# Patient Record
Sex: Female | Born: 1940
Health system: Southern US, Community
[De-identification: ages and names within clinical notes are randomized; demographics above are authoritative.]

## PROBLEM LIST (undated history)

## (undated) DIAGNOSIS — E079 Disorder of thyroid, unspecified: Secondary | ICD-10-CM

## (undated) DIAGNOSIS — H269 Unspecified cataract: Secondary | ICD-10-CM

## (undated) DIAGNOSIS — I10 Essential (primary) hypertension: Secondary | ICD-10-CM

## (undated) DIAGNOSIS — E785 Hyperlipidemia, unspecified: Secondary | ICD-10-CM

## (undated) HISTORY — DX: Disorder of thyroid, unspecified: E07.9

## (undated) HISTORY — DX: Hyperlipidemia, unspecified: E78.5

## (undated) HISTORY — PX: HERNIA REPAIR: SHX51

## (undated) HISTORY — DX: Unspecified cataract: H26.9

## (undated) HISTORY — DX: Essential (primary) hypertension: I10

## (undated) HISTORY — PX: APPENDECTOMY: SHX54

## (undated) HISTORY — PX: BLADDER SURGERY: SHX569

## (undated) HISTORY — PX: ABDOMINAL HYSTERECTOMY: SHX81

## (undated) HISTORY — PX: EYE SURGERY: SHX253

---

## 2004-10-14 ENCOUNTER — Ambulatory Visit: Payer: Self-pay | Admitting: Family Medicine

## 2004-11-19 ENCOUNTER — Ambulatory Visit: Payer: Self-pay | Admitting: Family Medicine

## 2005-05-08 ENCOUNTER — Ambulatory Visit: Payer: Self-pay | Admitting: Family Medicine

## 2005-08-05 ENCOUNTER — Ambulatory Visit: Payer: Self-pay | Admitting: Family Medicine

## 2005-11-10 ENCOUNTER — Ambulatory Visit: Payer: Self-pay | Admitting: Family Medicine

## 2006-02-09 ENCOUNTER — Ambulatory Visit: Payer: Self-pay | Admitting: Family Medicine

## 2006-08-10 ENCOUNTER — Ambulatory Visit: Payer: Self-pay | Admitting: Family Medicine

## 2006-08-11 ENCOUNTER — Ambulatory Visit: Payer: Self-pay | Admitting: Family Medicine

## 2006-10-01 ENCOUNTER — Ambulatory Visit: Payer: Self-pay | Admitting: Family Medicine

## 2007-03-01 ENCOUNTER — Ambulatory Visit: Payer: Self-pay | Admitting: Family Medicine

## 2019-06-13 ENCOUNTER — Other Ambulatory Visit: Payer: Self-pay

## 2019-06-14 ENCOUNTER — Ambulatory Visit (INDEPENDENT_AMBULATORY_CARE_PROVIDER_SITE_OTHER): Payer: Medicare Other | Admitting: Physician Assistant

## 2019-06-14 ENCOUNTER — Encounter: Payer: Self-pay | Admitting: Physician Assistant

## 2019-06-14 VITALS — BP 127/69 | HR 98 | Temp 99.4°F | Ht 66.0 in | Wt 174.8 lb

## 2019-06-14 DIAGNOSIS — E039 Hypothyroidism, unspecified: Secondary | ICD-10-CM

## 2019-06-14 DIAGNOSIS — I10 Essential (primary) hypertension: Secondary | ICD-10-CM | POA: Insufficient documentation

## 2019-06-14 DIAGNOSIS — E785 Hyperlipidemia, unspecified: Secondary | ICD-10-CM | POA: Insufficient documentation

## 2019-06-14 MED ORDER — LISINOPRIL-HYDROCHLOROTHIAZIDE 20-25 MG PO TABS: 0.5000 | ORAL_TABLET | Freq: Every day | ORAL | 3 refills | Status: DC

## 2019-06-14 NOTE — Progress Notes (Signed)
BP 127/69   Pulse 98   Temp 99.4 F (37.4 C) (Oral)   Ht 5\' 6"  (1.676 m)   Wt 174 lb 12.8 oz (79.3 kg)   BMI 28.21 kg/m    Subjective:    Patient ID: Hannah [name], female    DOB: 1941/05/05, 78 y.o.   MRN: [id number]  HPI: Hannah [name] is a 78 y.o. female presenting on 06/14/2019 for New Patient (Initial Visit) and Establish Care  Hannah [name] comes in as a new patient to be established with our practice.  She has been a longtime patient of Dr. Lysbeth Galas.  Her past medical history is positive for hyperlipidemia, hypothyroidism, hypertension.  All of her numbers have looked very good.  I have reviewed her labs from May and at that time they were quite good.  We will plan to recheck her again in November or December of this year.  Her past medical history is positive for hysterectomy, bladder tack and appendectomy.  She states that overall she does very well and does not have any difficulty with chest pain, shortness of breath, fatigue.  She sleeps well.  And she tries to stay active.  Past Medical History:  Diagnosis Date  . Hyperlipidemia   . Hypertension   . Thyroid disease    Relevant past medical, surgical, family and social history reviewed and updated as indicated. Interim medical history since our last visit reviewed. Allergies and medications reviewed and updated. DATA REVIEWED: CHART IN EPIC  Family History reviewed for pertinent findings.  Review of Systems  Constitutional: Negative.   HENT: Negative.   Eyes: Negative.   Respiratory: Negative.  Negative for shortness of breath and wheezing.   Gastrointestinal: Negative.   Genitourinary: Negative.   Musculoskeletal: Negative.   Psychiatric/Behavioral: Negative for decreased concentration, dysphoric mood and sleep disturbance. The patient is not nervous/anxious.     Allergies as of 06/14/2019      Reactions   Penicillins Rash   Sulfa Antibiotics Rash      Medication List       Accurate as of June 14, 2019 12:45  PM. If you have any questions, ask your nurse or doctor.        atorvastatin 10 MG tablet Commonly known as: LIPITOR Take 10 mg by mouth daily.   Calcium Carb-Cholecalciferol 600-400 MG-UNIT Tabs Take by mouth.   lisinopril-hydrochlorothiazide 20-25 MG tablet Commonly known as: ZESTORETIC Take 0.5 tablets by mouth daily.   Synthroid 75 MCG tablet Generic drug: levothyroxine TAKE ONE TABLET (75 MCG DOSE) BY MOUTH DAILY.          Objective:    BP 127/69   Pulse 98   Temp 99.4 F (37.4 C) (Oral)   Ht 5\' 6"  (1.676 m)   Wt 174 lb 12.8 oz (79.3 kg)   BMI 28.21 kg/m   Allergies  Allergen Reactions  . Penicillins Rash  . Sulfa Antibiotics Rash    Wt Readings from Last 3 Encounters:  06/14/19 174 lb 12.8 oz (79.3 kg)    Physical Exam Constitutional:      Appearance: She is well-developed.  HENT:     Head: Normocephalic and atraumatic.  Eyes:     Conjunctiva/sclera: Conjunctivae normal.     Pupils: Pupils are equal, round, and reactive to light.  Cardiovascular:     Rate and Rhythm: Normal rate and regular rhythm.     Heart sounds: Normal heart sounds.  Pulmonary:     Effort: Pulmonary effort is  normal.     Breath sounds: Normal breath sounds.  Abdominal:     General: Bowel sounds are normal.     Palpations: Abdomen is soft.  Skin:    General: Skin is warm and dry.     Findings: No rash.  Neurological:     Mental Status: She is alert and oriented to person, place, and time.     Deep Tendon Reflexes: Reflexes are normal and symmetric.  Psychiatric:        Behavior: Behavior normal.        Thought Content: Thought content normal.        Judgment: Judgment normal.     No results found for this or any previous visit.    Assessment & Plan:   1. Hyperlipidemia, unspecified hyperlipidemia type - CBC with Differential/Platelet; Future - CMP14+EGFR; Future - Lipid panel; Future - TSH; Future  2. Acquired hypothyroidism - TSH; Future  3. Essential  hypertension - CBC with Differential/Platelet; Future - CMP14+EGFR; Future   Continue all other maintenance medications as listed above.  Follow up plan: Return in about 5 months (around 11/14/2019).  Educational handout given for survey  Remus Loffler PA-C [facility] [address]  [place], [place] [address] [phone]   06/14/2019, 12:45 PM

## 2019-06-20 ENCOUNTER — Ambulatory Visit
Admission: EM | Admit: 2019-06-20 | Discharge: 2019-06-20 | Disposition: A | Discharge status: Home / Self Care | Payer: Medicare Other | Source: Home / Self Care

## 2019-06-20 ENCOUNTER — Emergency Department (HOSPITAL_COMMUNITY): Payer: Medicare Other

## 2019-06-20 ENCOUNTER — Observation Stay (HOSPITAL_COMMUNITY): Payer: Medicare Other

## 2019-06-20 ENCOUNTER — Encounter (HOSPITAL_COMMUNITY): Payer: Self-pay

## 2019-06-20 ENCOUNTER — Inpatient Hospital Stay (HOSPITAL_COMMUNITY)
Admission: EM | Admit: 2019-06-20 | Discharge: 2019-06-25 | DRG: 354 | Disposition: A | Discharge status: Home Health | Payer: Medicare Other | Attending: Internal Medicine | Admitting: Internal Medicine

## 2019-06-20 ENCOUNTER — Other Ambulatory Visit: Payer: Self-pay

## 2019-06-20 DIAGNOSIS — I959 Hypotension, unspecified: Secondary | ICD-10-CM | POA: Diagnosis present

## 2019-06-20 DIAGNOSIS — K429 Umbilical hernia without obstruction or gangrene: Secondary | ICD-10-CM

## 2019-06-20 DIAGNOSIS — R109 Unspecified abdominal pain: Secondary | ICD-10-CM

## 2019-06-20 DIAGNOSIS — Z20828 Contact with and (suspected) exposure to other viral communicable diseases: Secondary | ICD-10-CM | POA: Diagnosis present

## 2019-06-20 DIAGNOSIS — E876 Hypokalemia: Secondary | ICD-10-CM | POA: Diagnosis present

## 2019-06-20 DIAGNOSIS — Z9049 Acquired absence of other specified parts of digestive tract: Secondary | ICD-10-CM

## 2019-06-20 DIAGNOSIS — Z880 Allergy status to penicillin: Secondary | ICD-10-CM

## 2019-06-20 DIAGNOSIS — Z90710 Acquired absence of both cervix and uterus: Secondary | ICD-10-CM

## 2019-06-20 DIAGNOSIS — N179 Acute kidney failure, unspecified: Secondary | ICD-10-CM | POA: Diagnosis not present

## 2019-06-20 DIAGNOSIS — K56609 Unspecified intestinal obstruction, unspecified as to partial versus complete obstruction: Secondary | ICD-10-CM

## 2019-06-20 DIAGNOSIS — Z79890 Hormone replacement therapy (postmenopausal): Secondary | ICD-10-CM

## 2019-06-20 DIAGNOSIS — I10 Essential (primary) hypertension: Secondary | ICD-10-CM | POA: Diagnosis present

## 2019-06-20 DIAGNOSIS — E785 Hyperlipidemia, unspecified: Secondary | ICD-10-CM | POA: Diagnosis present

## 2019-06-20 DIAGNOSIS — Z8249 Family history of ischemic heart disease and other diseases of the circulatory system: Secondary | ICD-10-CM

## 2019-06-20 DIAGNOSIS — E039 Hypothyroidism, unspecified: Secondary | ICD-10-CM | POA: Diagnosis present

## 2019-06-20 DIAGNOSIS — K420 Umbilical hernia with obstruction, without gangrene: Principal | ICD-10-CM | POA: Diagnosis present

## 2019-06-20 DIAGNOSIS — R0902 Hypoxemia: Secondary | ICD-10-CM | POA: Diagnosis present

## 2019-06-20 DIAGNOSIS — R1032 Left lower quadrant pain: Secondary | ICD-10-CM

## 2019-06-20 DIAGNOSIS — Z882 Allergy status to sulfonamides status: Secondary | ICD-10-CM

## 2019-06-20 DIAGNOSIS — Z79899 Other long term (current) drug therapy: Secondary | ICD-10-CM

## 2019-06-20 DIAGNOSIS — Z823 Family history of stroke: Secondary | ICD-10-CM

## 2019-06-20 LAB — COMPREHENSIVE METABOLIC PANEL
ALT: 18 U/L (ref 0–44)
AST: 26 U/L (ref 15–41)
Albumin: 5 g/dL (ref 3.5–5.0)
Alkaline Phosphatase: 79 U/L (ref 38–126)
Anion gap: 15 (ref 5–15)
BUN: 47 mg/dL — ABNORMAL HIGH (ref 8–23)
CO2: 30 mmol/L (ref 22–32)
Calcium: 10.7 mg/dL — ABNORMAL HIGH (ref 8.9–10.3)
Chloride: 96 mmol/L — ABNORMAL LOW (ref 98–111)
Creatinine, Ser: 2.9 mg/dL — ABNORMAL HIGH (ref 0.44–1.00)
GFR calc Af Amer: 17 mL/min — ABNORMAL LOW (ref >60)
GFR calc non Af Amer: 15 mL/min — ABNORMAL LOW (ref >60)
Glucose, Bld: 168 mg/dL — ABNORMAL HIGH (ref 70–99)
Potassium: 3.5 mmol/L (ref 3.5–5.1)
Sodium: 141 mmol/L (ref 135–145)
Total Bilirubin: 1.5 mg/dL — ABNORMAL HIGH (ref 0.3–1.2)
Total Protein: 8.7 g/dL — ABNORMAL HIGH (ref 6.5–8.1)

## 2019-06-20 LAB — CBC
HCT: 47.8 % — ABNORMAL HIGH (ref 36.0–46.0)
Hemoglobin: 15.8 g/dL — ABNORMAL HIGH (ref 12.0–15.0)
MCH: 29 pg (ref 26.0–34.0)
MCHC: 33.1 g/dL (ref 30.0–36.0)
MCV: 87.7 fL (ref 80.0–100.0)
Platelets: 254 10*3/uL (ref 150–400)
RBC: 5.45 MIL/uL — ABNORMAL HIGH (ref 3.87–5.11)
RDW: 14.5 % (ref 11.5–15.5)
WBC: 11.1 10*3/uL — ABNORMAL HIGH (ref 4.0–10.5)
nRBC: 0 % (ref 0.0–0.2)

## 2019-06-20 LAB — LIPASE, BLOOD: Lipase: 30 U/L (ref 11–51)

## 2019-06-20 LAB — SARS CORONAVIRUS 2 BY RT PCR (HOSPITAL ORDER, PERFORMED IN ~~LOC~~ HOSPITAL LAB): SARS Coronavirus 2: NEGATIVE

## 2019-06-20 MED ORDER — SODIUM CHLORIDE 0.9 % IV BOLUS
500.0000 mL | Freq: Once | INTRAVENOUS | Status: AC
  Administered 2019-06-20: 21:00:00 500 mL via INTRAVENOUS

## 2019-06-20 MED ORDER — MORPHINE SULFATE (PF) 2 MG/ML IV SOLN
2.0000 mg | INTRAVENOUS | Status: DC | PRN
  Administered 2019-06-21 – 2019-06-25 (×7): 2 mg via INTRAVENOUS
  Filled 2019-06-20 (×7): qty 1

## 2019-06-20 MED ORDER — KCL IN DEXTROSE-NACL 20-5-0.9 MEQ/L-%-% IV SOLN
INTRAVENOUS | Status: DC
  Administered 2019-06-21 – 2019-06-23 (×5): via INTRAVENOUS
  Filled 2019-06-20 (×2): qty 1000

## 2019-06-20 MED ORDER — SODIUM CHLORIDE 0.9 % IV BOLUS
500.0000 mL | Freq: Once | INTRAVENOUS | Status: AC
  Administered 2019-06-20: 500 mL via INTRAVENOUS

## 2019-06-20 MED ORDER — TECHNETIUM TO 99M ALBUMIN AGGREGATED
1.6000 | Freq: Once | INTRAVENOUS | Status: AC | PRN
  Administered 2019-06-20: 1.6 via INTRAVENOUS

## 2019-06-20 MED ORDER — ACETAMINOPHEN 325 MG PO TABS: 650.0000 mg | ORAL_TABLET | Freq: Four times a day (QID) | ORAL | Status: DC | PRN

## 2019-06-20 MED ORDER — SODIUM CHLORIDE 0.9% FLUSH: 3.0000 mL | Freq: Once | INTRAVENOUS | Status: DC

## 2019-06-20 MED ORDER — HEPARIN (PORCINE) 25000 UT/250ML-% IV SOLN
1050.0000 [IU]/h | INTRAVENOUS | Status: AC
  Administered 2019-06-20: 22:00:00 1200 [IU]/h via INTRAVENOUS
  Administered 2019-06-21: 1050 [IU]/h via INTRAVENOUS
  Filled 2019-06-20 (×2): qty 250

## 2019-06-20 MED ORDER — FENTANYL CITRATE (PF) 100 MCG/2ML IJ SOLN: 50.0000 ug | Freq: Once | INTRAMUSCULAR | Status: DC

## 2019-06-20 MED ORDER — ONDANSETRON HCL 4 MG PO TABS
4.0000 mg | ORAL_TABLET | Freq: Four times a day (QID) | ORAL | Status: DC | PRN
  Administered 2019-06-24: 4 mg via ORAL
  Filled 2019-06-20 (×2): qty 1

## 2019-06-20 MED ORDER — ONDANSETRON HCL 4 MG/2ML IJ SOLN
4.0000 mg | Freq: Once | INTRAMUSCULAR | Status: AC
  Administered 2019-06-20: 4 mg via INTRAVENOUS
  Filled 2019-06-20: qty 2

## 2019-06-20 MED ORDER — ACETAMINOPHEN 650 MG RE SUPP: 650.0000 mg | Freq: Four times a day (QID) | RECTAL | Status: DC | PRN

## 2019-06-20 MED ORDER — HEPARIN BOLUS VIA INFUSION
4000.0000 [IU] | Freq: Once | INTRAVENOUS | Status: AC
  Administered 2019-06-20: 22:00:00 4000 [IU] via INTRAVENOUS

## 2019-06-20 MED ORDER — FENTANYL CITRATE (PF) 100 MCG/2ML IJ SOLN
50.0000 ug | Freq: Once | INTRAMUSCULAR | Status: AC
  Administered 2019-06-20: 50 ug via INTRAVENOUS
  Filled 2019-06-20: qty 2

## 2019-06-20 MED ORDER — IOHEXOL 300 MG/ML  SOLN
30.0000 mL | Freq: Once | INTRAMUSCULAR | Status: AC | PRN
  Administered 2019-06-20: 18:00:00 30 mL via ORAL

## 2019-06-20 MED ORDER — ONDANSETRON HCL 4 MG/2ML IJ SOLN
4.0000 mg | Freq: Four times a day (QID) | INTRAMUSCULAR | Status: DC | PRN
  Administered 2019-06-21 – 2019-06-23 (×5): 4 mg via INTRAVENOUS
  Filled 2019-06-20 (×7): qty 2

## 2019-06-20 MED ORDER — SODIUM CHLORIDE 0.9 % IV SOLN
Freq: Once | INTRAVENOUS | Status: AC
  Administered 2019-06-20: 20:00:00 via INTRAVENOUS

## 2019-06-20 NOTE — Discharge Instructions (Addendum)
Recommending further evaluation and management in the ED.  Cannot rule out incarcerated hernia, bowel obstruction, or GI bleed in office.  Patient and family declines EMS transport.  Will go by private vehicle to [facility] ED.  Pt stable at discharge.

## 2019-06-20 NOTE — ED Notes (Signed)
Pt lips are mildly cyanotic, pulse ox 90%. Pt placed on 2L [place]. [place] PA is speaking with family.

## 2019-06-20 NOTE — ED Notes (Signed)
Patient is being discharged from the Urgent [place] and sent to the Emergency Department by family member. EMS offered, family and patient declined. Per [place] PA, patient is stable but in need of higher level of care due to abdominal pain, low oxygen levels, emesis, and no bowel movement x 5 days. Patient is aware and verbalizes understanding of plan of care.  Vitals:   06/20/19 1350 06/20/19 1358  BP: 95/64   Pulse: (!) 125   Resp: 19   Temp: 98.5 F (36.9 C)   SpO2: 93% 95%

## 2019-06-20 NOTE — ED Notes (Signed)
Pt states she does not need fentanyl at this time.

## 2019-06-20 NOTE — ED Notes (Signed)
Patient's family notified that patient is going to be admitted.

## 2019-06-20 NOTE — ED Notes (Signed)
Admitting physician reduced oxygen to 2 liters by nasal cannula. Patient's oxygen saturation is at 97 percent on 2 liters

## 2019-06-20 NOTE — ED Provider Notes (Signed)
  [place]   [id number] 06/20/19 Arrival Time: 7741  Abbreviated note:    CC: ABDOMINAL pain  SUBJECTIVE:  Hannah [name] is a 78 y.o. female hx significant for HLD, HTN, and thyroid disease, who presents with complaint of abdominal pain and distention x 5 days.  Denies a precipitating event, or trauma.  Localizes pain to LLQ. Describes as stabbing, and 8/10.  Has tried OTC medications without relief.  Symptoms made worse with eating/ drinking.  Last BM 5 days ago. Complains of nausea, vomiting.    Denies chest pain or SOB.    No LMP recorded. Patient has had a hysterectomy.  ROS: As per HPI.  OBJECTIVE:  Vitals:   06/20/19 1350 06/20/19 1358  BP: 95/64   Pulse: (!) 125   Resp: 19   Temp: 98.5 F (36.9 C)   SpO2: 93% 95%    General appearance: Alert; appears uncomfortable HEENT: NCAT. Lips mildly cyanotic.  Oropharynx clear. Voice is hoarse; voice and O2 stats improved after 2L of oxygen by [place] Lungs: clear to auscultation bilaterally without adventitious breath sounds Heart: tachycardia, regular rhythm Abdomen: distended; TTP over lower abdomen; umbilical hernia with discoloration; mild guarding Extremities: no edema; symmetrical with no gross deformities Skin: warm and dry Neurologic: normal gait Psychological: alert and cooperative; normal mood and affect  ASSESSMENT & PLAN:  1. Left lower quadrant abdominal pain   2. Umbilical hernia without obstruction and without gangrene    Recommending further evaluation and management in the ED.  Cannot rule out incarcerated hernia, bowel obstruction, or GI bleed in office.  Patient and family declines EMS transport.  Will go by private vehicle to [facility] ED.  Pt stable at discharge.      Lestine Box, PA-C 06/20/19 1421

## 2019-06-20 NOTE — ED Triage Notes (Signed)
Pt sent to ED from Urgent Care for evaluation of mid abdominal pain. Pt was seen today and diagnosed with umbilical hernia but could not rule out bowel obstruction or GI bleed. Pt with complaints of abdominal pain, vomiting and no bowel movement in 5 days. Pt abdomen noted to be distended.

## 2019-06-20 NOTE — Progress Notes (Signed)
ANTICOAGULATION CONSULT NOTE - Initial Consult  Pharmacy Consult for heparin Indication: potential PE  Allergies  Allergen Reactions  . Penicillins Rash    Did it involve swelling of the face/tongue/throat, SOB, or low BP? Unknown Did it involve sudden or severe rash/hives, skin peeling, or any reaction on the inside of your mouth or nose? Janifer Adie Did you need to seek medical attention at a hospital or doctor's office? No When did it last happen?childhood If all above answers are "NO", may proceed with cephalosporin use.   . Sulfa Antibiotics Rash    Patient Measurements: Height: 5\' 6"  (167.6 cm) Weight: 174 lb (78.9 kg) IBW/kg (Calculated) : 59.3 Heparin Dosing Weight: 75 kg   Vital Signs: Temp: 98.4 F (36.9 C) (07/20 1423) Temp Source: Oral (07/20 1423) BP: 94/47 (07/20 2100) Pulse Rate: 97 (07/20 2120)  Labs: Recent Labs    06/20/19 1516  HGB 15.8*  HCT 47.8*  PLT 254  CREATININE 2.90*    Estimated Creatinine Clearance: 16.9 mL/min (A) (by C-G formula based on SCr of 2.9 mg/dL (H)).   Medical History: Past Medical History:  Diagnosis Date  . Hyperlipidemia   . Hypertension   . Thyroid disease     Medications:  (Not in a hospital admission)   Assessment: Pharmacy consulted to dose heparin in patient with potential PE (VQ scan pending).  Patient not on anticoagulation prior to admission. Goal of Therapy:  Heparin level 0.3-0.7 units/ml Monitor platelets by anticoagulation protocol: Yes   Plan:  Give 4000 units bolus x 1 Start heparin infusion at 1200 units/hr Check anti-Xa level in 8 hours and daily while on heparin Continue to monitor H&H and platelets  Ramond Craver 06/20/2019,9:32 PM

## 2019-06-20 NOTE — ED Notes (Signed)
As reported by day time RN urgent care center reported patient's sats at 90-91 percent . Patient arrived to the ED with sats on room air in the mid 80 range. Patient was hypoxic and lips were blue in color. Hypoxia resolved after patient was placed on 4 liters nasal cannula.

## 2019-06-20 NOTE — ED Notes (Signed)
Hourly rounding complete. Patient states that she is not having any pain but was having nausea and dry heaving,. Patient was given standing order for zofran.

## 2019-06-20 NOTE — ED Notes (Signed)
Report given to Diamond at [facility] ED.

## 2019-06-20 NOTE — ED Notes (Signed)
Patient is being discharged to the ED by family. Pt and family offered EMS transport. They declined requesting that they transport her directly to the ED in personal vehicle.

## 2019-06-20 NOTE — ED Triage Notes (Addendum)
Pt presents with complaints of abdominal pain, emesis and no bowel movement x 5 days. Pts abdomen is distended. Area around umbilicus is swollen and red, patient denies hx of hernia to her knowledge. [place] PA notified of complaints and is at bedside.  Pt's voice is hoarse, states she woke up with that this morning. Denies any cough, fever or shortness of breath.

## 2019-06-20 NOTE — ED Provider Notes (Addendum)
[facility]GENCY [facility]PART[facility] Provider Note   CSN: [id number] Arrival date & time: 06/20/19  1413    History   Chief Complaint Chief Complaint  Patient presents with  . Abdominal Pain    HPI Hannah [name] is a 78 y.o. female.     Generalized abdominal pain for 2 weeks, worse this weekend, localizing in the left lower quadrant with associated bulging in the periumbilical area.  Past surgical history includes appendectomy and hysterectomy.  No previous renal problems.  She has been nauseated.  Pulse ox noted to be 74% on room air.  No frank dyspnea, chest pain, cough, fever, sweats, chills, dysuria, bloody stool.  Severity is moderate.  Palpation makes symptoms worse     Past Medical History:  Diagnosis Date  . Hyperlipidemia   . Hypertension   . Thyroid disease     Patient Active Problem List   Diagnosis Date Noted  . Hyperlipidemia 06/14/2019  . Acquired hypothyroidism 06/14/2019  . Essential hypertension 06/14/2019    Past Surgical History:  Procedure Laterality Date  . ABDOMINAL HYSTERECTOMY    . APPENDECTOMY    . BLADDER SURGERY    . EYE SURGERY       OB History   No obstetric history on file.      Home Medications    Prior to Admission medications   Medication Sig Start Date End Date Taking? Authorizing Provider  atorvastatin (LIPITOR) 10 MG tablet Take 10 mg by mouth every morning.  03/26/19  Yes [provider]  Calcium Carb-Cholecalciferol 600-400 MG-UNIT TABS Take 1 tablet by mouth daily.    Yes [provider]  lisinopril-hydrochlorothiazide (ZESTORETIC) 20-25 MG tablet Take 0.5 tablets by mouth daily. 06/14/19  Yes Prudy FeelerJones, Angel S, PA-C  SYNTHROID 75 MCG tablet Take 75 mcg by mouth every morning.  03/16/19  Yes [provider]    Family History Family History  Problem Relation Age of Onset  . Stroke Mother   . Heart disease Mother   . Heart disease Father   . Stroke Father   . Cancer Father   . Cancer Sister    . Aneurysm Brother     Social History Social History   Tobacco Use  . Smoking status: Never Smoker  . Smokeless tobacco: Never Used  Substance Use Topics  . Alcohol use: Never    Frequency: Never  . Drug use: Never     Allergies   Penicillins and Sulfa antibiotics   Review of Systems Review of Systems  All other systems reviewed and are negative.    Physical Exam Updated Vital Signs BP (!) 118/55 (BP Location: Right Arm)   Pulse (!) 102   Temp 98.4 F (36.9 C) (Oral)   Resp 18   Ht 5\' 6"  (1.676 m)   Wt 78.9 kg   SpO2 95%   BMI 28.08 kg/m   Physical Exam Vitals signs and nursing note reviewed.  Constitutional:      Appearance: She is well-developed.  HENT:     Head: Normocephalic and atraumatic.  Eyes:     Conjunctiva/sclera: Conjunctivae normal.  Neck:     Musculoskeletal: Neck supple.  Cardiovascular:     Rate and Rhythm: Normal rate and regular rhythm.  Pulmonary:     Effort: Pulmonary effort is normal.     Breath sounds: Normal breath sounds.  Abdominal:     General: Bowel sounds are normal.     Palpations: Abdomen is soft.     Comments: Abdominal  protuberance; most tender in the left lower quadrant; suspected periumbilical hernia (unable to reduce manually)  Musculoskeletal: Normal range of motion.  Skin:    General: Skin is warm and dry.  Neurological:     Mental Status: She is alert and oriented to person, place, and time.  Psychiatric:        Behavior: Behavior normal.      ED Treatments / Results  Labs (all labs ordered are listed, but only abnormal results are displayed) Labs Reviewed  COMPREHENSIVE METABOLIC PANEL - Abnormal; Notable for the following components:      Result Value   Chloride 96 (*)    Glucose, Bld 168 (*)    BUN 47 (*)    Creatinine, Ser 2.90 (*)    Calcium 10.7 (*)    Total Protein 8.7 (*)    Total Bilirubin 1.5 (*)    GFR calc non Af Amer 15 (*)    GFR calc Af Amer 17 (*)    All other components within  normal limits  CBC - Abnormal; Notable for the following components:   WBC 11.1 (*)    RBC 5.45 (*)    Hemoglobin 15.8 (*)    HCT 47.8 (*)    All other components within normal limits  SARS CORONAVIRUS 2 (HOSPITAL ORDER, PERFORMED IN [facility])  LIPASE, BLOOD  URINALYSIS, ROUTINE W REFLEX MICROSCOPIC    EKG None  Radiology Ct Abdomen Pelvis Wo Contrast  Result Date: 06/20/2019 CLINICAL DATA:  Abdominal pain for several days EXAM: CT ABDOMEN AND PELVIS WITHOUT CONTRAST TECHNIQUE: Multidetector CT imaging of the abdomen and pelvis was performed following the standard protocol without IV contrast. COMPARISON:  None. FINDINGS: Lower chest: No acute abnormality. Hepatobiliary: Liver is well visualized within normal limits. The gallbladder is well distended with 2 heavily calcified gallstones. No wall thickening or pericholecystic fluid is noted. Pancreas: Unremarkable. No pancreatic ductal dilatation or surrounding inflammatory changes. Spleen: Normal in size without focal abnormality. Adrenals/Urinary Tract: Adrenal glands are within normal limits. Kidneys are well visualized bilaterally. No renal calculi or obstructive changes are seen. The bladder is well distended. Stomach/Bowel: Diverticular change of the colon is identified without inflammatory change. The colon is predominately decompressed however. The appendix has been surgically removed. There is an incarcerated loop of small bowel identified within an umbilical hernia with proximal small bowel dilatation extending from the stomach to the level of the hernia. The small bowel distal to the hernia is completely decompressed comprising almost the entire ileum. Vascular/Lymphatic: Aortic atherosclerosis. No enlarged abdominal or pelvic lymph nodes. Reproductive: Status post hysterectomy. No adnexal masses. Other: No abdominal wall hernia or abnormality. No abdominopelvic ascites. Musculoskeletal: No acute or significant osseous  findings. IMPRESSION: Incarcerated umbilical hernia containing a loop of small bowel likely near the junction of the jejunum and ileum with proximal small bowel obstruction. Cholelithiasis without complicating factors. Diverticulosis without diverticulitis. Electronically Signed   By: Alcide CleverMark  Lukens M.D.   On: 06/20/2019 18:17   Dg Chest Port 1 View  Result Date: 06/20/2019 CLINICAL DATA:  Abdominal pain.  Low O2 saturation. EXAM: PORTABLE CHEST 1 VIEW COMPARISON:  None. FINDINGS: The heart size and mediastinal contours are within normal limits. Both lungs are clear. The visualized skeletal structures are unremarkable. IMPRESSION: Normal exam. Electronically Signed   By: Francene BoyersJames  Maxwell M.D.   On: 06/20/2019 17:48    Procedures Hernia reduction  Date/Time: 06/20/2019 7:51 PM Performed by: Donnetta Hutchingook, Kischa Altice, MD Authorized by: Donnetta Hutchingook, Jarae Panas, MD  Consent: Verbal consent obtained. Risks and benefits: risks, benefits and alternatives were discussed Consent given by: patient Patient understanding: patient states understanding of the procedure being performed Test results: test results available and properly labeled Imaging studies: imaging studies available Patient identity confirmed: verbally with patient Local anesthesia used: no  Anesthesia: Local anesthesia used: no  Sedation: Patient sedated: yes Sedatives: fentanyl Analgesia: fentanyl Vitals: Vital signs were monitored during sedation.  Patient tolerance: patient tolerated the procedure well with no immediate complications Comments: Periumbilical hernia was reduced using general manual pressure.  Patient tolerated the procedure well.    (including critical care time)  Medications Ordered in ED Medications  sodium chloride flush (NS) 0.9 % injection 3 mL (3 mLs Intravenous Not Given 06/20/19 1541)  fentaNYL (SUBLIMAZE) injection 50 mcg (0 mcg Intravenous Hold 06/20/19 1915)  sodium chloride 0.9 % bolus 500 mL (has no administration in time  range)  0.9 %  sodium chloride infusion (has no administration in time range)  fentaNYL (SUBLIMAZE) injection 50 mcg (50 mcg Intravenous Given 06/20/19 1655)  ondansetron (ZOFRAN) injection 4 mg (4 mg Intravenous Given 06/20/19 1655)  iohexol (OMNIPAQUE) 300 MG/ML solution 30 mL (30 mLs Oral Contrast Given 06/20/19 1811)     Initial Impression / Assessment and Plan / ED Course  I have reviewed the triage vital signs and the nursing notes.  Pertinent labs & imaging results that were available during my care of the patient were reviewed by me and considered in my medical decision making (see chart for details).        Abdominal pain and swelling worse in the left lower quadrant.  Suspect bowel obstruction and possible hernia.  CT pending.  Uncertain etiology of low pulse ox.  Could be lab error.  Chest x-ray negative.  Creatinine 2.9  1820: CT reveals an incarcerated umbilical hernia with small bowel obstruction.  Will consult general surgery and admit.  8338: Periumbilical hernia reduced with gentle pressure.  CRITICAL CARE Performed by: Nat Christen Total critical care time: 30 minutes Critical care time was exclusive of separately billable procedures and treating other patients. Critical care was necessary to treat or prevent imminent or life-threatening deterioration. Critical care was time spent personally by me on the following activities: development of treatment plan with patient and/or surrogate as well as nursing, discussions with consultants, evaluation of patient's response to treatment, examination of patient, obtaining history from patient or surrogate, ordering and performing treatments and interventions, ordering and review of laboratory studies, ordering and review of radiographic studies, pulse oximetry and re-evaluation of patient's condition.  Final Clinical Impressions(s) / ED Diagnoses   Final diagnoses:  Abdominal pain, unspecified abdominal location  AKI (acute  kidney injury) ([place])  Incarcerated umbilical hernia  Small bowel obstruction [facility])    ED Discharge Orders    None       Nat Christen, MD 06/20/19 Isaias Cowman, MD 06/20/19 Kathaleen Maser    Nat Christen, MD 06/20/19 2026

## 2019-06-20 NOTE — ED Notes (Signed)
Pt attempting to drink po contrast. Denies any nausea, however vomits after about 286ml. MD made aware and decision to do scan without contrast made.

## 2019-06-20 NOTE — ED Notes (Signed)
Placed pt on monitor and sats noted to be 74% on room air. Pt denies any shortness of breath at this time other than feeling smothered by mask at times. Good pleth with this sat. Placed on 2L [place] and sats improved to 90%. MD made aware.

## 2019-06-20 NOTE — H&P (Signed)
History and Physical    Hannah [name] JXB:[id number] DOB: 05/12/1941 DOA: 06/20/2019  PCP: Remus Loffler, PA-C   Patient coming from: Home  I have personally briefly reviewed patient's old medical records in [facility]  Chief Complaint: Abdominal Pain, vomiting  HPI: Hannah [name] is a 78 y.o. female with medical history significant for hypertension, hyperlipidemia hypothyroidism who presented to the ED with complaints of abdominal pain and multiple episodes of vomiting over the past 5 days.  Patient last bowel movement was 5 days ago.  No fever no chills. She also reports a chronic intermittent dry cough that is unchanged, she denies difficulty breathing, chest pain.  She never smoked cigarettes.  Denies lower extremity swelling.  No history of blood clots in lungs or legs.  She is fairly active, lives alone, mows her own lawn. Daughter is an Nutritional therapist here at [employer]. Patient initially presented at the urgent care and was subsequently referred to the ED.  ED Course: Temperature 98.4, initial tachycardia to 122, intermittent tachypnea to 32, O2 sats 75% on room air, patient was placed on 4 L nasal cannula with improvement in O2 sats to greater than 94%.  WBC 11.  Potassium 3.4.  Elevated creatinine 2.9.  Chest x-ray negative for acute abnormality.  CT abdomen and pelvis without contrast-accelerated umbilical hernia containing a loop of small bowel likely near the junction of the jejunum and ileum with proximal small bowel obstruction.  Umbilical hernia was manually reduced in the ED. EDP talked to Dr. Lovell Sheehan who will see patient in the morning, recommended hospitalist admission.  Review of Systems: As per HPI all other systems reviewed and negative.  Past Medical History:  Diagnosis Date   Hyperlipidemia    Hypertension    Thyroid disease     Past Surgical History:  Procedure Laterality Date   ABDOMINAL HYSTERECTOMY     APPENDECTOMY     BLADDER SURGERY     EYE  SURGERY       reports that she has never smoked. She has never used smokeless tobacco. She reports that she does not drink alcohol or use drugs.  Allergies  Allergen Reactions   Penicillins Rash    Did it involve swelling of the face/tongue/throat, SOB, or low BP? Unknown Did it involve sudden or severe rash/hives, skin peeling, or any reaction on the inside of your mouth or nose? Ruta Hinds Did you need to seek medical attention at a hospital or doctor's office? No When did it last happen?childhood If all above answers are NO, may proceed with cephalosporin use.    Sulfa Antibiotics Rash    Family History  Problem Relation Age of Onset   Stroke Mother    Heart disease Mother    Heart disease Father    Stroke Father    Cancer Father    Cancer Sister    Aneurysm Brother     Prior to Admission medications   Medication Sig Start Date End Date Taking? Authorizing Provider  atorvastatin (LIPITOR) 10 MG tablet Take 10 mg by mouth every morning.  03/26/19  Yes [provider]  Calcium Carb-Cholecalciferol 600-400 MG-UNIT TABS Take 1 tablet by mouth daily.    Yes [provider]  lisinopril-hydrochlorothiazide (ZESTORETIC) 20-25 MG tablet Take 0.5 tablets by mouth daily. 06/14/19  Yes Prudy Feeler S, PA-C  SYNTHROID 75 MCG tablet Take 75 mcg by mouth every morning.  03/16/19  Yes [provider]    Physical Exam: Vitals:   06/20/19  1920 06/20/19 1930 06/20/19 2000 06/20/19 2030  BP:  93/74 (!) 108/53 (!) 122/58  Pulse: (!) 102 (!) 106 (!) 102 96  Resp: 18 (!) 23 (!) 27 (!) 23  Temp:      TempSrc:      SpO2: 95% 95% 96% 94%  Weight:      Height:        Constitutional: NAD, calm, comfortable Vitals:   06/20/19 1920 06/20/19 1930 06/20/19 2000 06/20/19 2030  BP:  93/74 (!) 108/53 (!) 122/58  Pulse: (!) 102 (!) 106 (!) 102 96  Resp: 18 (!) 23 (!) 27 (!) 23  Temp:      TempSrc:      SpO2: 95% 95% 96% 94%  Weight:      Height:        Eyes: PERRL, lids and conjunctivae normal ENMT: Mucous membranes are moist. Posterior pharynx clear of any exudate or lesions.Normal dentition.  Neck: normal, supple, no masses, no thyromegaly Respiratory: clear to auscultation bilaterally, no wheezing, no crackles. Normal respiratory effort. No accessory muscle use.  Cardiovascular: Regular rate and rhythm, no murmurs / rubs / gallops. No extremity edema. 2+ pedal pulses. No carotid bruits.  Abdomen: Mild to moderate abdominal tenderness on palpation mostly periumbilical area,  no masses palpated. No hepatosplenomegaly. Bowel sounds positive.  Musculoskeletal: no clubbing / cyanosis. No joint deformity upper and lower extremities. Good ROM, no contractures. Normal muscle tone.  Skin: no rashes, lesions, ulcers. No induration Neurologic: CN 2-12 grossly intact.  Strength 5/5 in all 4.  Psychiatric: Normal judgment and insight. Alert and oriented x 3. Normal mood.   Labs on Admission: I have personally reviewed following labs and imaging studies  CBC: Recent Labs  Lab 06/20/19 1516  WBC 11.1*  HGB 15.8*  HCT 47.8*  MCV 87.7  PLT 254   Basic Metabolic Panel: Recent Labs  Lab 06/20/19 1516  NA 141  K 3.5  CL 96*  CO2 30  GLUCOSE 168*  BUN 47*  CREATININE 2.90*  CALCIUM 10.7*   Liver Function Tests: Recent Labs  Lab 06/20/19 1516  AST 26  ALT 18  ALKPHOS 79  BILITOT 1.5*  PROT 8.7*  ALBUMIN 5.0   Recent Labs  Lab 06/20/19 1516  LIPASE 30    Radiological Exams on Admission: Ct Abdomen Pelvis Wo Contrast  Result Date: 06/20/2019 CLINICAL DATA:  Abdominal pain for several days EXAM: CT ABDOMEN AND PELVIS WITHOUT CONTRAST TECHNIQUE: Multidetector CT imaging of the abdomen and pelvis was performed following the standard protocol without IV contrast. COMPARISON:  None. FINDINGS: Lower chest: No acute abnormality. Hepatobiliary: Liver is well visualized within normal limits. The gallbladder is well distended with 2  heavily calcified gallstones. No wall thickening or pericholecystic fluid is noted. Pancreas: Unremarkable. No pancreatic ductal dilatation or surrounding inflammatory changes. Spleen: Normal in size without focal abnormality. Adrenals/Urinary Tract: Adrenal glands are within normal limits. Kidneys are well visualized bilaterally. No renal calculi or obstructive changes are seen. The bladder is well distended. Stomach/Bowel: Diverticular change of the colon is identified without inflammatory change. The colon is predominately decompressed however. The appendix has been surgically removed. There is an incarcerated loop of small bowel identified within an umbilical hernia with proximal small bowel dilatation extending from the stomach to the level of the hernia. The small bowel distal to the hernia is completely decompressed comprising almost the entire ileum. Vascular/Lymphatic: Aortic atherosclerosis. No enlarged abdominal or pelvic lymph nodes. Reproductive: Status post hysterectomy. No adnexal  masses. Other: No abdominal wall hernia or abnormality. No abdominopelvic ascites. Musculoskeletal: No acute or significant osseous findings. IMPRESSION: Incarcerated umbilical hernia containing a loop of small bowel likely near the junction of the jejunum and ileum with proximal small bowel obstruction. Cholelithiasis without complicating factors. Diverticulosis without diverticulitis. Electronically Signed   By: Alcide Clever M.D.   On: 06/20/2019 18:17   Dg Chest Port 1 View  Result Date: 06/20/2019 CLINICAL DATA:  Abdominal pain.  Low O2 saturation. EXAM: PORTABLE CHEST 1 VIEW COMPARISON:  None. FINDINGS: The heart size and mediastinal contours are within normal limits. Both lungs are clear. The visualized skeletal structures are unremarkable. IMPRESSION: Normal exam. Electronically Signed   By: Francene Boyers M.D.   On: 06/20/2019 17:48    EKG: None.  Assessment/Plan Active Problems:   AKI (acute kidney  injury) (HCC)    Acute kidney injury- creatinine 2.9, baseline ~0.9 (per care everywhere).  With hypotension.  Likely prerenal from vomiting, poor p.o. intake and ongoing lisinopril- HCTZ use.  bolus given in ED. -Repeat bolus , Cont D5 N/s+ 20 KCL 100cc/hr  - BMP a.m -Hold home lisinopril HCTZ  Hypoxia- O2 sat 75% as documented in the ED.  With tachypnea to 33, intermittent tachycardia, and soft blood pressure. No significant respiratory symptoms to explain hypoxia.  No chest pain.  No PE or DVT history.  Portable chest x-ray without acute abnormality.  No history of cigarette smoking.  On my evaluation, I reduced O2 from 4 to 2 L, O2 sats dropped from 100% to 93- 94%. -With acute kidney injury, will order VQ scan to rule out PE -Will start Heparin drip.  Incarcerated umbilical hernia-manually reduced in the ED.  Abdominal CT showed incarcerated umbilical hernia containing a loop of small bowel likely near the junction of the jejunum and ileum with proximal small bowel obstruction. EDP talked to Dr. Lovell Sheehan who will see in a.m. -I talked to Dr. Lovell Sheehan about starting anticoagulation on patient considering she may need surgery, he agreed with anticoagulation at this time with heparin, he will see patient in a.m. -BMP CBC a.m. -N.p.o.  Hypertension-soft to stable blood pressure systolic down to 40/98.  500 mill bolus given -Repeat 500 mill bolus, hydrate -Hold home lisinopril HCTZ  Hypothyroidism -Hold home Synthroid for now while n.p.o.   DVT prophylaxis: SCDs Code Status: Full Family Communication: Daughter present at bedside, she is a Engineer, civil (consulting) here at [employer], ED. Disposition Plan: Per rounding team Consults called: None Admission status: Observation, telemetry  Mayank Teuscher Wendall Stade MD [facility]  06/20/2019, 9:20 PM

## 2019-06-21 DIAGNOSIS — Z8249 Family history of ischemic heart disease and other diseases of the circulatory system: Secondary | ICD-10-CM | POA: Diagnosis not present

## 2019-06-21 DIAGNOSIS — Z9049 Acquired absence of other specified parts of digestive tract: Secondary | ICD-10-CM | POA: Diagnosis not present

## 2019-06-21 DIAGNOSIS — Z79899 Other long term (current) drug therapy: Secondary | ICD-10-CM | POA: Diagnosis not present

## 2019-06-21 DIAGNOSIS — K420 Umbilical hernia with obstruction, without gangrene: Secondary | ICD-10-CM | POA: Diagnosis present

## 2019-06-21 DIAGNOSIS — E876 Hypokalemia: Secondary | ICD-10-CM | POA: Diagnosis present

## 2019-06-21 DIAGNOSIS — Z882 Allergy status to sulfonamides status: Secondary | ICD-10-CM | POA: Diagnosis not present

## 2019-06-21 DIAGNOSIS — R109 Unspecified abdominal pain: Secondary | ICD-10-CM | POA: Diagnosis not present

## 2019-06-21 DIAGNOSIS — R7989 Other specified abnormal findings of blood chemistry: Secondary | ICD-10-CM | POA: Diagnosis not present

## 2019-06-21 DIAGNOSIS — J9621 Acute and chronic respiratory failure with hypoxia: Secondary | ICD-10-CM | POA: Diagnosis not present

## 2019-06-21 DIAGNOSIS — E785 Hyperlipidemia, unspecified: Secondary | ICD-10-CM | POA: Diagnosis present

## 2019-06-21 DIAGNOSIS — Z79890 Hormone replacement therapy (postmenopausal): Secondary | ICD-10-CM | POA: Diagnosis not present

## 2019-06-21 DIAGNOSIS — I959 Hypotension, unspecified: Secondary | ICD-10-CM | POA: Diagnosis present

## 2019-06-21 DIAGNOSIS — Z823 Family history of stroke: Secondary | ICD-10-CM | POA: Diagnosis not present

## 2019-06-21 DIAGNOSIS — I2699 Other pulmonary embolism without acute cor pulmonale: Secondary | ICD-10-CM | POA: Diagnosis not present

## 2019-06-21 DIAGNOSIS — E039 Hypothyroidism, unspecified: Secondary | ICD-10-CM | POA: Diagnosis present

## 2019-06-21 DIAGNOSIS — N179 Acute kidney failure, unspecified: Secondary | ICD-10-CM | POA: Diagnosis present

## 2019-06-21 DIAGNOSIS — Z880 Allergy status to penicillin: Secondary | ICD-10-CM | POA: Diagnosis not present

## 2019-06-21 DIAGNOSIS — Z20828 Contact with and (suspected) exposure to other viral communicable diseases: Secondary | ICD-10-CM | POA: Diagnosis present

## 2019-06-21 DIAGNOSIS — I10 Essential (primary) hypertension: Secondary | ICD-10-CM | POA: Diagnosis present

## 2019-06-21 DIAGNOSIS — Z90710 Acquired absence of both cervix and uterus: Secondary | ICD-10-CM | POA: Diagnosis not present

## 2019-06-21 DIAGNOSIS — R0902 Hypoxemia: Secondary | ICD-10-CM | POA: Diagnosis present

## 2019-06-21 HISTORY — DX: Umbilical hernia with obstruction, without gangrene: K42.0

## 2019-06-21 LAB — CBC
HCT: 41.2 % (ref 36.0–46.0)
Hemoglobin: 13.6 g/dL (ref 12.0–15.0)
MCH: 29.1 pg (ref 26.0–34.0)
MCHC: 33 g/dL (ref 30.0–36.0)
MCV: 88 fL (ref 80.0–100.0)
Platelets: 170 10*3/uL (ref 150–400)
RBC: 4.68 MIL/uL (ref 3.87–5.11)
RDW: 14.5 % (ref 11.5–15.5)
WBC: 9.4 10*3/uL (ref 4.0–10.5)
nRBC: 0 % (ref 0.0–0.2)

## 2019-06-21 LAB — BASIC METABOLIC PANEL
Anion gap: 14 (ref 5–15)
BUN: 54 mg/dL — ABNORMAL HIGH (ref 8–23)
CO2: 31 mmol/L (ref 22–32)
Calcium: 9.4 mg/dL (ref 8.9–10.3)
Chloride: 98 mmol/L (ref 98–111)
Creatinine, Ser: 2.74 mg/dL — ABNORMAL HIGH (ref 0.44–1.00)
GFR calc Af Amer: 18 mL/min — ABNORMAL LOW (ref >60)
GFR calc non Af Amer: 16 mL/min — ABNORMAL LOW (ref >60)
Glucose, Bld: 153 mg/dL — ABNORMAL HIGH (ref 70–99)
Potassium: 3.2 mmol/L — ABNORMAL LOW (ref 3.5–5.1)
Sodium: 143 mmol/L (ref 135–145)

## 2019-06-21 LAB — HEPARIN LEVEL (UNFRACTIONATED)
Heparin Unfractionated: 0.66 IU/mL (ref 0.30–0.70)
Heparin Unfractionated: 0.84 IU/mL — ABNORMAL HIGH (ref 0.30–0.70)

## 2019-06-21 LAB — BLOOD GAS, ARTERIAL
Acid-Base Excess: 10.3 mmol/L — ABNORMAL HIGH (ref 0.0–2.0)
Bicarbonate: 33 mmol/L — ABNORMAL HIGH (ref 20.0–28.0)
FIO2: 32
O2 Saturation: 93.7 %
Patient temperature: 37
pCO2 arterial: 51.2 mmHg — ABNORMAL HIGH (ref 32.0–48.0)
pH, Arterial: 7.447 (ref 7.350–7.450)
pO2, Arterial: 71 mmHg — ABNORMAL LOW (ref 83.0–108.0)

## 2019-06-21 LAB — PROTIME-INR
INR: 1.1 (ref 0.8–1.2)
Prothrombin Time: 13.9 seconds (ref 11.4–15.2)

## 2019-06-21 LAB — APTT: aPTT: 78 seconds — ABNORMAL HIGH (ref 24–36)

## 2019-06-21 MED ORDER — VANCOMYCIN HCL 500 MG IV SOLR
500.0000 mg | INTRAVENOUS | Status: DC
  Filled 2019-06-21: qty 500

## 2019-06-21 MED ORDER — PROMETHAZINE HCL 25 MG/ML IJ SOLN
12.5000 mg | Freq: Four times a day (QID) | INTRAMUSCULAR | Status: DC | PRN
  Administered 2019-06-21 – 2019-06-23 (×3): 12.5 mg via INTRAVENOUS
  Filled 2019-06-21 (×4): qty 1

## 2019-06-21 MED ORDER — VANCOMYCIN HCL IN DEXTROSE 1-5 GM/200ML-% IV SOLN
1000.0000 mg | INTRAVENOUS | Status: AC
  Administered 2019-06-22: 1000 mg via INTRAVENOUS
  Filled 2019-06-21: qty 200

## 2019-06-21 MED ORDER — CHLORHEXIDINE GLUCONATE CLOTH 2 % EX PADS
6.0000 | MEDICATED_PAD | Freq: Once | CUTANEOUS | Status: AC
  Administered 2019-06-22: 6 via TOPICAL

## 2019-06-21 MED ORDER — CHLORHEXIDINE GLUCONATE CLOTH 2 % EX PADS
6.0000 | MEDICATED_PAD | Freq: Once | CUTANEOUS | Status: AC
  Administered 2019-06-21: 6 via TOPICAL

## 2019-06-21 NOTE — Progress Notes (Signed)
ANTICOAGULATION CONSULT NOTE -   Pharmacy Consult for heparin Indication: potential PE  Allergies  Allergen Reactions  . Penicillins Rash    Did it involve swelling of the face/tongue/throat, SOB, or low BP? Unknown Did it involve sudden or severe rash/hives, skin peeling, or any reaction on the inside of your mouth or nose? Janifer Adie Did you need to seek medical attention at a hospital or doctor's office? No When did it last happen?childhood If all above answers are "NO", may proceed with cephalosporin use.   . Sulfa Antibiotics Rash    Patient Measurements: Height: 5\' 6"  (167.6 cm) Weight: 177 lb 4 oz (80.4 kg) IBW/kg (Calculated) : 59.3 Heparin Dosing Weight: 75 kg   Vital Signs: Temp: 98.2 F (36.8 C) (07/21 0532) Temp Source: Oral (07/21 0532) BP: 132/65 (07/21 0532) Pulse Rate: 100 (07/21 0532)  Labs: Recent Labs    06/20/19 1516 06/21/19 0034 06/21/19 0415  HGB 15.8*  --  13.6  HCT 47.8*  --  41.2  PLT 254  --  170  APTT  --  78*  --   LABPROT  --  13.9  --   INR  --  1.1  --   HEPARINUNFRC  --   --  0.66  CREATININE 2.90*  --  2.74*    Estimated Creatinine Clearance: 18.1 mL/min (A) (by C-G formula based on SCr of 2.74 mg/dL (H)).   Medical History: Past Medical History:  Diagnosis Date  . Hyperlipidemia   . Hypertension   . Thyroid disease     Medications:  Medications Prior to Admission  Medication Sig Dispense Refill Last Dose  . atorvastatin (LIPITOR) 10 MG tablet Take 10 mg by mouth every morning.    06/20/2019 at Unknown time  . Calcium Carb-Cholecalciferol 600-400 MG-UNIT TABS Take 1 tablet by mouth daily.    06/20/2019 at Unknown time  . lisinopril-hydrochlorothiazide (ZESTORETIC) 20-25 MG tablet Take 0.5 tablets by mouth daily. 45 tablet 3 06/20/2019 at Unknown time  . SYNTHROID 75 MCG tablet Take 75 mcg by mouth every morning.    06/20/2019 at Unknown time    Assessment: Pharmacy consulted to dose heparin in patient with potential  PE (VQ scan pending).  Patient not on anticoagulation prior to admission.  Goal of Therapy:  Heparin level 0.3-0.7 units/ml Monitor platelets by anticoagulation protocol: Yes   Plan:  Continue heparin infusion at 1200 units/hr Check anti-Xa level in 8 hours and daily while on heparin. Continue to monitor H&H and platelets.  Margot Ables, PharmD Clinical Pharmacist 06/21/2019 7:58 AM

## 2019-06-21 NOTE — Progress Notes (Signed)
ANTICOAGULATION CONSULT NOTE -   Pharmacy Consult for heparin Indication: potential PE  Allergies  Allergen Reactions  . Penicillins Rash    Did it involve swelling of the face/tongue/throat, SOB, or low BP? Unknown Did it involve sudden or severe rash/hives, skin peeling, or any reaction on the inside of your mouth or nose? Janifer Adie Did you need to seek medical attention at a hospital or doctor's office? No When did it last happen?childhood If all above answers are "NO", may proceed with cephalosporin use.   . Sulfa Antibiotics Rash    Patient Measurements: Height: 5\' 6"  (167.6 cm) Weight: 177 lb 4 oz (80.4 kg) IBW/kg (Calculated) : 59.3 Heparin Dosing Weight: 75 kg   Vital Signs: Temp: 98.2 F (36.8 C) (07/21 0532) Temp Source: Oral (07/21 0532) BP: 132/65 (07/21 0532) Pulse Rate: 100 (07/21 0532)  Labs: Recent Labs    06/20/19 1516 06/21/19 0034 06/21/19 0415 06/21/19 1400  HGB 15.8*  --  13.6  --   HCT 47.8*  --  41.2  --   PLT 254  --  170  --   APTT  --  78*  --   --   LABPROT  --  13.9  --   --   INR  --  1.1  --   --   HEPARINUNFRC  --   --  0.66 0.84*  CREATININE 2.90*  --  2.74*  --     Estimated Creatinine Clearance: 18.1 mL/min (A) (by C-G formula based on SCr of 2.74 mg/dL (H)).   Medical History: Past Medical History:  Diagnosis Date  . Hyperlipidemia   . Hypertension   . Thyroid disease     Medications:  Medications Prior to Admission  Medication Sig Dispense Refill Last Dose  . atorvastatin (LIPITOR) 10 MG tablet Take 10 mg by mouth every morning.    06/20/2019 at Unknown time  . Calcium Carb-Cholecalciferol 600-400 MG-UNIT TABS Take 1 tablet by mouth daily.    06/20/2019 at Unknown time  . lisinopril-hydrochlorothiazide (ZESTORETIC) 20-25 MG tablet Take 0.5 tablets by mouth daily. 45 tablet 3 06/20/2019 at Unknown time  . SYNTHROID 75 MCG tablet Take 75 mcg by mouth every morning.    06/20/2019 at Unknown time     Assessment: Pharmacy consulted to dose heparin in patient with potential PE (VQ scan pending).  Patient not on anticoagulation prior to admission.   HL 0.84, supratherapeutic   Goal of Therapy:  Heparin level 0.3-0.7 units/ml Monitor platelets by anticoagulation protocol: Yes   Plan:  Reduce heparin infusion to 1050 units/hr Heparin is being stopped at midnight per Dr Arnoldo Morale for surgery 7/22 . Continue to monitor H&H and platelets.  Thomasenia Sales, PharmD, MBA, BCGP Clinical Pharmacist  06/21/2019 2:44 PM

## 2019-06-21 NOTE — Progress Notes (Signed)
Heparin drip started as per order.

## 2019-06-21 NOTE — Progress Notes (Signed)
PROGRESS NOTE    Hannah [name]  WJX:[id number] DOB: 1941-02-02 DOA: 06/20/2019 PCP: Remus Loffler, PA-C   Brief Narrative:  Per HPI: Hannah [name] is a 78 y.o. female with medical history significant for hypertension, hyperlipidemia hypothyroidism who presented to the ED with complaints of abdominal pain and multiple episodes of vomiting over the past 5 days.  Patient last bowel movement was 5 days ago.  No fever no chills. She also reports a chronic intermittent dry cough that is unchanged, she denies difficulty breathing, chest pain.  She never smoked cigarettes.  Denies lower extremity swelling.  No history of blood clots in lungs or legs.  She is fairly active, lives alone, mows her own lawn. Daughter is an Nutritional therapist here at [employer]. Patient initially presented at the urgent care and was subsequently referred to the ED.  Patient was admitted with incarcerated umbilical hernia along with AKI likely secondary to poor oral intake.  She was noted to have some hypoxemia as well and upper respiratory symptoms with chest x-ray that was noted to be negative.  VQ scan was ordered and was negative for PE, but only perfusion scan was performed.  Patient has been started on heparin drip.  Assessment & Plan:   Active Problems:   AKI (acute kidney injury) (HCC)   Incarcerated umbilical hernia   Acute kidney injury- creatinine 2.9, baseline ~0.9 (per care everywhere).  With hypotension.  Likely prerenal from vomiting, poor p.o. intake and ongoing lisinopril- HCTZ use.   - BMP a.m -Hold home lisinopril HCTZ -Continue current IV fluid to also assist with hypokalemia  Hypoxia- possibly secondary to PE -VQ scan with only perfusion side performed demonstrating no PE currently -Wells score 4.5 indicating moderate to high risk for PE and will need CTA evaluation once AKI resolves -Continue IV heparin drip for now until full evaluation can be completed, of course this will be held at midnight for  upcoming surgery in a.m. but should be restarted afterwards -Chest x-ray negative suggesting no other etiology currently -We will check ABG to confirm hypoxemia and severity -2D echocardiogram for further evaluation that may suggest PE  Incarcerated umbilical hernia-manually reduced in the ED.   -Dr. Lovell Sheehan for surgery in a.m. -Hold heparin drip at midnight  Hypertension-stable -Continue IV fluid for now -Hold home lisinopril HCTZ -Restart home medications when indicated  Hypothyroidism -Hold home Synthroid for now while n.p.o.   DVT prophylaxis: Heparin drip Code Status: Full Family Communication: None at bedside Disposition Plan: Plans for surgery in a.m.   Consultants:   General surgery  Procedures:   None  Antimicrobials:   None   Subjective: Patient seen and evaluated today with no new acute complaints or concerns. No acute concerns or events noted overnight.  She remains on 3 L nasal cannula with no significant respiratory distress noted.  She has been feeling somewhat nauseous throughout the day.  Objective: Vitals:   06/20/19 2200 06/20/19 2246 06/20/19 2307 06/21/19 0532  BP: 122/78  (!) 120/51 132/65  Pulse: 96  92 100  Resp: (!) 23  20 (!) 24  Temp:   98.4 F (36.9 C) 98.2 F (36.8 C)  TempSrc:   Oral Oral  SpO2: 100%  94% 100%  Weight:  80.4 kg    Height:  5\' 6"  (1.676 m)      Intake/Output Summary (Last 24 hours) at 06/21/2019 1546 Last data filed at 06/21/2019 0554 Gross per 24 hour  Intake 1549.19 ml  Output -  Net 1549.19 ml   Filed Weights   06/20/19 1424 06/20/19 2246  Weight: 78.9 kg 80.4 kg    Examination:  General exam: Appears calm and comfortable  Respiratory system: Clear to auscultation. Respiratory effort normal.  Currently on 3 L nasal cannula Cardiovascular system: S1 & S2 heard, RRR. No JVD, murmurs, rubs, gallops or clicks. No pedal edema. Gastrointestinal system: Abdomen is nondistended, soft and nontender.   Hernia present near umbilicus that is easily reducible.  No organomegaly or masses felt. Normal bowel sounds heard. Central nervous system: Alert and oriented. No focal neurological deficits. Extremities: Symmetric 5 x 5 power. Skin: No rashes, lesions or ulcers Psychiatry: Judgement and insight appear normal. Mood & affect appropriate.     Data Reviewed: I have personally reviewed following labs and imaging studies  CBC: Recent Labs  Lab 06/20/19 1516 06/21/19 0415  WBC 11.1* 9.4  HGB 15.8* 13.6  HCT 47.8* 41.2  MCV 87.7 88.0  PLT 254 170   Basic Metabolic Panel: Recent Labs  Lab 06/20/19 1516 06/21/19 0415  NA 141 143  K 3.5 3.2*  CL 96* 98  CO2 30 31  GLUCOSE 168* 153*  BUN 47* 54*  CREATININE 2.90* 2.74*  CALCIUM 10.7* 9.4   GFR: Estimated Creatinine Clearance: 18.1 mL/min (A) (by C-G formula based on SCr of 2.74 mg/dL (H)). Liver Function Tests: Recent Labs  Lab 06/20/19 1516  AST 26  ALT 18  ALKPHOS 79  BILITOT 1.5*  PROT 8.7*  ALBUMIN 5.0   Recent Labs  Lab 06/20/19 1516  LIPASE 30   No results for input(s): AMMONIA in the last 168 hours. Coagulation Profile: Recent Labs  Lab 06/21/19 0034  INR 1.1   Cardiac Enzymes: No results for input(s): CKTOTAL, CKMB, CKMBINDEX, TROPONINI in the last 168 hours. BNP (last 3 results) No results for input(s): PROBNP in the last 8760 hours. HbA1C: No results for input(s): HGBA1C in the last 72 hours. CBG: No results for input(s): GLUCAP in the last 168 hours. Lipid Profile: No results for input(s): CHOL, HDL, LDLCALC, TRIG, CHOLHDL, LDLDIRECT in the last 72 hours. Thyroid Function Tests: No results for input(s): TSH, T4TOTAL, FREET4, T3FREE, THYROIDAB in the last 72 hours. Anemia Panel: No results for input(s): VITAMINB12, FOLATE, FERRITIN, TIBC, IRON, RETICCTPCT in the last 72 hours. Sepsis Labs: No results for input(s): PROCALCITON, LATICACIDVEN in the last 168 hours.  Recent Results (from the  past 240 hour(s))  SARS Coronavirus 2 (CEPHEID - Performed in [facility] hospital lab), Hosp Order     Status: None   Collection Time: 06/20/19  5:28 PM   Specimen: Nasopharyngeal Swab  Result Value Ref Range Status   SARS Coronavirus 2 NEGATIVE NEGATIVE Final    Comment: (NOTE) If result is NEGATIVE SARS-CoV-2 target nucleic acids are NOT DETECTED. The SARS-CoV-2 RNA is generally detectable in upper and lower  respiratory specimens during the acute phase of infection. The lowest  concentration of SARS-CoV-2 viral copies this assay can detect is 250  copies / mL. A negative result does not preclude SARS-CoV-2 infection  and should not be used as the sole basis for treatment or other  patient management decisions.  A negative result may occur with  improper specimen collection / handling, submission of specimen other  than nasopharyngeal swab, presence of viral mutation(s) within the  areas targeted by this assay, and inadequate number of viral copies  (<250 copies / mL). A negative result must be combined with clinical  observations, patient  history, and epidemiological information. If result is POSITIVE SARS-CoV-2 target nucleic acids are DETECTED. The SARS-CoV-2 RNA is generally detectable in upper and lower  respiratory specimens dur ing the acute phase of infection.  Positive  results are indicative of active infection with SARS-CoV-2.  Clinical  correlation with patient history and other diagnostic information is  necessary to determine patient infection status.  Positive results do  not rule out bacterial infection or co-infection with other viruses. If result is PRESUMPTIVE POSTIVE SARS-CoV-2 nucleic acids MAY BE PRESENT.   A presumptive positive result was obtained on the submitted specimen  and confirmed on repeat testing.  While 2019 novel coronavirus  (SARS-CoV-2) nucleic acids may be present in the submitted sample  additional confirmatory testing may be necessary for  epidemiological  and / or clinical management purposes  to differentiate between  SARS-CoV-2 and other Sarbecovirus currently known to infect humans.  If clinically indicated additional testing with an alternate test  methodology [phone]) is advised. The SARS-CoV-2 RNA is generally  detectable in upper and lower respiratory sp ecimens during the acute  phase of infection. The expected result is Negative. Fact Sheet for Patients:  BoilerBrush.com.cy Fact Sheet for Healthcare Providers: https://pope.com/ This test is not yet approved or cleared by the [place] FDA and has been authorized for detection and/or diagnosis of SARS-CoV-2 by FDA under an Emergency Use Authorization (EUA).  This EUA will remain in effect (meaning this test can be used) for the duration of the COVID-19 declaration under Section 564(b)(1) of the Act, 21 U.S.C. section 360bbb-3(b)(1), unless the authorization is terminated or revoked sooner. Performed at [facility], [address] [place], [place] [address]          Radiology Studies: Ct Abdomen Pelvis Wo Contrast  Result Date: 06/20/2019 CLINICAL DATA:  Abdominal pain for several days EXAM: CT ABDOMEN AND PELVIS WITHOUT CONTRAST TECHNIQUE: Multidetector CT imaging of the abdomen and pelvis was performed following the standard protocol without IV contrast. COMPARISON:  None. FINDINGS: Lower chest: No acute abnormality. Hepatobiliary: Liver is well visualized within normal limits. The gallbladder is well distended with 2 heavily calcified gallstones. No wall thickening or pericholecystic fluid is noted. Pancreas: Unremarkable. No pancreatic ductal dilatation or surrounding inflammatory changes. Spleen: Normal in size without focal abnormality. Adrenals/Urinary Tract: Adrenal glands are within normal limits. Kidneys are well visualized bilaterally. No renal calculi or obstructive changes are seen. The bladder is  well distended. Stomach/Bowel: Diverticular change of the colon is identified without inflammatory change. The colon is predominately decompressed however. The appendix has been surgically removed. There is an incarcerated loop of small bowel identified within an umbilical hernia with proximal small bowel dilatation extending from the stomach to the level of the hernia. The small bowel distal to the hernia is completely decompressed comprising almost the entire ileum. Vascular/Lymphatic: Aortic atherosclerosis. No enlarged abdominal or pelvic lymph nodes. Reproductive: Status post hysterectomy. No adnexal masses. Other: No abdominal wall hernia or abnormality. No abdominopelvic ascites. Musculoskeletal: No acute or significant osseous findings. IMPRESSION: Incarcerated umbilical hernia containing a loop of small bowel likely near the junction of the jejunum and ileum with proximal small bowel obstruction. Cholelithiasis without complicating factors. Diverticulosis without diverticulitis. Electronically Signed   By: Alcide Clever M.D.   On: 06/20/2019 18:17   Nm Pulmonary Perfusion  Result Date: 06/21/2019 CLINICAL DATA:  78 year old female with left-sided chest pain and nausea. Hypoxia. EXAM: NUCLEAR MEDICINE PERFUSION LUNG SCAN TECHNIQUE: Perfusion images were obtained in multiple projections  after intravenous injection of radiopharmaceutical. Ventilation scans intentionally deferred if perfusion scan and chest x-ray adequate for interpretation during COVID 19 epidemic. RADIOPHARMACEUTICALS:  1.6 mCi Tc-16m MAA IV COMPARISON:  Chest radiograph dated 06/20/2019 FINDINGS: Perfusion study demonstrates a homogeneous and uniform uptake within the lungs. No large or segmental perfusion defect identified. The ventilation scan was not performed. IMPRESSION: No perfusion defect to suggest pulmonary embolism. Electronically Signed   By: Elgie Collard M.D.   On: 06/21/2019 00:07   Dg Chest Port 1 View  Result Date:  06/20/2019 CLINICAL DATA:  Abdominal pain.  Low O2 saturation. EXAM: PORTABLE CHEST 1 VIEW COMPARISON:  None. FINDINGS: The heart size and mediastinal contours are within normal limits. Both lungs are clear. The visualized skeletal structures are unremarkable. IMPRESSION: Normal exam. Electronically Signed   By: Francene Boyers M.D.   On: 06/20/2019 17:48        Scheduled Meds: . fentaNYL (SUBLIMAZE) injection  50 mcg Intravenous Once  . sodium chloride flush  3 mL Intravenous Once   Continuous Infusions: . dextrose 5 % and 0.9 % NaCl with KCl 20 mEq/L 100 mL/hr at 06/21/19 1123  . heparin 1,050 Units/hr (06/21/19 1505)     LOS: 0 days    Time spent: 30 minutes    Cally Nygard Hoover Brunette, DO [facility] Pager [phone]  If 7PM-7AM, please contact night-coverage [email or web address] Password [facility] 06/21/2019, 3:46 PM

## 2019-06-21 NOTE — Consult Note (Signed)
Reason for Consult: Incarcerated umbilical hernia Referring Physician: Dr. Tera MaterShah  Hannah [name] is an 78 y.o. female.  HPI: Patient is a 78 year old white female who was seen in the emergency room yesterday with worsening abdominal pain and swelling over the past few days.  She started having emesis.  She was found on physical examination to have an incarcerated umbilical hernia.  This was reduced in the emergency room.  She was also noted to be somewhat short of breath, so she was started on a heparin drip for work-up of a pulmonary embolus.  Nuclear perfusion scan revealed no defects.  She was made to the hospital for further evaluation treatment.  This morning, her hernia recurred.  She is nauseated.  Her pain is 3 out of 10.  Past Medical History:  Diagnosis Date  . Hyperlipidemia   . Hypertension   . Thyroid disease     Past Surgical History:  Procedure Laterality Date  . ABDOMINAL HYSTERECTOMY    . APPENDECTOMY    . BLADDER SURGERY    . EYE SURGERY      Family History  Problem Relation Age of Onset  . Stroke Mother   . Heart disease Mother   . Heart disease Father   . Stroke Father   . Cancer Father   . Cancer Sister   . Aneurysm Brother     Social History:  reports that she has never smoked. She has never used smokeless tobacco. She reports that she does not drink alcohol or use drugs.  Allergies:  Allergies  Allergen Reactions  . Penicillins Rash    Did it involve swelling of the face/tongue/throat, SOB, or low BP? Unknown Did it involve sudden or severe rash/hives, skin peeling, or any reaction on the inside of your mouth or nose? Ruta HindsUnknown-rash Did you need to seek medical attention at a hospital or doctor's office? No When did it last happen?childhood If all above answers are "NO", may proceed with cephalosporin use.   . Sulfa Antibiotics Rash    Medications: I have reviewed the patient's current medications.  Results for orders placed or performed during  the hospital encounter of 06/20/19 (from the past 48 hour(s))  Lipase, blood     Status: None   Collection Time: 06/20/19  3:16 PM  Result Value Ref Range   Lipase 30 11 - 51 U/L    Comment: Performed at [facility], [address] [place], [place] [address]  Comprehensive metabolic panel     Status: Abnormal   Collection Time: 06/20/19  3:16 PM  Result Value Ref Range   Sodium 141 135 - 145 mmol/L   Potassium 3.5 3.5 - 5.1 mmol/L   Chloride 96 (L) 98 - 111 mmol/L   CO2 30 22 - 32 mmol/L   Glucose, Bld 168 (H) 70 - 99 mg/dL   BUN 47 (H) 8 - 23 mg/dL   Creatinine, Ser 8.112.90 (H) 0.44 - 1.00 mg/dL   Calcium 91.410.7 (H) 8.9 - 10.3 mg/dL   Total Protein 8.7 (H) 6.5 - 8.1 g/dL   Albumin 5.0 3.5 - 5.0 g/dL   AST 26 15 - 41 U/L   ALT 18 0 - 44 U/L   Alkaline Phosphatase 79 38 - 126 U/L   Total Bilirubin 1.5 (H) 0.3 - 1.2 mg/dL   GFR calc non Af Amer 15 (L) >60 mL/min   GFR calc Af Amer 17 (L) >60 mL/min   Anion gap 15 5 - 15    Comment:  Performed at [facility], [address] [place], [place] [address]  CBC     Status: Abnormal   Collection Time: 06/20/19  3:16 PM  Result Value Ref Range   WBC 11.1 (H) 4.0 - 10.5 K/uL   RBC 5.45 (H) 3.87 - 5.11 MIL/uL   Hemoglobin 15.8 (H) 12.0 - 15.0 g/dL   HCT 78.447.8 (H) 69.636.0 - 29.546.0 %   MCV 87.7 80.0 - 100.0 fL   MCH 29.0 26.0 - 34.0 pg   MCHC 33.1 30.0 - 36.0 g/dL   RDW 28.414.5 13.211.5 - 44.015.5 %   Platelets 254 150 - 400 K/uL   nRBC 0.0 0.0 - 0.2 %    Comment: Performed at [facility], [address] [place], [place] [address]  SARS Coronavirus 2 (CEPHEID - Performed in [facility] hospital lab), Hosp Order     Status: None   Collection Time: 06/20/19  5:28 PM   Specimen: Nasopharyngeal Swab  Result Value Ref Range   SARS Coronavirus 2 NEGATIVE NEGATIVE    Comment: (NOTE) If result is NEGATIVE SARS-CoV-2 target nucleic acids are NOT DETECTED. The SARS-CoV-2 RNA is generally detectable in upper and lower  respiratory specimens during the acute  phase of infection. The lowest  concentration of SARS-CoV-2 viral copies this assay can detect is 250  copies / mL. A negative result does not preclude SARS-CoV-2 infection  and should not be used as the sole basis for treatment or other  patient management decisions.  A negative result may occur with  improper specimen collection / handling, submission of specimen other  than nasopharyngeal swab, presence of viral mutation(s) within the  areas targeted by this assay, and inadequate number of viral copies  (<250 copies / mL). A negative result must be combined with clinical  observations, patient history, and epidemiological information. If result is POSITIVE SARS-CoV-2 target nucleic acids are DETECTED. The SARS-CoV-2 RNA is generally detectable in upper and lower  respiratory specimens dur ing the acute phase of infection.  Positive  results are indicative of active infection with SARS-CoV-2.  Clinical  correlation with patient history and other diagnostic information is  necessary to determine patient infection status.  Positive results do  not rule out bacterial infection or co-infection with other viruses. If result is PRESUMPTIVE POSTIVE SARS-CoV-2 nucleic acids MAY BE PRESENT.   A presumptive positive result was obtained on the submitted specimen  and confirmed on repeat testing.  While 2019 novel coronavirus  (SARS-CoV-2) nucleic acids may be present in the submitted sample  additional confirmatory testing may be necessary for epidemiological  and / or clinical management purposes  to differentiate between  SARS-CoV-2 and other Sarbecovirus currently known to infect humans.  If clinically indicated additional testing with an alternate test  methodology [phone]([id number]) is advised. The SARS-CoV-2 RNA is generally  detectable in upper and lower respiratory sp ecimens during the acute  phase of infection. The expected result is Negative. Fact Sheet for Patients:   BoilerBrush.com.cyhttps://www.fda.gov/media/136312/download Fact Sheet for Healthcare Providers: https://pope.com/https://www.fda.gov/media/136313/download This test is not yet approved or cleared by the [place] FDA and has been authorized for detection and/or diagnosis of SARS-CoV-2 by FDA under an Emergency Use Authorization (EUA).  This EUA will remain in effect (meaning this test can be used) for the duration of the COVID-19 declaration under Section 564(b)(1) of the Act, 21 U.S.C. section 360bbb-3(b)(1), unless the authorization is terminated or revoked sooner. Performed at [facility], [address] [place], [place] [address]  Protime-INR     Status: None   Collection Time: 06/21/19 12:34 AM  Result Value Ref Range   Prothrombin Time 13.9 11.4 - 15.2 seconds   INR 1.1 0.8 - 1.2    Comment: (NOTE) INR goal varies based on device and disease states. Performed at [facility], [address] [place], [place] [address]   APTT     Status: Abnormal   Collection Time: 06/21/19 12:34 AM  Result Value Ref Range   aPTT 78 (H) 24 - 36 seconds    Comment:        IF BASELINE aPTT IS ELEVATED, SUGGEST PATIENT RISK ASSESSMENT BE USED TO DETERMINE APPROPRIATE ANTICOAGULANT THERAPY. Performed at [facility], [address] [place], [place] [address]   Heparin level (unfractionated)     Status: None   Collection Time: 06/21/19  4:15 AM  Result Value Ref Range   Heparin Unfractionated 0.66 0.30 - 0.70 IU/mL    Comment: (NOTE) If heparin results are below expected values, and patient dosage has  been confirmed, suggest follow up testing of antithrombin III levels. Performed at [facility], [address] [place], [place] [address]   CBC     Status: None   Collection Time: 06/21/19  4:15 AM  Result Value Ref Range   WBC 9.4 4.0 - 10.5 K/uL   RBC 4.68 3.87 - 5.11 MIL/uL   Hemoglobin 13.6 12.0 - 15.0 g/dL   HCT 91.441.2 78.236.0 - 95.646.0 %   MCV 88.0 80.0 - 100.0 fL   MCH 29.1 26.0 - 34.0 pg   MCHC 33.0 30.0 - 36.0  g/dL   RDW 21.314.5 08.611.5 - 57.815.5 %   Platelets 170 150 - 400 K/uL   nRBC 0.0 0.0 - 0.2 %    Comment: Performed at [facility], [address] [place], [place] [address]  Basic metabolic panel     Status: Abnormal   Collection Time: 06/21/19  4:15 AM  Result Value Ref Range   Sodium 143 135 - 145 mmol/L   Potassium 3.2 (L) 3.5 - 5.1 mmol/L   Chloride 98 98 - 111 mmol/L   CO2 31 22 - 32 mmol/L   Glucose, Bld 153 (H) 70 - 99 mg/dL   BUN 54 (H) 8 - 23 mg/dL   Creatinine, Ser 9.522.74 (H) 0.44 - 1.00 mg/dL   Calcium 9.4 8.9 - 84.110.3 mg/dL   GFR calc non Af Amer 16 (L) >60 mL/min   GFR calc Af Amer 18 (L) >60 mL/min   Anion gap 14 5 - 15    Comment: Performed at [facility], [address] [place], [place] [address]    Ct Abdomen Pelvis Wo Contrast  Result Date: 06/20/2019 CLINICAL DATA:  Abdominal pain for several days EXAM: CT ABDOMEN AND PELVIS WITHOUT CONTRAST TECHNIQUE: Multidetector CT imaging of the abdomen and pelvis was performed following the standard protocol without IV contrast. COMPARISON:  None. FINDINGS: Lower chest: No acute abnormality. Hepatobiliary: Liver is well visualized within normal limits. The gallbladder is well distended with 2 heavily calcified gallstones. No wall thickening or pericholecystic fluid is noted. Pancreas: Unremarkable. No pancreatic ductal dilatation or surrounding inflammatory changes. Spleen: Normal in size without focal abnormality. Adrenals/Urinary Tract: Adrenal glands are within normal limits. Kidneys are well visualized bilaterally. No renal calculi or obstructive changes are seen. The bladder is well distended. Stomach/Bowel: Diverticular change of the colon is identified without inflammatory change. The colon is predominately decompressed however. The appendix has been surgically removed.  There is an incarcerated loop of small bowel identified within an umbilical hernia with proximal small bowel dilatation extending from the stomach to the level of the  hernia. The small bowel distal to the hernia is completely decompressed comprising almost the entire ileum. Vascular/Lymphatic: Aortic atherosclerosis. No enlarged abdominal or pelvic lymph nodes. Reproductive: Status post hysterectomy. No adnexal masses. Other: No abdominal wall hernia or abnormality. No abdominopelvic ascites. Musculoskeletal: No acute or significant osseous findings. IMPRESSION: Incarcerated umbilical hernia containing a loop of small bowel likely near the junction of the jejunum and ileum with proximal small bowel obstruction. Cholelithiasis without complicating factors. Diverticulosis without diverticulitis. Electronically Signed   By: Inez Catalina M.D.   On: 06/20/2019 18:17   Nm Pulmonary Perfusion  Result Date: 06/21/2019 CLINICAL DATA:  78 year old female with left-sided chest pain and nausea. Hypoxia. EXAM: NUCLEAR MEDICINE PERFUSION LUNG SCAN TECHNIQUE: Perfusion images were obtained in multiple projections after intravenous injection of radiopharmaceutical. Ventilation scans intentionally deferred if perfusion scan and chest x-ray adequate for interpretation during COVID 19 epidemic. RADIOPHARMACEUTICALS:  1.6 mCi Tc-8m MAA IV COMPARISON:  Chest radiograph dated 06/20/2019 FINDINGS: Perfusion study demonstrates a homogeneous and uniform uptake within the lungs. No large or segmental perfusion defect identified. The ventilation scan was not performed. IMPRESSION: No perfusion defect to suggest pulmonary embolism. Electronically Signed   By: Anner Crete M.D.   On: 06/21/2019 00:07   Dg Chest Port 1 View  Result Date: 06/20/2019 CLINICAL DATA:  Abdominal pain.  Low O2 saturation. EXAM: PORTABLE CHEST 1 VIEW COMPARISON:  None. FINDINGS: The heart size and mediastinal contours are within normal limits. Both lungs are clear. The visualized skeletal structures are unremarkable. IMPRESSION: Normal exam. Electronically Signed   By: Lorriane Shire M.D.   On: 06/20/2019 17:48     ROS:  Pertinent items are noted in HPI.  Blood pressure 132/65, pulse 100, temperature 98.2 F (36.8 C), temperature source Oral, resp. rate (!) 24, height 5\' 6"  (1.676 m), weight 80.4 kg, SpO2 100 %. Physical Exam: Pleasant white female in who appears fatigued and nauseated. Head is normocephalic, atraumatic Lungs are clear to auscultation with good breath sounds bilaterally Heart examination reveals a regular rate and rhythm without S3, S4, murmurs Abdomen is soft with thin skin over an umbilical hernia.  This was easily reduced at bedside.  CT scan images personally reviewed  Assessment/Plan: Impression: Incarcerated umbilical hernia, again reduced.  Patient is being worked up for her shortness of breath and is on a heparin drip at the present time.  She also has acute kidney injury which is being addressed. Plan: As her hernia keeps recurring and causing symptoms, will proceed with umbilical herniorrhaphy with mesh tomorrow.  The risks and benefits of the procedure including bleeding, infection, mesh use, and the possible recurrence of the hernia were fully explained to the patient, who gave informed consent.  As this is not a major abdominal surgery, I doubt this will affect her kidney injury significantly.  Will stop heparin at midnight.  Aviva Signs 06/21/2019, 8:40 AM

## 2019-06-21 NOTE — H&P (View-Only) (Signed)
Reason for Consult: Incarcerated umbilical hernia Referring Physician: Dr. Tera MaterShah  Tenee Rayfield Hannah [name] is an 78 y.o. female.  HPI: Patient is a 78 year old white female who was seen in the emergency room yesterday with worsening abdominal pain and swelling over the past few days.  She started having emesis.  She was found on physical examination to have an incarcerated umbilical hernia.  This was reduced in the emergency room.  She was also noted to be somewhat short of breath, so she was started on a heparin drip for work-up of a pulmonary embolus.  Nuclear perfusion scan revealed no defects.  She was made to the hospital for further evaluation treatment.  This morning, her hernia recurred.  She is nauseated.  Her pain is 3 out of 10.  Past Medical History:  Diagnosis Date  . Hyperlipidemia   . Hypertension   . Thyroid disease     Past Surgical History:  Procedure Laterality Date  . ABDOMINAL HYSTERECTOMY    . APPENDECTOMY    . BLADDER SURGERY    . EYE SURGERY      Family History  Problem Relation Age of Onset  . Stroke Mother   . Heart disease Mother   . Heart disease Father   . Stroke Father   . Cancer Father   . Cancer Sister   . Aneurysm Brother     Social History:  reports that she has never smoked. She has never used smokeless tobacco. She reports that she does not drink alcohol or use drugs.  Allergies:  Allergies  Allergen Reactions  . Penicillins Rash    Did it involve swelling of the face/tongue/throat, SOB, or low BP? Unknown Did it involve sudden or severe rash/hives, skin peeling, or any reaction on the inside of your mouth or nose? Ruta HindsUnknown-rash Did you need to seek medical attention at a hospital or doctor's office? No When did it last happen?childhood If all above answers are "NO", may proceed with cephalosporin use.   . Sulfa Antibiotics Rash    Medications: I have reviewed the patient's current medications.  Results for orders placed or performed during  the hospital encounter of 06/20/19 (from the past 48 hour(s))  Lipase, blood     Status: None   Collection Time: 06/20/19  3:16 PM  Result Value Ref Range   Lipase 30 11 - 51 U/L    Comment: Performed at [facility], [address] [place], [place] [address]  Comprehensive metabolic panel     Status: Abnormal   Collection Time: 06/20/19  3:16 PM  Result Value Ref Range   Sodium 141 135 - 145 mmol/L   Potassium 3.5 3.5 - 5.1 mmol/L   Chloride 96 (L) 98 - 111 mmol/L   CO2 30 22 - 32 mmol/L   Glucose, Bld 168 (H) 70 - 99 mg/dL   BUN 47 (H) 8 - 23 mg/dL   Creatinine, Ser 8.112.90 (H) 0.44 - 1.00 mg/dL   Calcium 91.410.7 (H) 8.9 - 10.3 mg/dL   Total Protein 8.7 (H) 6.5 - 8.1 g/dL   Albumin 5.0 3.5 - 5.0 g/dL   AST 26 15 - 41 U/L   ALT 18 0 - 44 U/L   Alkaline Phosphatase 79 38 - 126 U/L   Total Bilirubin 1.5 (H) 0.3 - 1.2 mg/dL   GFR calc non Af Amer 15 (L) >60 mL/min   GFR calc Af Amer 17 (L) >60 mL/min   Anion gap 15 5 - 15    Comment:  Performed at [facility], [address] [place], [place] [address]  CBC     Status: Abnormal   Collection Time: 06/20/19  3:16 PM  Result Value Ref Range   WBC 11.1 (H) 4.0 - 10.5 K/uL   RBC 5.45 (H) 3.87 - 5.11 MIL/uL   Hemoglobin 15.8 (H) 12.0 - 15.0 g/dL   HCT 47.8 (H) 36.0 - 46.0 %   MCV 87.7 80.0 - 100.0 fL   MCH 29.0 26.0 - 34.0 pg   MCHC 33.1 30.0 - 36.0 g/dL   RDW 14.5 11.5 - 15.5 %   Platelets 254 150 - 400 K/uL   nRBC 0.0 0.0 - 0.2 %    Comment: Performed at [facility], [address] [place], [place] [address]  SARS Coronavirus 2 (CEPHEID - Performed in [place] hospital lab), Hosp Order     Status: None   Collection Time: 06/20/19  5:28 PM   Specimen: Nasopharyngeal Swab  Result Value Ref Range   SARS Coronavirus 2 NEGATIVE NEGATIVE    Comment: (NOTE) If result is NEGATIVE SARS-CoV-2 target nucleic acids are NOT DETECTED. The SARS-CoV-2 RNA is generally detectable in upper and lower  respiratory specimens during the acute  phase of infection. The lowest  concentration of SARS-CoV-2 viral copies this assay can detect is 250  copies / mL. A negative result does not preclude SARS-CoV-2 infection  and should not be used as the sole basis for treatment or other  patient management decisions.  A negative result may occur with  improper specimen collection / handling, submission of specimen other  than nasopharyngeal swab, presence of viral mutation(s) within the  areas targeted by this assay, and inadequate number of viral copies  (<250 copies / mL). A negative result must be combined with clinical  observations, patient history, and epidemiological information. If result is POSITIVE SARS-CoV-2 target nucleic acids are DETECTED. The SARS-CoV-2 RNA is generally detectable in upper and lower  respiratory specimens dur ing the acute phase of infection.  Positive  results are indicative of active infection with SARS-CoV-2.  Clinical  correlation with patient history and other diagnostic information is  necessary to determine patient infection status.  Positive results do  not rule out bacterial infection or co-infection with other viruses. If result is PRESUMPTIVE POSTIVE SARS-CoV-2 nucleic acids MAY BE PRESENT.   A presumptive positive result was obtained on the submitted specimen  and confirmed on repeat testing.  While 2019 novel coronavirus  (SARS-CoV-2) nucleic acids may be present in the submitted sample  additional confirmatory testing may be necessary for epidemiological  and / or clinical management purposes  to differentiate between  SARS-CoV-2 and other Sarbecovirus currently known to infect humans.  If clinically indicated additional testing with an alternate test  methodology ([id number]) is advised. The SARS-CoV-2 RNA is generally  detectable in upper and lower respiratory sp ecimens during the acute  phase of infection. The expected result is Negative. Fact Sheet for Patients:   https://www.fda.gov/media/136312/download Fact Sheet for Healthcare Providers: https://www.fda.gov/media/136313/download This test is not yet approved or cleared by the [place] FDA and has been authorized for detection and/or diagnosis of SARS-CoV-2 by FDA under an Emergency Use Authorization (EUA).  This EUA will remain in effect (meaning this test can be used) for the duration of the COVID-19 declaration under Section 564(b)(1) of the Act, 21 U.S.C. section 360bbb-3(b)(1), unless the authorization is terminated or revoked sooner. Performed at [facility], [address] [place], [place] [address]     Protime-INR     Status: None   Collection Time: 06/21/19 12:34 AM  Result Value Ref Range   Prothrombin Time 13.9 11.4 - 15.2 seconds   INR 1.1 0.8 - 1.2    Comment: (NOTE) INR goal varies based on device and disease states. Performed at [facility], [address] [place], [place] [address]   APTT     Status: Abnormal   Collection Time: 06/21/19 12:34 AM  Result Value Ref Range   aPTT 78 (H) 24 - 36 seconds    Comment:        IF BASELINE aPTT IS ELEVATED, SUGGEST PATIENT RISK ASSESSMENT BE USED TO DETERMINE APPROPRIATE ANTICOAGULANT THERAPY. Performed at [facility], [address] [place], [place] [address]   Heparin level (unfractionated)     Status: None   Collection Time: 06/21/19  4:15 AM  Result Value Ref Range   Heparin Unfractionated 0.66 0.30 - 0.70 IU/mL    Comment: (NOTE) If heparin results are below expected values, and patient dosage has  been confirmed, suggest follow up testing of antithrombin III levels. Performed at [facility], [address] [place], [place] [address]   CBC     Status: None   Collection Time: 06/21/19  4:15 AM  Result Value Ref Range   WBC 9.4 4.0 - 10.5 K/uL   RBC 4.68 3.87 - 5.11 MIL/uL   Hemoglobin 13.6 12.0 - 15.0 g/dL   HCT 91.441.2 78.236.0 - 95.646.0 %   MCV 88.0 80.0 - 100.0 fL   MCH 29.1 26.0 - 34.0 pg   MCHC 33.0 30.0 - 36.0  g/dL   RDW 21.314.5 08.611.5 - 57.815.5 %   Platelets 170 150 - 400 K/uL   nRBC 0.0 0.0 - 0.2 %    Comment: Performed at [facility], [address] [place], [place] [address]  Basic metabolic panel     Status: Abnormal   Collection Time: 06/21/19  4:15 AM  Result Value Ref Range   Sodium 143 135 - 145 mmol/L   Potassium 3.2 (L) 3.5 - 5.1 mmol/L   Chloride 98 98 - 111 mmol/L   CO2 31 22 - 32 mmol/L   Glucose, Bld 153 (H) 70 - 99 mg/dL   BUN 54 (H) 8 - 23 mg/dL   Creatinine, Ser 9.522.74 (H) 0.44 - 1.00 mg/dL   Calcium 9.4 8.9 - 84.110.3 mg/dL   GFR calc non Af Amer 16 (L) >60 mL/min   GFR calc Af Amer 18 (L) >60 mL/min   Anion gap 14 5 - 15    Comment: Performed at [facility], [address] [place], [place] [address]    Ct Abdomen Pelvis Wo Contrast  Result Date: 06/20/2019 CLINICAL DATA:  Abdominal pain for several days EXAM: CT ABDOMEN AND PELVIS WITHOUT CONTRAST TECHNIQUE: Multidetector CT imaging of the abdomen and pelvis was performed following the standard protocol without IV contrast. COMPARISON:  None. FINDINGS: Lower chest: No acute abnormality. Hepatobiliary: Liver is well visualized within normal limits. The gallbladder is well distended with 2 heavily calcified gallstones. No wall thickening or pericholecystic fluid is noted. Pancreas: Unremarkable. No pancreatic ductal dilatation or surrounding inflammatory changes. Spleen: Normal in size without focal abnormality. Adrenals/Urinary Tract: Adrenal glands are within normal limits. Kidneys are well visualized bilaterally. No renal calculi or obstructive changes are seen. The bladder is well distended. Stomach/Bowel: Diverticular change of the colon is identified without inflammatory change. The colon is predominately decompressed however. The appendix has been surgically removed.  There is an incarcerated loop of small bowel identified within an umbilical hernia with proximal small bowel dilatation extending from the stomach to the level of the  hernia. The small bowel distal to the hernia is completely decompressed comprising almost the entire ileum. Vascular/Lymphatic: Aortic atherosclerosis. No enlarged abdominal or pelvic lymph nodes. Reproductive: Status post hysterectomy. No adnexal masses. Other: No abdominal wall hernia or abnormality. No abdominopelvic ascites. Musculoskeletal: No acute or significant osseous findings. IMPRESSION: Incarcerated umbilical hernia containing a loop of small bowel likely near the junction of the jejunum and ileum with proximal small bowel obstruction. Cholelithiasis without complicating factors. Diverticulosis without diverticulitis. Electronically Signed   By: Inez Catalina M.D.   On: 06/20/2019 18:17   Nm Pulmonary Perfusion  Result Date: 06/21/2019 CLINICAL DATA:  78 year old female with left-sided chest pain and nausea. Hypoxia. EXAM: NUCLEAR MEDICINE PERFUSION LUNG SCAN TECHNIQUE: Perfusion images were obtained in multiple projections after intravenous injection of radiopharmaceutical. Ventilation scans intentionally deferred if perfusion scan and chest x-ray adequate for interpretation during COVID 19 epidemic. RADIOPHARMACEUTICALS:  1.6 mCi Tc-8m MAA IV COMPARISON:  Chest radiograph dated 06/20/2019 FINDINGS: Perfusion study demonstrates a homogeneous and uniform uptake within the lungs. No large or segmental perfusion defect identified. The ventilation scan was not performed. IMPRESSION: No perfusion defect to suggest pulmonary embolism. Electronically Signed   By: Anner Crete M.D.   On: 06/21/2019 00:07   Dg Chest Port 1 View  Result Date: 06/20/2019 CLINICAL DATA:  Abdominal pain.  Low O2 saturation. EXAM: PORTABLE CHEST 1 VIEW COMPARISON:  None. FINDINGS: The heart size and mediastinal contours are within normal limits. Both lungs are clear. The visualized skeletal structures are unremarkable. IMPRESSION: Normal exam. Electronically Signed   By: Lorriane Shire M.D.   On: 06/20/2019 17:48     ROS:  Pertinent items are noted in HPI.  Blood pressure 132/65, pulse 100, temperature 98.2 F (36.8 C), temperature source Oral, resp. rate (!) 24, height 5\' 6"  (1.676 m), weight 80.4 kg, SpO2 100 %. Physical Exam: Pleasant white female in who appears fatigued and nauseated. Head is normocephalic, atraumatic Lungs are clear to auscultation with good breath sounds bilaterally Heart examination reveals a regular rate and rhythm without S3, S4, murmurs Abdomen is soft with thin skin over an umbilical hernia.  This was easily reduced at bedside.  CT scan images personally reviewed  Assessment/Plan: Impression: Incarcerated umbilical hernia, again reduced.  Patient is being worked up for her shortness of breath and is on a heparin drip at the present time.  She also has acute kidney injury which is being addressed. Plan: As her hernia keeps recurring and causing symptoms, will proceed with umbilical herniorrhaphy with mesh tomorrow.  The risks and benefits of the procedure including bleeding, infection, mesh use, and the possible recurrence of the hernia were fully explained to the patient, who gave informed consent.  As this is not a major abdominal surgery, I doubt this will affect her kidney injury significantly.  Will stop heparin at midnight.  Aviva Signs 06/21/2019, 8:40 AM

## 2019-06-22 ENCOUNTER — Inpatient Hospital Stay (HOSPITAL_COMMUNITY): Payer: Medicare Other

## 2019-06-22 ENCOUNTER — Inpatient Hospital Stay (HOSPITAL_COMMUNITY): Payer: Medicare Other | Admitting: Anesthesiology

## 2019-06-22 ENCOUNTER — Encounter (HOSPITAL_COMMUNITY): Payer: Self-pay | Admitting: Emergency Medicine

## 2019-06-22 ENCOUNTER — Encounter (HOSPITAL_COMMUNITY)
Admission: EM | Disposition: A | Discharge status: Home / Self Care | Payer: Self-pay | Source: Home / Self Care | Attending: Internal Medicine

## 2019-06-22 ENCOUNTER — Other Ambulatory Visit: Payer: Self-pay

## 2019-06-22 DIAGNOSIS — R7989 Other specified abnormal findings of blood chemistry: Secondary | ICD-10-CM

## 2019-06-22 DIAGNOSIS — I10 Essential (primary) hypertension: Secondary | ICD-10-CM

## 2019-06-22 DIAGNOSIS — I2699 Other pulmonary embolism without acute cor pulmonale: Secondary | ICD-10-CM

## 2019-06-22 DIAGNOSIS — J9621 Acute and chronic respiratory failure with hypoxia: Secondary | ICD-10-CM

## 2019-06-22 DIAGNOSIS — E039 Hypothyroidism, unspecified: Secondary | ICD-10-CM

## 2019-06-22 HISTORY — PX: UMBILICAL HERNIA REPAIR: SHX196

## 2019-06-22 LAB — SURGICAL PCR SCREEN
MRSA, PCR: NEGATIVE
Staphylococcus aureus: POSITIVE — AB

## 2019-06-22 LAB — CBC
HCT: 41.5 % (ref 36.0–46.0)
Hemoglobin: 13.2 g/dL (ref 12.0–15.0)
MCH: 28.9 pg (ref 26.0–34.0)
MCHC: 31.8 g/dL (ref 30.0–36.0)
MCV: 91 fL (ref 80.0–100.0)
Platelets: 125 10*3/uL — ABNORMAL LOW (ref 150–400)
RBC: 4.56 MIL/uL (ref 3.87–5.11)
RDW: 14 % (ref 11.5–15.5)
WBC: 5.9 10*3/uL (ref 4.0–10.5)
nRBC: 0 % (ref 0.0–0.2)

## 2019-06-22 LAB — BASIC METABOLIC PANEL
Anion gap: 10 (ref 5–15)
BUN: 45 mg/dL — ABNORMAL HIGH (ref 8–23)
CO2: 31 mmol/L (ref 22–32)
Calcium: 9 mg/dL (ref 8.9–10.3)
Chloride: 106 mmol/L (ref 98–111)
Creatinine, Ser: 1.25 mg/dL — ABNORMAL HIGH (ref 0.44–1.00)
GFR calc Af Amer: 48 mL/min — ABNORMAL LOW (ref >60)
GFR calc non Af Amer: 41 mL/min — ABNORMAL LOW (ref >60)
Glucose, Bld: 159 mg/dL — ABNORMAL HIGH (ref 70–99)
Potassium: 3.7 mmol/L (ref 3.5–5.1)
Sodium: 147 mmol/L — ABNORMAL HIGH (ref 135–145)

## 2019-06-22 LAB — ECHOCARDIOGRAM COMPLETE
Height: 66 in
Weight: 2836 oz

## 2019-06-22 LAB — MAGNESIUM: Magnesium: 2 mg/dL (ref 1.7–2.4)

## 2019-06-22 SURGERY — REPAIR, HERNIA, UMBILICAL, ADULT
Anesthesia: General | Site: Abdomen

## 2019-06-22 MED ORDER — PROPOFOL 10 MG/ML IV BOLUS
INTRAVENOUS | Status: AC
  Filled 2019-06-22: qty 20

## 2019-06-22 MED ORDER — HYDROCODONE-ACETAMINOPHEN 7.5-325 MG PO TABS: 1.0000 | ORAL_TABLET | Freq: Once | ORAL | Status: DC | PRN

## 2019-06-22 MED ORDER — KETOROLAC TROMETHAMINE 30 MG/ML IJ SOLN
15.0000 mg | Freq: Once | INTRAMUSCULAR | Status: AC
  Administered 2019-06-22: 15 mg via INTRAVENOUS
  Filled 2019-06-22: qty 1

## 2019-06-22 MED ORDER — MIDAZOLAM HCL 2 MG/2ML IJ SOLN: 0.5000 mg | Freq: Once | INTRAMUSCULAR | Status: DC | PRN

## 2019-06-22 MED ORDER — LEVOTHYROXINE SODIUM 75 MCG PO TABS
75.0000 ug | ORAL_TABLET | Freq: Every day | ORAL | Status: DC
  Administered 2019-06-23 – 2019-06-25 (×2): 75 ug via ORAL
  Filled 2019-06-22 (×3): qty 1

## 2019-06-22 MED ORDER — SIMETHICONE 80 MG PO CHEW: 40.0000 mg | CHEWABLE_TABLET | Freq: Four times a day (QID) | ORAL | Status: DC | PRN

## 2019-06-22 MED ORDER — TRAMADOL HCL 50 MG PO TABS: 50.0000 mg | ORAL_TABLET | Freq: Four times a day (QID) | ORAL | Status: DC | PRN

## 2019-06-22 MED ORDER — ONDANSETRON HCL 4 MG/2ML IJ SOLN
4.0000 mg | Freq: Once | INTRAMUSCULAR | Status: AC
  Administered 2019-06-22: 4 mg via INTRAVENOUS

## 2019-06-22 MED ORDER — DEXTROSE 5 % IV SOLN
INTRAVENOUS | Status: DC | PRN
  Administered 2019-06-22: 11:00:00 via INTRAVENOUS

## 2019-06-22 MED ORDER — HYDROMORPHONE HCL 1 MG/ML IJ SOLN: 0.2500 mg | INTRAMUSCULAR | Status: DC | PRN

## 2019-06-22 MED ORDER — DEXAMETHASONE SODIUM PHOSPHATE 4 MG/ML IJ SOLN
INTRAMUSCULAR | Status: DC | PRN
  Administered 2019-06-22: 8 mg via INTRAVENOUS

## 2019-06-22 MED ORDER — BUPIVACAINE LIPOSOME 1.3 % IJ SUSP
INTRAMUSCULAR | Status: AC
  Filled 2019-06-22: qty 20

## 2019-06-22 MED ORDER — FENTANYL CITRATE (PF) 100 MCG/2ML IJ SOLN
INTRAMUSCULAR | Status: AC
  Filled 2019-06-22: qty 4

## 2019-06-22 MED ORDER — LACTATED RINGERS IV SOLN
INTRAVENOUS | Status: DC
  Administered 2019-06-22: 11:00:00 via INTRAVENOUS

## 2019-06-22 MED ORDER — SUCCINYLCHOLINE CHLORIDE 20 MG/ML IJ SOLN
INTRAMUSCULAR | Status: DC | PRN
  Administered 2019-06-22: 120 mg via INTRAVENOUS

## 2019-06-22 MED ORDER — CHLORHEXIDINE GLUCONATE CLOTH 2 % EX PADS
6.0000 | MEDICATED_PAD | Freq: Every day | CUTANEOUS | Status: DC
  Administered 2019-06-22 – 2019-06-23 (×2): 6 via TOPICAL

## 2019-06-22 MED ORDER — SODIUM CHLORIDE 0.9 % IR SOLN
Status: DC | PRN
  Administered 2019-06-22: 1000 mL

## 2019-06-22 MED ORDER — SUGAMMADEX SODIUM 200 MG/2ML IV SOLN
INTRAVENOUS | Status: DC | PRN
  Administered 2019-06-22: 160.8 mg via INTRAVENOUS

## 2019-06-22 MED ORDER — ROCURONIUM BROMIDE 50 MG/5ML IV SOSY
PREFILLED_SYRINGE | INTRAVENOUS | Status: DC | PRN
  Administered 2019-06-22: 20 mg via INTRAVENOUS

## 2019-06-22 MED ORDER — PROPOFOL 10 MG/ML IV BOLUS
INTRAVENOUS | Status: DC | PRN
  Administered 2019-06-22: 120 mg via INTRAVENOUS
  Administered 2019-06-22: 30 mg via INTRAVENOUS

## 2019-06-22 MED ORDER — FENTANYL CITRATE (PF) 100 MCG/2ML IJ SOLN
INTRAMUSCULAR | Status: DC | PRN
  Administered 2019-06-22: 50 ug via INTRAVENOUS

## 2019-06-22 MED ORDER — PHENYLEPHRINE HCL (PRESSORS) 10 MG/ML IV SOLN
INTRAVENOUS | Status: DC | PRN
  Administered 2019-06-22 (×2): 80 ug via INTRAVENOUS

## 2019-06-22 MED ORDER — BUPIVACAINE LIPOSOME 1.3 % IJ SUSP
INTRAMUSCULAR | Status: DC | PRN
  Administered 2019-06-22: 20 mL

## 2019-06-22 MED ORDER — MUPIROCIN 2 % EX OINT
1.0000 "application " | TOPICAL_OINTMENT | Freq: Two times a day (BID) | CUTANEOUS | Status: DC
  Administered 2019-06-22 – 2019-06-25 (×5): 1 via NASAL
  Filled 2019-06-22: qty 22

## 2019-06-22 MED ORDER — LIDOCAINE 2% (20 MG/ML) 5 ML SYRINGE
INTRAMUSCULAR | Status: DC | PRN
  Administered 2019-06-22: 40 mg via INTRAVENOUS

## 2019-06-22 MED ORDER — HEPARIN (PORCINE) 25000 UT/250ML-% IV SOLN
1050.0000 [IU]/h | INTRAVENOUS | Status: DC
  Administered 2019-06-23: 1050 [IU]/h via INTRAVENOUS
  Filled 2019-06-22: qty 250

## 2019-06-22 SURGICAL SUPPLY — 35 items
ADH SKN CLS APL DERMABOND .7 (GAUZE/BANDAGES/DRESSINGS) ×1
APL PRP STRL LF DISP 70% ISPRP (MISCELLANEOUS) ×1
BLADE SURG SZ11 CARB STEEL (BLADE) ×3 IMPLANT
CHLORAPREP W/TINT 26 (MISCELLANEOUS) ×3 IMPLANT
CLOTH BEACON ORANGE TIMEOUT ST (SAFETY) ×3 IMPLANT
COVER LIGHT HANDLE STERIS (MISCELLANEOUS) ×6 IMPLANT
COVER WAND RF STERILE (DRAPES) ×3 IMPLANT
DERMABOND ADVANCED (GAUZE/BANDAGES/DRESSINGS) ×2
DERMABOND ADVANCED .7 DNX12 (GAUZE/BANDAGES/DRESSINGS) ×1 IMPLANT
ELECT REM PT RETURN 9FT ADLT (ELECTROSURGICAL) ×3
ELECTRODE REM PT RTRN 9FT ADLT (ELECTROSURGICAL) ×1 IMPLANT
GLOVE BIO SURGEON STRL SZ 6.5 (GLOVE) ×1 IMPLANT
GLOVE BIO SURGEONS STRL SZ 6.5 (GLOVE) ×1
GLOVE BIOGEL PI IND STRL 7.0 (GLOVE) ×2 IMPLANT
GLOVE BIOGEL PI INDICATOR 7.0 (GLOVE) ×4
GLOVE SURG SS PI 7.5 STRL IVOR (GLOVE) ×3 IMPLANT
GOWN STRL REUS W/TWL LRG LVL3 (GOWN DISPOSABLE) ×6 IMPLANT
INST SET MINOR GENERAL (KITS) ×3 IMPLANT
KIT TURNOVER KIT A (KITS) ×3 IMPLANT
MANIFOLD NEPTUNE II (INSTRUMENTS) ×3 IMPLANT
MESH VENTRALEX ST 2.5 CRC MED (Mesh General) ×2 IMPLANT
NDL HYPO 18GX1.5 BLUNT FILL (NEEDLE) ×1 IMPLANT
NDL HYPO 21X1.5 SAFETY (NEEDLE) ×1 IMPLANT
NEEDLE HYPO 18GX1.5 BLUNT FILL (NEEDLE) ×3 IMPLANT
NEEDLE HYPO 21X1.5 SAFETY (NEEDLE) ×3 IMPLANT
NS IRRIG 1000ML POUR BTL (IV SOLUTION) ×3 IMPLANT
PACK MINOR (CUSTOM PROCEDURE TRAY) ×3 IMPLANT
PAD ARMBOARD 7.5X6 YLW CONV (MISCELLANEOUS) ×3 IMPLANT
SET BASIN LINEN APH (SET/KITS/TRAYS/PACK) ×3 IMPLANT
SUT ETHIBOND NAB MO 7 #0 18IN (SUTURE) ×3 IMPLANT
SUT MNCRL AB 4-0 PS2 18 (SUTURE) ×3 IMPLANT
SUT VIC AB 2-0 CT2 27 (SUTURE) ×3 IMPLANT
SUT VIC AB 3-0 SH 27 (SUTURE) ×3
SUT VIC AB 3-0 SH 27X BRD (SUTURE) ×1 IMPLANT
SYR 20CC LL (SYRINGE) ×6 IMPLANT

## 2019-06-22 NOTE — Anesthesia Postprocedure Evaluation (Signed)
Anesthesia Post Note  Patient: Hannah [name]  Procedure(s) Performed: HERNIA REPAIR UMBILICAL ADULT (N/A Abdomen)  Patient location during evaluation: PACU Anesthesia Type: General Level of consciousness: awake and alert and oriented Pain management: pain level controlled Vital Signs Assessment: post-procedure vital signs reviewed and stable Respiratory status: spontaneous breathing Cardiovascular status: blood pressure returned to baseline and stable Postop Assessment: no apparent nausea or vomiting Anesthetic complications: no     Last Vitals:  Vitals:   06/22/19 1221 06/22/19 1231  BP:  120/86  Pulse:  79  Resp:  (!) 23  Temp:    SpO2: 97% 96%    Last Pain:  Vitals:   06/22/19 1219  TempSrc:   PainSc: 0-No pain                 Amyah Clawson

## 2019-06-22 NOTE — Op Note (Signed)
Patient:  Hannah [name]  DOB:  05/14/1941  MRN:  [id number]   Preop Diagnosis: Umbilical hernia, small bowel obstruction  Postop Diagnosis: Same  Procedure: Umbilical herniorrhaphy with mesh  Surgeon: Aviva Signs, MD  Anes: General endotracheal  Indications: Patient is a 78 year old white female who presented to the emergency room with recurrent episodes of incarceration and bowel obstruction secondary to an umbilical hernia.  The risks and benefits of the procedure including bleeding, infection, mesh use, the possibility of recurrence of the hernia were fully explained to the patient, who gave informed consent.  Procedure note: The patient was placed in the supine position.  After induction of general endotracheal anesthesia, the abdomen was prepped and draped using the usual sterile technique with ChloraPrep.  Surgical site confirmation was performed.  An infraumbilical incision was made down to the fascia.  The patient had a well-formed hernia sac and this was freed away from the umbilicus without difficulty.  The hernia sac was excised down to the fascia.  The underlying small bowel was limitedly inspected and there was no evidence of injury.  The defect measured approximately 3 to 4 cm in its greatest diameter.  A 6.4 cm Bard Ventralax ST patch was then inserted and secured to the fascia using 0 Ethibond interrupted sutures.  The overlying fascia was then reapproximated transversely using 0 Ethibond interrupted sutures.  The umbilicus was secured back to the fascia using a 2-0 Vicryl interrupted suture.  The subcutaneous layer was reapproximated using a 3-0 Vicryl interrupted suture.  The skin was closed using a 4-0 Monocryl subcuticular suture.  Exparel was instilled into the surrounding wound.  Dermabond was applied.  All tape and needle counts were correct at the end of the procedure.  The patient was extubated in the operating room and transferred to PACU in stable condition.  Of note  was the fact that an orogastric tube was placed and a significant amount of gastric contents were aspirated.  Complications: None  EBL: Minimal  Specimen: None

## 2019-06-22 NOTE — Anesthesia Procedure Notes (Signed)
Procedure Name: Intubation Date/Time: 06/22/2019 11:37 AM Performed by: Andree Elk, Amy A, CRNA Pre-anesthesia Checklist: Patient identified, Patient being monitored, Timeout performed, Emergency Drugs available and Suction available Patient Re-evaluated:Patient Re-evaluated prior to induction Oxygen Delivery Method: Circle system utilized Preoxygenation: Pre-oxygenation with 100% oxygen Induction Type: IV induction, Rapid sequence and Cricoid Pressure applied Ventilation: Mask ventilation without difficulty Laryngoscope Size: Mac and 3 Grade View: Grade I Tube type: Oral Tube size: 7.0 mm Number of attempts: 1 Airway Equipment and Method: Stylet Placement Confirmation: ETT inserted through vocal cords under direct vision,  positive ETCO2 and breath sounds checked- equal and bilateral Secured at: 21 cm Tube secured with: Tape Dental Injury: Teeth and Oropharynx as per pre-operative assessment

## 2019-06-22 NOTE — Progress Notes (Signed)
PROGRESS NOTE    Hannah [name]  ZOX:[id number] DOB: Apr 18, 1941 DOA: 06/20/2019 PCP: Remus Loffler, PA-C   Brief Narrative:  Per HPI: Hannah [name] is a 78 y.o. female with medical history significant for hypertension, hyperlipidemia hypothyroidism who presented to the ED with complaints of abdominal pain and multiple episodes of vomiting over the past 5 days.  Patient last bowel movement was 5 days ago.  No fever no chills. She also reports a chronic intermittent dry cough that is unchanged, she denies difficulty breathing, chest pain.  She never smoked cigarettes.  Denies lower extremity swelling.  No history of blood clots in lungs or legs.  She is fairly active, lives alone, mows her own lawn. Daughter is an Nutritional therapist here at [employer]. Patient initially presented at the urgent care and was subsequently referred to the ED.  Patient was admitted with incarcerated umbilical hernia along with AKI likely secondary to poor oral intake.  She was noted to have some hypoxemia as well and upper respiratory symptoms with chest x-ray that was noted to be negative.  VQ scan was ordered and was negative for PE, but only perfusion scan was performed.  Patient has been started on heparin drip.  Assessment & Plan:   Active Problems:   AKI (acute kidney injury) (HCC)   Incarcerated umbilical hernia   Acute kidney injury- creatinine 2.9, baseline ~0.9 (per care everywhere).  With hypotension.  Likely prerenal from vomiting, poor p.o. intake and ongoing lisinopril- HCTZ use.   -Continue IV fluids-continue holding nephrotoxic agents -Follow renal function trend -Appropriately improving. -Repeat bmet in a.m.  Hypoxia- possibly secondary to PE -VQ scan with only perfusion side performed demonstrating no PE currently -Wells score 4.5 indicating moderate to high risk for PE and will need CTA to further decide need of anticoagulation. -Now that her renal function has improved we will anticipate CT chest  to rule out PE on 7/23. -Continue IV heparin drip for now  -Chest x-ray negative suggesting no other etiology currently -2D echocardiogram: Normal systolic function with ejection fraction 6065%.  Impair diastolic relaxation parameters; no wall motion abnormalities.  Right ventricle normal systolic function appreciated.  No significant valvular stenosis.  Incarcerated umbilical hernia-manually reduced in the ED.   -Dr. Lovell Sheehan for surgery later today. -will resume heparin drip once clear by surgery  Hypertension-stable overall. -Continue IV fluid for now -Continue to hold home lisinopril and HCTZ -Restart home medications once renal function completely recovered and patient tolerating by mouth.  Hypothyroidism -Resume Synthroid.   DVT prophylaxis: Heparin drip Code Status: Full Family Communication: None at bedside Disposition Plan: Plans for surgery later today.  Will anticipate CT chest PE protocol 06/23/2019 in the morning to decide need of future anticoagulation.  Renal function improving,  Will follow trend.  Consultants:   General surgery  Procedures:   None  Antimicrobials:   None   Subjective: No fever, no chest pain, no nausea or vomiting.  Still requiring 3 L oxygen supplementation; but reporting overall improving her breathing.  Objective: Vitals:   06/22/19 1231 06/22/19 1246 06/22/19 1308 06/22/19 1507  BP: 120/86 133/62 (!) 124/57 135/62  Pulse: 79 75 72 81  Resp: (!) 23 17    Temp:    97.8 F (36.6 C)  TempSrc:    Oral  SpO2: 96% 96% 100% 99%  Weight:      Height:        Intake/Output Summary (Last 24 hours) at 06/22/2019 1705 Last data filed at  06/22/2019 1246 Gross per 24 hour  Intake 700 ml  Output 1505 ml  Net -805 ml   Filed Weights   06/20/19 1424 06/20/19 2246  Weight: 78.9 kg 80.4 kg    Examination: General exam: Alert, awake, oriented; in no acute distress currently.  Reports breathing continue slowly improving.  Using 3 L nasal  cannula supplementation.  No chest pain, no nausea, no vomiting. Respiratory system: No wheezing, no crackles.  No using accessory muscles. Cardiovascular system:RRR. No murmurs, rubs, gallops. Gastrointestinal system: Abdomen is nondistended, soft and nontender.  Positive bowel sounds.  Easily reducible umbilical hernia. Central nervous system: Alert and oriented. No focal neurological deficits. Extremities: No C/C/E, +pedal pulses Skin: No rashes, lesions or ulcers Psychiatry: Judgement and insight appear normal. Mood & affect appropriate.    Data Reviewed: I have personally reviewed following labs and imaging studies  CBC: Recent Labs  Lab 06/20/19 1516 06/21/19 0415 06/22/19 0623  WBC 11.1* 9.4 5.9  HGB 15.8* 13.6 13.2  HCT 47.8* 41.2 41.5  MCV 87.7 88.0 91.0  PLT 254 170 125*   Basic Metabolic Panel: Recent Labs  Lab 06/20/19 1516 06/21/19 0415 06/22/19 0623  NA 141 143 147*  K 3.5 3.2* 3.7  CL 96* 98 106  CO2 30 31 31   GLUCOSE 168* 153* 159*  BUN 47* 54* 45*  CREATININE 2.90* 2.74* 1.25*  CALCIUM 10.7* 9.4 9.0  MG  --   --  2.0   GFR: Estimated Creatinine Clearance: 39.6 mL/min (A) (by C-G formula based on SCr of 1.25 mg/dL (H)).   Liver Function Tests: Recent Labs  Lab 06/20/19 1516  AST 26  ALT 18  ALKPHOS 79  BILITOT 1.5*  PROT 8.7*  ALBUMIN 5.0   Recent Labs  Lab 06/20/19 1516  LIPASE 30   Coagulation Profile: Recent Labs  Lab 06/21/19 0034  INR 1.1    Recent Results (from the past 240 hour(s))  SARS Coronavirus 2 (CEPHEID - Performed in [facility] hospital lab), Hosp Order     Status: None   Collection Time: 06/20/19  5:28 PM   Specimen: Nasopharyngeal Swab  Result Value Ref Range Status   SARS Coronavirus 2 NEGATIVE NEGATIVE Final    Comment: (NOTE) If result is NEGATIVE SARS-CoV-2 target nucleic acids are NOT DETECTED. The SARS-CoV-2 RNA is generally detectable in upper and lower  respiratory specimens during the acute phase of  infection. The lowest  concentration of SARS-CoV-2 viral copies this assay can detect is 250  copies / mL. A negative result does not preclude SARS-CoV-2 infection  and should not be used as the sole basis for treatment or other  patient management decisions.  A negative result may occur with  improper specimen collection / handling, submission of specimen other  than nasopharyngeal swab, presence of viral mutation(s) within the  areas targeted by this assay, and inadequate number of viral copies  (<250 copies / mL). A negative result must be combined with clinical  observations, patient history, and epidemiological information. If result is POSITIVE SARS-CoV-2 target nucleic acids are DETECTED. The SARS-CoV-2 RNA is generally detectable in upper and lower  respiratory specimens dur ing the acute phase of infection.  Positive  results are indicative of active infection with SARS-CoV-2.  Clinical  correlation with patient history and other diagnostic information is  necessary to determine patient infection status.  Positive results do  not rule out bacterial infection or co-infection with other viruses. If result is PRESUMPTIVE POSTIVE SARS-CoV-2 nucleic  acids MAY BE PRESENT.   A presumptive positive result was obtained on the submitted specimen  and confirmed on repeat testing.  While 2019 novel coronavirus  (SARS-CoV-2) nucleic acids may be present in the submitted sample  additional confirmatory testing may be necessary for epidemiological  and / or clinical management purposes  to differentiate between  SARS-CoV-2 and other Sarbecovirus currently known to infect humans.  If clinically indicated additional testing with an alternate test  methodology [id number]) is advised. The SARS-CoV-2 RNA is generally  detectable in upper and lower respiratory sp ecimens during the acute  phase of infection. The expected result is Negative. Fact Sheet for Patients:   BoilerBrush.com.cy Fact Sheet for Healthcare Providers: https://pope.com/ This test is not yet approved or cleared by the [place] FDA and has been authorized for detection and/or diagnosis of SARS-CoV-2 by FDA under an Emergency Use Authorization (EUA).  This EUA will remain in effect (meaning this test can be used) for the duration of the COVID-19 declaration under Section 564(b)(1) of the Act, 21 U.S.C. section 360bbb-3(b)(1), unless the authorization is terminated or revoked sooner. Performed at [facility], [address] [place], [place] [address]   Surgical pcr screen     Status: Abnormal   Collection Time: 06/21/19  8:09 PM   Specimen: Nasal Mucosa; Nasal Swab  Result Value Ref Range Status   MRSA, PCR NEGATIVE NEGATIVE Final   Staphylococcus aureus POSITIVE (A) NEGATIVE Final    Comment: (NOTE) The Xpert SA Assay (FDA approved for NASAL specimens in patients 33 years of age and older), is one component of a comprehensive surveillance program. It is not intended to diagnose infection nor to guide or monitor treatment. Performed at [facility], [address], [place], [place] [address]      Radiology Studies: Ct Abdomen Pelvis Wo Contrast  Result Date: 06/20/2019 CLINICAL DATA:  Abdominal pain for several days EXAM: CT ABDOMEN AND PELVIS WITHOUT CONTRAST TECHNIQUE: Multidetector CT imaging of the abdomen and pelvis was performed following the standard protocol without IV contrast. COMPARISON:  None. FINDINGS: Lower chest: No acute abnormality. Hepatobiliary: Liver is well visualized within normal limits. The gallbladder is well distended with 2 heavily calcified gallstones. No wall thickening or pericholecystic fluid is noted. Pancreas: Unremarkable. No pancreatic ductal dilatation or surrounding inflammatory changes. Spleen: Normal in size without focal abnormality. Adrenals/Urinary Tract: Adrenal glands are within normal  limits. Kidneys are well visualized bilaterally. No renal calculi or obstructive changes are seen. The bladder is well distended. Stomach/Bowel: Diverticular change of the colon is identified without inflammatory change. The colon is predominately decompressed however. The appendix has been surgically removed. There is an incarcerated loop of small bowel identified within an umbilical hernia with proximal small bowel dilatation extending from the stomach to the level of the hernia. The small bowel distal to the hernia is completely decompressed comprising almost the entire ileum. Vascular/Lymphatic: Aortic atherosclerosis. No enlarged abdominal or pelvic lymph nodes. Reproductive: Status post hysterectomy. No adnexal masses. Other: No abdominal wall hernia or abnormality. No abdominopelvic ascites. Musculoskeletal: No acute or significant osseous findings. IMPRESSION: Incarcerated umbilical hernia containing a loop of small bowel likely near the junction of the jejunum and ileum with proximal small bowel obstruction. Cholelithiasis without complicating factors. Diverticulosis without diverticulitis. Electronically Signed   By: Alcide Clever M.D.   On: 06/20/2019 18:17   Nm Pulmonary Perfusion  Result Date: 06/21/2019 CLINICAL DATA:  78 year old female with left-sided chest pain and nausea. Hypoxia. EXAM: NUCLEAR MEDICINE  PERFUSION LUNG SCAN TECHNIQUE: Perfusion images were obtained in multiple projections after intravenous injection of radiopharmaceutical. Ventilation scans intentionally deferred if perfusion scan and chest x-ray adequate for interpretation during COVID 19 epidemic. RADIOPHARMACEUTICALS:  1.6 mCi Tc-50m MAA IV COMPARISON:  Chest radiograph dated 06/20/2019 FINDINGS: Perfusion study demonstrates a homogeneous and uniform uptake within the lungs. No large or segmental perfusion defect identified. The ventilation scan was not performed. IMPRESSION: No perfusion defect to suggest pulmonary embolism.  Electronically Signed   By: Elgie Collard M.D.   On: 06/21/2019 00:07   Dg Chest Port 1 View  Result Date: 06/20/2019 CLINICAL DATA:  Abdominal pain.  Low O2 saturation. EXAM: PORTABLE CHEST 1 VIEW COMPARISON:  None. FINDINGS: The heart size and mediastinal contours are within normal limits. Both lungs are clear. The visualized skeletal structures are unremarkable. IMPRESSION: Normal exam. Electronically Signed   By: Francene Boyers M.D.   On: 06/20/2019 17:48    Scheduled Meds: . Chlorhexidine Gluconate Cloth  6 each Topical Daily  . fentaNYL (SUBLIMAZE) injection  50 mcg Intravenous Once  . mupirocin ointment  1 application Nasal BID  . sodium chloride flush  3 mL Intravenous Once   Continuous Infusions: . dextrose 5 % and 0.9 % NaCl with KCl 20 mEq/L 100 mL/hr at 06/22/19 0555  . heparin       LOS: 1 day    Time spent: 30 minutes    Vassie Loll, DO [facility] Pager [phone]  06/22/2019, 5:05 PM

## 2019-06-22 NOTE — Anesthesia Preprocedure Evaluation (Signed)
Anesthesia Evaluation  Patient identified by MRN, date of birth, ID band Patient awake    Reviewed: Allergy & Precautions, NPO status , Patient's Chart, lab work & pertinent test results  Airway Mallampati: I  TM Distance: >3 FB Neck ROM: Full    Dental no notable dental hx. (+) Teeth Intact   Pulmonary neg pulmonary ROS,    Pulmonary exam normal breath sounds clear to auscultation       Cardiovascular Exercise Tolerance: Good hypertension, Pt. on medications negative cardio ROS Normal cardiovascular examI Rhythm:Regular Rate:Normal  States PTA was active and mowed her own grass  States no known cardiac issues or interventions  Came in SOB with AKI which is resolving    Neuro/Psych negative neurological ROS  negative psych ROS   GI/Hepatic negative GI ROS, Neg liver ROS, Continues with N/V, SBO related to hernia   Endo/Other  Hypothyroidism   Renal/GU Renal InsufficiencyRenal diseaseAKI, resolving last Cr 1.25 today , down from 2.74  negative genitourinary   Musculoskeletal negative musculoskeletal ROS (+)   Abdominal   Peds negative pediatric ROS (+)  Hematology negative hematology ROS (+)   Anesthesia Other Findings   Reproductive/Obstetrics negative OB ROS                             Anesthesia Physical Anesthesia Plan  ASA: III  Anesthesia Plan: General   Post-op Pain Management:    Induction: Intravenous  PONV Risk Score and Plan: 2 and Ondansetron and Treatment may vary due to age or medical condition  Airway Management Planned: Oral ETT  Additional Equipment:   Intra-op Plan:   Post-operative Plan: Extubation in OR  Informed Consent: I have reviewed the patients History and Physical, chart, labs and discussed the procedure including the risks, benefits and alternatives for the proposed anesthesia with the patient or authorized representative who has indicated  his/her understanding and acceptance.     Dental advisory given  Plan Discussed with: CRNA  Anesthesia Plan Comments: (Plan full PPE Plan GETA-d/w pt -WTP with same -will rapid sequence  )        Anesthesia Quick Evaluation

## 2019-06-22 NOTE — Transfer of Care (Signed)
Immediate Anesthesia Transfer of Care Note  Patient: Hannah [name]  Procedure(s) Performed: HERNIA REPAIR UMBILICAL ADULT (N/A Abdomen)  Patient Location: PACU  Anesthesia Type:General  Level of Consciousness: awake, oriented and patient cooperative  Airway & Oxygen Therapy: Patient Spontanous Breathing and Patient connected to nasal cannula oxygen  Post-op Assessment: Report given to RN and Post -op Vital signs reviewed and stable  Post vital signs: Reviewed and stable  Last Vitals:  Vitals Value Taken Time  BP    Temp    Pulse 87 06/22/19 1221  Resp 22 06/22/19 1221  SpO2 90 % 06/22/19 1221  Vitals shown include unvalidated device data.  Last Pain:  Vitals:   06/22/19 1019  TempSrc: Oral  PainSc: 0-No pain      Patients Stated Pain Goal: 0 (63/84/66 [id number])  Complications: No apparent anesthesia complications

## 2019-06-22 NOTE — Interval H&P Note (Signed)
History and Physical Interval Note:  06/22/2019 10:43 AM  Hannah [name]  has presented today for surgery, with the diagnosis of umbilical hernia.  The various methods of treatment have been discussed with the patient and family. After consideration of risks, benefits and other options for treatment, the patient has consented to  Procedure(s): HERNIA REPAIR UMBILICAL ADULT (N/A) as a surgical intervention.  The patient's history has been reviewed, patient examined, no change in status, stable for surgery.  I have reviewed the patient's chart and labs.  Questions were answered to the patient's satisfaction.     Aviva Signs

## 2019-06-22 NOTE — Care Management Important Message (Signed)
Important Message  Patient Details  Name: Hannah [name] MRN: [id number] Date of Birth: 08-Oct-1941   Medicare Important Message Given:  Yes     Tommy Medal 06/22/2019, 1:45 PM

## 2019-06-22 NOTE — Progress Notes (Signed)
*  PRELIMINARY RESULTS* Echocardiogram 2D Echocardiogram has been performed.  Hannah [name] 06/22/2019, 9:30 AM

## 2019-06-23 ENCOUNTER — Encounter (HOSPITAL_COMMUNITY): Payer: Self-pay | Admitting: Radiology

## 2019-06-23 ENCOUNTER — Inpatient Hospital Stay (HOSPITAL_COMMUNITY): Payer: Medicare Other

## 2019-06-23 DIAGNOSIS — R109 Unspecified abdominal pain: Secondary | ICD-10-CM

## 2019-06-23 LAB — BASIC METABOLIC PANEL
Anion gap: 7 (ref 5–15)
BUN: 35 mg/dL — ABNORMAL HIGH (ref 8–23)
CO2: 26 mmol/L (ref 22–32)
Calcium: 8.1 mg/dL — ABNORMAL LOW (ref 8.9–10.3)
Chloride: 110 mmol/L (ref 98–111)
Creatinine, Ser: 0.96 mg/dL (ref 0.44–1.00)
GFR calc Af Amer: >60 mL/min (ref >60)
GFR calc non Af Amer: 57 mL/min — ABNORMAL LOW (ref >60)
Glucose, Bld: 119 mg/dL — ABNORMAL HIGH (ref 70–99)
Potassium: 4 mmol/L (ref 3.5–5.1)
Sodium: 143 mmol/L (ref 135–145)

## 2019-06-23 LAB — CBC
HCT: 38.3 % (ref 36.0–46.0)
Hemoglobin: 12.1 g/dL (ref 12.0–15.0)
MCH: 28.9 pg (ref 26.0–34.0)
MCHC: 31.6 g/dL (ref 30.0–36.0)
MCV: 91.4 fL (ref 80.0–100.0)
Platelets: 106 10*3/uL — ABNORMAL LOW (ref 150–400)
RBC: 4.19 MIL/uL (ref 3.87–5.11)
RDW: 13.5 % (ref 11.5–15.5)
WBC: 6.8 10*3/uL (ref 4.0–10.5)
nRBC: 0 % (ref 0.0–0.2)

## 2019-06-23 LAB — HEPARIN LEVEL (UNFRACTIONATED)
Heparin Unfractionated: 0.32 IU/mL (ref 0.30–0.70)
Heparin Unfractionated: 0.41 IU/mL (ref 0.30–0.70)

## 2019-06-23 LAB — PHOSPHORUS: Phosphorus: 1.5 mg/dL — ABNORMAL LOW (ref 2.5–4.6)

## 2019-06-23 LAB — MAGNESIUM: Magnesium: 1.9 mg/dL (ref 1.7–2.4)

## 2019-06-23 MED ORDER — IOHEXOL 350 MG/ML SOLN
100.0000 mL | Freq: Once | INTRAVENOUS | Status: AC | PRN
  Administered 2019-06-23: 100 mL via INTRAVENOUS

## 2019-06-23 MED ORDER — POTASSIUM PHOSPHATES 15 MMOLE/5ML IV SOLN
20.0000 meq | Freq: Once | INTRAVENOUS | Status: AC
  Administered 2019-06-23: 20 meq via INTRAVENOUS
  Filled 2019-06-23: qty 4.55

## 2019-06-23 MED ORDER — HEPARIN SODIUM (PORCINE) 5000 UNIT/ML IJ SOLN
5000.0000 [IU] | Freq: Three times a day (TID) | INTRAMUSCULAR | Status: DC
  Administered 2019-06-23 – 2019-06-25 (×4): 5000 [IU] via SUBCUTANEOUS
  Filled 2019-06-23 (×5): qty 1

## 2019-06-23 NOTE — Progress Notes (Signed)
PROGRESS NOTE    Hannah [name]  ZOX:[id number] DOB: 1941/10/25 DOA: 06/20/2019 PCP: Remus Loffler, PA-C   Brief Narrative:  Per HPI: Hannah [name] is a 78 y.o. female with medical history significant for hypertension, hyperlipidemia hypothyroidism who presented to the ED with complaints of abdominal pain and multiple episodes of vomiting over the past 5 days.  Patient last bowel movement was 5 days ago.  No fever no chills. She also reports a chronic intermittent dry cough that is unchanged, she denies difficulty breathing, chest pain.  She never smoked cigarettes.  Denies lower extremity swelling.  No history of blood clots in lungs or legs.  She is fairly active, lives alone, mows her own lawn. Daughter is an Nutritional therapist here at [employer]. Patient initially presented at the urgent care and was subsequently referred to the ED.  Patient was admitted with incarcerated umbilical hernia along with AKI likely secondary to poor oral intake.  She was noted to have some hypoxemia as well and upper respiratory symptoms with chest x-ray that was noted to be negative.  VQ scan was ordered and was negative for PE, but only perfusion scan was performed.  Patient has been started on heparin drip.  Assessment & Plan:   Active Problems:   AKI (acute kidney injury) (HCC)   Incarcerated umbilical hernia   Acute kidney injury- creatinine 2.9, baseline ~0.9 (per care everywhere).  With hypotension.  Likely prerenal from vomiting, poor p.o. intake and ongoing lisinopril- HCTZ use.   -Improve after fluid resuscitation and discontinuation nephrotoxic agent -Renal function is back to normal -Continue to follow trend.  Hypoxia- possibly secondary to PE -VQ scan with only perfusion side performed demonstrating no PE currently -Wells score 4.5 indicating moderate to high risk for PE -Status post CT Angie demonstrating no acute pulmonary embolism. -Anticoagulation will be discontinued. -Continue weaning oxygen  supplementation to room air as tolerated. -Chest x-ray negative suggesting no other etiology currently -2D echocardiogram: Normal systolic function with ejection fraction 6065%.  Impair diastolic relaxation parameters; no wall motion abnormalities.  Right ventricle normal systolic function appreciated.  No significant valvular stenosis.  Incarcerated umbilical hernia- -Status post surgical repair -We will follow general surgery recommendation postoperatively.  Hypertension-stable overall. -Continue to hold home lisinopril and HCTZ for another 24 hours. -Restart home medications once renal function completely recovered and patient tolerating full by mouth diet.  Hypothyroidism -Resume Synthroid.   DVT prophylaxis: Heparin  Code Status: Full Family Communication: None at bedside Disposition Plan: Status post hernia repair.  Surgery well-tolerated.  Denies chest pain, no nausea, no vomiting.  Breathing improved and renal function back to normal.  Consultants:   General surgery  Procedures:   None  Antimicrobials:   None   Subjective: No fever, no chest pain, no nausea, no vomiting.  Tolerating clear liquid diet.  Still no bowel movements or flatus.  Objective: Vitals:   06/23/19 0338 06/23/19 0520 06/23/19 1314 06/23/19 1341  BP: 120/62 (!) 116/59 123/66 (!) 144/71  Pulse: 86 72 75 98  Resp: 16 17 18 17   Temp: 98.6 F (37 C) 98.4 F (36.9 C) 98.5 F (36.9 C) 98.4 F (36.9 C)  TempSrc:   Oral Oral  SpO2:  95% 96% 97%  Weight:      Height:        Intake/Output Summary (Last 24 hours) at 06/23/2019 1807 Last data filed at 06/23/2019 1100 Gross per 24 hour  Intake 2790.27 ml  Output 350 ml  Net 2440.27  ml   Filed Weights   06/20/19 1424 06/20/19 2246  Weight: 78.9 kg 80.4 kg    Examination: General exam: Alert, awake, oriented x 3; no fever.  Reports improvement in her breathing.  Still using 1 L nasal cannula supplementation.  No chest pain, no nausea, no  vomiting. Respiratory system: Positive scattered rhonchi right, no wheezing, no crackles.  Respiratory effort normal. Cardiovascular system:RRR. No murmurs, rubs, gallops. Gastrointestinal system: Abdomen is soft and nontender. No organomegaly or masses felt. Normal bowel sounds heard. Central nervous system: Alert and oriented. No focal neurological deficits. Extremities: No C/C/E, +pedal pulses Skin: No rashes, lesions or ulcers Psychiatry: Judgement and insight appear normal. Mood & affect appropriate.    Data Reviewed: I have personally reviewed following labs and imaging studies  CBC: Recent Labs  Lab 06/20/19 1516 06/21/19 0415 06/22/19 0623 06/23/19 0732  WBC 11.1* 9.4 5.9 6.8  HGB 15.8* 13.6 13.2 12.1  HCT 47.8* 41.2 41.5 38.3  MCV 87.7 88.0 91.0 91.4  PLT 254 170 125* 106*   Basic Metabolic Panel: Recent Labs  Lab 06/20/19 1516 06/21/19 0415 06/22/19 0623 06/23/19 0732  NA 141 143 147* 143  K 3.5 3.2* 3.7 4.0  CL 96* 98 106 110  CO2 30 31 31 26   GLUCOSE 168* 153* 159* 119*  BUN 47* 54* 45* 35*  CREATININE 2.90* 2.74* 1.25* 0.96  CALCIUM 10.7* 9.4 9.0 8.1*  MG  --   --  2.0 1.9  PHOS  --   --   --  1.5*   GFR: Estimated Creatinine Clearance: 51.6 mL/min (by C-G formula based on SCr of 0.96 mg/dL).   Liver Function Tests: Recent Labs  Lab 06/20/19 1516  AST 26  ALT 18  ALKPHOS 79  BILITOT 1.5*  PROT 8.7*  ALBUMIN 5.0   Recent Labs  Lab 06/20/19 1516  LIPASE 30   Coagulation Profile: Recent Labs  Lab 06/21/19 0034  INR 1.1    Recent Results (from the past 240 hour(s))  SARS Coronavirus 2 (CEPHEID - Performed in [facility] hospital lab), Hosp Order     Status: None   Collection Time: 06/20/19  5:28 PM   Specimen: Nasopharyngeal Swab  Result Value Ref Range Status   SARS Coronavirus 2 NEGATIVE NEGATIVE Final    Comment: (NOTE) If result is NEGATIVE SARS-CoV-2 target nucleic acids are NOT DETECTED. The SARS-CoV-2 RNA is generally  detectable in upper and lower  respiratory specimens during the acute phase of infection. The lowest  concentration of SARS-CoV-2 viral copies this assay can detect is 250  copies / mL. A negative result does not preclude SARS-CoV-2 infection  and should not be used as the sole basis for treatment or other  patient management decisions.  A negative result may occur with  improper specimen collection / handling, submission of specimen other  than nasopharyngeal swab, presence of viral mutation(s) within the  areas targeted by this assay, and inadequate number of viral copies  (<250 copies / mL). A negative result must be combined with clinical  observations, patient history, and epidemiological information. If result is POSITIVE SARS-CoV-2 target nucleic acids are DETECTED. The SARS-CoV-2 RNA is generally detectable in upper and lower  respiratory specimens dur ing the acute phase of infection.  Positive  results are indicative of active infection with SARS-CoV-2.  Clinical  correlation with patient history and other diagnostic information is  necessary to determine patient infection status.  Positive results do  not rule out bacterial  infection or co-infection with other viruses. If result is PRESUMPTIVE POSTIVE SARS-CoV-2 nucleic acids MAY BE PRESENT.   A presumptive positive result was obtained on the submitted specimen  and confirmed on repeat testing.  While 2019 novel coronavirus  (SARS-CoV-2) nucleic acids may be present in the submitted sample  additional confirmatory testing may be necessary for epidemiological  and / or clinical management purposes  to differentiate between  SARS-CoV-2 and other Sarbecovirus currently known to infect humans.  If clinically indicated additional testing with an alternate test  methodology [phone]) is advised. The SARS-CoV-2 RNA is generally  detectable in upper and lower respiratory sp ecimens during the acute  phase of infection. The  expected result is Negative. Fact Sheet for Patients:  BoilerBrush.com.cy Fact Sheet for Healthcare Providers: https://pope.com/ This test is not yet approved or cleared by the [place] FDA and has been authorized for detection and/or diagnosis of SARS-CoV-2 by FDA under an Emergency Use Authorization (EUA).  This EUA will remain in effect (meaning this test can be used) for the duration of the COVID-19 declaration under Section 564(b)(1) of the Act, 21 U.S.C. section 360bbb-3(b)(1), unless the authorization is terminated or revoked sooner. Performed at [facility], [address] [place], [place] [address]   Surgical pcr screen     Status: Abnormal   Collection Time: 06/21/19  8:09 PM   Specimen: Nasal Mucosa; Nasal Swab  Result Value Ref Range Status   MRSA, PCR NEGATIVE NEGATIVE Final   Staphylococcus aureus POSITIVE (A) NEGATIVE Final    Comment: (NOTE) The Xpert SA Assay (FDA approved for NASAL specimens in patients 56 years of age and older), is one component of a comprehensive surveillance program. It is not intended to diagnose infection nor to guide or monitor treatment. Performed at [facility], [address] [place], [place] [address]      Radiology Studies: Ct Angio Chest Pe W Or Wo Contrast  Result Date: 06/23/2019 CLINICAL DATA:  Hypoxia.  Positive D-dimer. EXAM: CT ANGIOGRAPHY CHEST WITH CONTRAST TECHNIQUE: Multidetector CT imaging of the chest was performed using the standard protocol during bolus administration of intravenous contrast. Multiplanar CT image reconstructions and MIPs were obtained to evaluate the vascular anatomy. CONTRAST:  OMNIPAQUE IOHEXOL 350 MG/ML SOLN COMPARISON:  None. FINDINGS: Cardiovascular: No filling defects in the pulmonary arteries to suggest pulmonary emboli. Coronary artery calcifications in the left anterior descending and left circumflex coronary arteries. Descending aortic  calcifications. No aneurysm or dissection. Mediastinum/Nodes: No mediastinal, hilar, or axillary adenopathy. Trachea and esophagus are unremarkable. Thyroid unremarkable. Lungs/Pleura: Left base opacity, likely atelectasis. Small nodule in the right middle lobe measures 5 mm. No effusions. Upper Abdomen: Gallstones noted within the gallbladder. The spleen is not imaged in its entirety, but appears enlarged. Recommend clinical correlation. Left adrenal nodule measures up to 2.8 cm. Musculoskeletal: Chest wall soft tissues are unremarkable. No acute bony abnormality. Review of the MIP images confirms the above findings. IMPRESSION: No evidence of pulmonary embolus. Coronary artery disease. 5 mm right middle lobe pulmonary nodule peripherally. No follow-up needed if patient is low-risk. Non-contrast chest CT can be considered in 12 months if patient is high-risk. This recommendation follows the consensus statement: Guidelines for Management of Incidental Pulmonary Nodules Detected on CT Images: From the Fleischner Society 2017; Radiology 2017; 284:228-243. Cholelithiasis. Spleen appears enlarged but not imaged in its entirety. Recommend clinical correlation. Aortic Atherosclerosis (ICD10-I70.0). Electronically Signed   By: Charlett Nose M.D.   On: 06/23/2019 10:24  Scheduled Meds:  Chlorhexidine Gluconate Cloth  6 each Topical Daily   fentaNYL (SUBLIMAZE) injection  50 mcg Intravenous Once   heparin injection (subcutaneous)  5,000 Units Subcutaneous Q8H   levothyroxine  75 mcg Oral Q0600   mupirocin ointment  1 application Nasal BID   sodium chloride flush  3 mL Intravenous Once   Continuous Infusions:    LOS: 2 days    Time spent: 30 minutes    Vassie Loll, DO [facility] Pager [phone]  06/23/2019, 6:07 PM

## 2019-06-23 NOTE — Anesthesia Postprocedure Evaluation (Signed)
Anesthesia Post Note  Patient: Hannah [name]  Procedure(s) Performed: HERNIA REPAIR UMBILICAL ADULT (N/A Abdomen)  Patient location during evaluation: Nursing Unit Anesthesia Type: General Level of consciousness: awake and alert and oriented Pain management: pain level controlled Vital Signs Assessment: post-procedure vital signs reviewed and stable Respiratory status: spontaneous breathing and patient connected to nasal cannula oxygen Postop Assessment: no apparent nausea or vomiting Anesthetic complications: no     Last Vitals:  Vitals:   06/23/19 0338 06/23/19 0520  BP: 120/62 (!) 116/59  Pulse: 86 72  Resp: 16 17  Temp: 37 C 36.9 C  SpO2:  95%    Last Pain:  Vitals:   06/23/19 0852  TempSrc:   PainSc: 0-No pain                 [name] A

## 2019-06-23 NOTE — Progress Notes (Signed)
ANTICOAGULATION CONSULT NOTE -   Pharmacy Consult for heparin Indication: potential PE  Allergies  Allergen Reactions  . Penicillins Rash    Did it involve swelling of the face/tongue/throat, SOB, or low BP? Unknown Did it involve sudden or severe rash/hives, skin peeling, or any reaction on the inside of your mouth or nose? Janifer Adie Did you need to seek medical attention at a hospital or doctor's office? No When did it last happen?childhood If all above answers are "NO", may proceed with cephalosporin use.   . Sulfa Antibiotics Rash    Patient Measurements: Height: 5\' 6"  (167.6 cm) Weight: 177 lb 4 oz (80.4 kg) IBW/kg (Calculated) : 59.3 Heparin Dosing Weight: 75 kg   Vital Signs: Temp: 98.4 F (36.9 C) (07/23 0520) BP: 116/59 (07/23 0520) Pulse Rate: 72 (07/23 0520)  Labs: Recent Labs    06/21/19 0034 06/21/19 0415 06/21/19 1400 06/22/19 0623 06/23/19 0732  HGB  --  13.6  --  13.2 12.1  HCT  --  41.2  --  41.5 38.3  PLT  --  170  --  125* 106*  APTT 78*  --   --   --   --   LABPROT 13.9  --   --   --   --   INR 1.1  --   --   --   --   HEPARINUNFRC  --  0.66 0.84*  --  0.32  CREATININE  --  2.74*  --  1.25* 0.96    Estimated Creatinine Clearance: 51.6 mL/min (by C-G formula based on SCr of 0.96 mg/dL).   Medical History: Past Medical History:  Diagnosis Date  . Hyperlipidemia   . Hypertension   . Thyroid disease     Medications:  Medications Prior to Admission  Medication Sig Dispense Refill Last Dose  . atorvastatin (LIPITOR) 10 MG tablet Take 10 mg by mouth every morning.    06/20/2019 at Unknown time  . Calcium Carb-Cholecalciferol 600-400 MG-UNIT TABS Take 1 tablet by mouth daily.    06/20/2019 at Unknown time  . lisinopril-hydrochlorothiazide (ZESTORETIC) 20-25 MG tablet Take 0.5 tablets by mouth daily. 45 tablet 3 06/20/2019 at Unknown time  . SYNTHROID 75 MCG tablet Take 75 mcg by mouth every morning.    06/20/2019 at Unknown time     Assessment: Pharmacy consulted to dose heparin in patient with potential PE (VQ scan pending).  Patient not on anticoagulation prior to admission.  HL 0.32  Goal of Therapy:  Heparin level 0.3-0.7 units/ml Monitor platelets by anticoagulation protocol: Yes   Plan:  Heparin restarted at midnight. Continue heparin infusion at 1050 units/hr Check anti-Xa level in 8 hours and daily while on heparin. Continue to monitor H&H and platelets.  Margot Ables, PharmD Clinical Pharmacist 06/23/2019 8:50 AM

## 2019-06-23 NOTE — Addendum Note (Signed)
Addendum  created 06/23/19 0945 by Mickel Baas, CRNA   Clinical Note Signed

## 2019-06-23 NOTE — Progress Notes (Signed)
1 Day Post-Op  Subjective: Patient denies any incisional pain.  No nausea or vomiting noted.  Tolerating clear liquid diet well.  No flatus or bowel movement yet.  Objective: Vital signs in last 24 hours: Temp:  [97.8 F (36.6 C)-98.6 F (37 C)] 98.4 F (36.9 C) (07/23 0520) Pulse Rate:  [72-89] 72 (07/23 0520) Resp:  [14-23] 17 (07/23 0520) BP: (116-151)/(57-86) 116/59 (07/23 0520) SpO2:  [88 %-100 %] 95 % (07/23 0520) Last BM Date: 06/15/19  Intake/Output from previous day: 07/22 0701 - 07/23 0700 In: 3010.3 [P.O.:680; I.V.:2330.3] Out: 1855 [Urine:350; Blood:5] Intake/Output this shift: No intake/output data recorded.  General appearance: alert, cooperative and no distress Resp: clear to auscultation bilaterally Cardio: regular rate and rhythm, S1, S2 normal, no murmur, click, rub or gallop GI: Soft, minimal bowel sounds appreciated.  Incision healing well.  Lab Results:  Recent Labs    06/21/19 0415 06/22/19 0623  WBC 9.4 5.9  HGB 13.6 13.2  HCT 41.2 41.5  PLT 170 125*   BMET Recent Labs    06/21/19 0415 06/22/19 0623  NA 143 147*  K 3.2* 3.7  CL 98 106  CO2 31 31  GLUCOSE 153* 159*  BUN 54* 45*  CREATININE 2.74* 1.25*  CALCIUM 9.4 9.0   PT/INR Recent Labs    06/21/19 0034  LABPROT 13.9  INR 1.1    Studies/Results: No results found.  Anti-infectives: Anti-infectives (From admission, onward)   Start     Dose/Rate Route Frequency Ordered Stop   06/22/19 0600  vancomycin (VANCOCIN) 500 mg in sodium chloride 0.9 % 100 mL IVPB  Status:  Discontinued     500 mg 100 mL/hr over 60 Minutes Intravenous On call to O.R. 06/21/19 1935 06/21/19 2038   06/22/19 0600  vancomycin (VANCOCIN) IVPB 1000 mg/200 mL premix     1,000 mg 200 mL/hr over 60 Minutes Intravenous On call to O.R. 06/21/19 2038 06/22/19 1228      Assessment/Plan: s/p Procedure(s): HERNIA REPAIR UMBILICAL ADULT Impression: Stable on postoperative day 1.  Advance diet as tolerated.   Will start ambulating patient.  Patient is back on heparin drip.  Further management per hospitalist.  LOS: 2 days    Aviva Signs 06/23/2019

## 2019-06-24 LAB — CBC
HCT: 42.4 % (ref 36.0–46.0)
Hemoglobin: 13.8 g/dL (ref 12.0–15.0)
MCH: 28.9 pg (ref 26.0–34.0)
MCHC: 32.5 g/dL (ref 30.0–36.0)
MCV: 88.9 fL (ref 80.0–100.0)
Platelets: 124 10*3/uL — ABNORMAL LOW (ref 150–400)
RBC: 4.77 MIL/uL (ref 3.87–5.11)
RDW: 13.4 % (ref 11.5–15.5)
WBC: 7.8 10*3/uL (ref 4.0–10.5)
nRBC: 0 % (ref 0.0–0.2)

## 2019-06-24 LAB — MAGNESIUM: Magnesium: 1.9 mg/dL (ref 1.7–2.4)

## 2019-06-24 LAB — BASIC METABOLIC PANEL
Anion gap: 10 (ref 5–15)
BUN: 27 mg/dL — ABNORMAL HIGH (ref 8–23)
CO2: 32 mmol/L (ref 22–32)
Calcium: 9.1 mg/dL (ref 8.9–10.3)
Chloride: 101 mmol/L (ref 98–111)
Creatinine, Ser: 1.06 mg/dL — ABNORMAL HIGH (ref 0.44–1.00)
GFR calc Af Amer: 58 mL/min — ABNORMAL LOW (ref >60)
GFR calc non Af Amer: 50 mL/min — ABNORMAL LOW (ref >60)
Glucose, Bld: 114 mg/dL — ABNORMAL HIGH (ref 70–99)
Potassium: 3.6 mmol/L (ref 3.5–5.1)
Sodium: 143 mmol/L (ref 135–145)

## 2019-06-24 LAB — PHOSPHORUS: Phosphorus: 3 mg/dL (ref 2.5–4.6)

## 2019-06-24 MED ORDER — BISACODYL 10 MG RE SUPP
10.0000 mg | Freq: Two times a day (BID) | RECTAL | Status: DC
  Filled 2019-06-24: qty 1

## 2019-06-24 MED ORDER — POLYETHYLENE GLYCOL 3350 17 G PO PACK
17.0000 g | PACK | Freq: Two times a day (BID) | ORAL | Status: DC
  Filled 2019-06-24: qty 1

## 2019-06-24 NOTE — Progress Notes (Signed)
2 Days Post-Op  Subjective: Patient had 3 episodes of emesis yesterday evening.  She does feel a little sick on her stomach.  She did have a small bowel movement this morning.  Objective: Vital [name] in last 24 hours: Temp:  [98.4 F (36.9 C)-98.5 F (36.9 C)] 98.5 F (36.9 C) (07/24 0511) Pulse Rate:  [75-101] 101 (07/24 0511) Resp:  [17-19] 19 (07/24 0511) BP: (123-144)/(65-73) 142/73 (07/24 0511) SpO2:  [96 %-97 %] 97 % (07/24 0511) Last BM Date: 06/15/19  Intake/Output from previous day: 07/23 0701 - 07/24 0700 In: 960 [P.O.:960] Out: 800 [Urine:800] Intake/Output this shift: No intake/output data recorded.  General appearance: alert, cooperative and no distress GI: Soft, slightly distended.  Incision healing well.  No rigidity noted.  Bowel sounds are now active.  Lab Results:  Recent Labs    06/23/19 0732 06/24/19 0735  WBC 6.8 7.8  HGB 12.1 13.8  HCT 38.3 42.4  PLT 106* 124*   BMET Recent Labs    06/23/19 0732 06/24/19 0735  NA 143 143  K 4.0 3.6  CL 110 101  CO2 26 32  GLUCOSE 119* 114*  BUN 35* 27*  CREATININE 0.96 1.06*  CALCIUM 8.1* 9.1   PT/INR No results for input(s): LABPROT, INR in the last 72 hours.  Studies/Results: Ct Angio Chest Pe W Or Wo Contrast  Result Date: 06/23/2019 CLINICAL DATA:  Hypoxia.  Positive D-dimer. EXAM: CT ANGIOGRAPHY CHEST WITH CONTRAST TECHNIQUE: Multidetector CT imaging of the chest was performed using the standard protocol during bolus administration of intravenous contrast. Multiplanar CT image reconstructions and MIPs were obtained to evaluate the vascular anatomy. CONTRAST:  100mL OMNIPAQUE IOHEXOL 350 MG/ML SOLN COMPARISON:  None. FINDINGS: Cardiovascular: No filling defects in the pulmonary arteries to suggest pulmonary emboli. Coronary artery calcifications in the left anterior descending and left circumflex coronary arteries. Descending aortic calcifications. No aneurysm or dissection. Mediastinum/Nodes: No  mediastinal, hilar, or axillary adenopathy. Trachea and esophagus are unremarkable. Thyroid unremarkable. Lungs/Pleura: Left base opacity, likely atelectasis. Small nodule in the right middle lobe measures 5 mm. No effusions. Upper Abdomen: Gallstones noted within the gallbladder. The spleen is not imaged in its entirety, but appears enlarged. Recommend clinical correlation. Left adrenal nodule measures up to 2.8 cm. Musculoskeletal: Chest wall soft tissues are unremarkable. No acute bony abnormality. Review of the MIP images confirms the above findings. IMPRESSION: No evidence of pulmonary embolus. Coronary artery disease. 5 mm right middle lobe pulmonary nodule peripherally. No follow-up needed if patient is low-risk. Non-contrast chest CT can be considered in 12 months if patient is high-risk. This recommendation follows the consensus statement: Guidelines for Management of Incidental Pulmonary Nodules Detected on CT Images: From the Fleischner Society 2017; Radiology 2017; 284:228-243. Cholelithiasis. Spleen appears enlarged but not imaged in its entirety. Recommend clinical correlation. Aortic Atherosclerosis (ICD10-I70.0). Electronically Signed   By: Charlett NoseKevin  Dover M.D.   On: 06/23/2019 10:24    Anti-infectives: Anti-infectives (From admission, onward)   Start     Dose/Rate Route Frequency Ordered Stop   06/22/19 0600  vancomycin (VANCOCIN) 500 mg in sodium chloride 0.9 % 100 mL IVPB  Status:  Discontinued     500 mg 100 mL/hr over 60 Minutes Intravenous On call to O.R. 06/21/19 1935 06/21/19 2038   06/22/19 0600  vancomycin (VANCOCIN) IVPB 1000 mg/200 mL premix     1,000 mg 200 mL/hr over 60 Minutes Intravenous On call to O.R. 06/21/19 2038 06/22/19 1228      Assessment/Plan: s/p  Procedure(s): HERNIA REPAIR UMBILICAL ADULT Impression: Nausea and vomiting secondary to mild ileus from her acute kidney injury and bowel ileus.  Both are resolving.  She is also hypophosphatemic which is being  treated. Plan: Will give cathartics.  Encouraged to ambulate in the hallway and sit in a chair.  LOS: 3 days    Hannah [name] 06/24/2019

## 2019-06-24 NOTE — Progress Notes (Signed)
Patient plans on discharging to her daughter, Marjory Lies, home [phone].  At baseline, she reports she is independent and drives. She is agreeable to HHPT. Choices given and referral made to [facility] at [facility]. Georgina Snell advised that patient is scheduled to discharge 06/25/2019.   Shameca Landen, Clydene Pugh, LCSW

## 2019-06-24 NOTE — Plan of Care (Signed)
  Problem: Acute Rehab PT Goals(only PT should resolve) Goal: Pt Will Go Supine/Side To Sit Outcome: Progressing Flowsheets (Taken 06/24/2019 1222) Pt will go Supine/Side to Sit: Independently Goal: Patient Will Transfer Sit To/From Stand Outcome: Progressing Flowsheets (Taken 06/24/2019 1222) Patient will transfer sit to/from stand: with modified independence Goal: Pt Will Transfer Bed To Chair/Chair To Bed Outcome: Progressing Flowsheets (Taken 06/24/2019 1222) Pt will Transfer Bed to Chair/Chair to Bed: with modified independence Goal: Pt Will Ambulate Outcome: Progressing Flowsheets (Taken 06/24/2019 1222) Pt will Ambulate:  > 125 feet  with modified independence  with rolling walker   12:23 PM, 06/24/19 Lonell Grandchild, MPT Physical Therapist with [facility] [phone] office [phone] mobile phone

## 2019-06-24 NOTE — Evaluation (Signed)
Physical Therapy Evaluation Patient Details Name: Hannah [name] MRN: [id number] DOB: Jan 23, 1941 Today's Date: 06/24/2019   History of Present Illness  Hannah [name] is a 78 y.o. female, s/p  HERNIA REPAIR UMBILICAL ADULT, 06/22/19 with medical history significant for hypertension, hyperlipidemia hypothyroidism who presented to the ED with complaints of abdominal pain and multiple episodes of vomiting over the past 5 days.  Patient last bowel movement was 5 days ago.  No fever no chills.She also reports a chronic intermittent dry cough that is unchanged, she denies difficulty breathing, chest pain.  She never smoked cigarettes.  Denies lower extremity swelling.  No history of blood clots in lungs or legs.  She is fairly active, lives alone, mows her own lawn.Daughter is an Nutritional therapist here at [employer].    Clinical Impression  Patient agreeable for therapy and demonstrates labored movement for sitting up at bedside, tendency to lean on nearby objects for support, required use of RW for gait training for safety, able to ambulate in room and hallways without loss of balance while on room air with SpO2 between 92-94%, once seated at bedside, SpO2 dropped to 88% after sitting in chair, but increased to 95% after education in pursed lip breathing.  Patient tolerated staying up in chair after therapy - RN notified.  Patient will benefit from continued physical therapy in hospital and recommended venue below to increase strength, balance, endurance for safe ADLs and gait.      Follow Up Recommendations Home health PT;Supervision - Intermittent;Supervision for mobility/OOB    Equipment Recommendations  None recommended by PT    Recommendations for Other Services       Precautions / Restrictions Precautions Precautions: Fall Restrictions Weight Bearing Restrictions: No      Mobility  Bed Mobility Overal bed mobility: Modified Independent             General bed mobility comments: increased  time  Transfers Overall transfer level: Needs assistance Equipment used: Rolling walker (2 wheeled) Transfers: Sit to/from [employer] Transfers Sit to Stand: Supervision;Min guard Stand pivot transfers: Supervision;Min guard       General transfer comment: increased time, slightly labored movement  Ambulation/Gait Ambulation/Gait assistance: Supervision Gait Distance (Feet): 100 Feet Assistive device: Rolling walker (2 wheeled) Gait Pattern/deviations: Decreased step length - right;Decreased step length - left;Decreased stride length Gait velocity: decreased   General Gait Details: slightly labored cadence without loss of balance, limited secondary to fatigue, on room air with SpO2 mostly between 93-94%, once returned to room and sat in chair dropped to 88%, but increased to 95% after resting  Stairs            Wheelchair Mobility    Modified Rankin (Stroke Patients Only)       Balance Overall balance assessment: Needs assistance Sitting-balance support: Feet supported;No upper extremity supported Sitting balance-Leahy Scale: Good     Standing balance support: During functional activity;No upper extremity supported Standing balance-Leahy Scale: Poor Standing balance comment: fair/poor without AD, fair/good using RW                             Pertinent Vitals/Pain Pain Assessment: No/denies pain    Home Living Family/patient expects to be discharged to:: Private residence Living Arrangements: Children(will be staying with her daughter after discharge home) Available Help at Discharge: Family;Available PRN/intermittently Type of Home: House Home Access: Stairs to enter Entrance Stairs-Rails: Left Entrance Stairs-Number of Steps: 2 Home Layout: One  level Home Equipment: Walker - 2 wheels;Cane - single point;Bedside commode;Shower seat Additional Comments: most equipment left over from her spouse and other relatives    Prior Function Level  of Independence: Independent         Comments: Tourist information centre manager, drives     Higher education careers adviser        Extremity/Trunk Assessment   Upper Extremity Assessment Upper Extremity Assessment: Generalized weakness    Lower Extremity Assessment Lower Extremity Assessment: Generalized weakness    Cervical / Trunk Assessment Cervical / Trunk Assessment: Normal  Communication   Communication: No difficulties  Cognition Arousal/Alertness: Awake/alert Behavior During Therapy: WFL for tasks assessed/performed Overall Cognitive Status: Within Functional Limits for tasks assessed                                        General Comments      Exercises     Assessment/Plan    PT Assessment Patient needs continued PT services  PT Problem List Decreased strength;Decreased activity tolerance;Decreased balance;Decreased mobility       PT Treatment Interventions Balance training;Gait training;Stair training;Functional mobility training;Therapeutic activities;Therapeutic exercise;Patient/family education    PT Goals (Current goals can be found in the Care Plan section)  Acute Rehab PT Goals Patient Stated Goal: return home with family to assist PT Goal Formulation: With patient Time For Goal Achievement: 07/01/19 Potential to Achieve Goals: Good    Frequency Min 3X/week   Barriers to discharge        Co-evaluation               AM-PAC PT "6 Clicks" Mobility  Outcome Measure Help needed turning from your back to your side while in a flat bed without using bedrails?: None Help needed moving from lying on your back to sitting on the side of a flat bed without using bedrails?: None Help needed moving to and from a bed to a chair (including a wheelchair)?: A Little Help needed standing up from a chair using your arms (e.g., wheelchair or bedside chair)?: A Little Help needed to walk in hospital room?: A Little Help needed climbing 3-5 steps with a railing?  : A Lot 6 Click Score: 19    End of Session   Activity Tolerance: Patient tolerated treatment well;Patient limited by fatigue Patient left: in chair;with call bell/phone within reach Nurse Communication: Mobility status PT Visit Diagnosis: Unsteadiness on feet (R26.81);Other abnormalities of gait and mobility (R26.89);Muscle weakness (generalized) (M62.81)    Time: 0865-7846 PT Time Calculation (min) (ACUTE ONLY): 26 min   Charges:   PT Evaluation $PT Eval Moderate Complexity: 1 Mod PT Treatments $Therapeutic Activity: 23-37 mins        12:21 PM, 06/24/19 Ocie Bob, MPT Physical Therapist with [facility] [phone] office [phone] mobile phone

## 2019-06-24 NOTE — Progress Notes (Signed)
PROGRESS NOTE    Hannah [name]  AVW:[id number] DOB: 05-27-1941 DOA: 06/20/2019 PCP: Remus Loffler, PA-C   Brief Narrative:  Per HPI: Hannah [name] is a 78 y.o. female with medical history significant for hypertension, hyperlipidemia hypothyroidism who presented to the ED with complaints of abdominal pain and multiple episodes of vomiting over the past 5 days.  Patient last bowel movement was 5 days ago.  No fever no chills. She also reports a chronic intermittent dry cough that is unchanged, she denies difficulty breathing, chest pain.  She never smoked cigarettes.  Denies lower extremity swelling.  No history of blood clots in lungs or legs.  She is fairly active, lives alone, mows her own lawn. Daughter is an Nutritional therapist here at [employer]. Patient initially presented at the urgent care and was subsequently referred to the ED.  Patient was admitted with incarcerated umbilical hernia along with AKI likely secondary to poor oral intake.  She was noted to have some hypoxemia as well and upper respiratory symptoms with chest x-ray that was noted to be negative.  VQ scan was ordered and was negative for PE, but only perfusion scan was performed.  Patient has been started on heparin drip.  Assessment & Plan:   Active Problems:   AKI (acute kidney injury) (HCC)   Incarcerated umbilical hernia   Acute kidney injury- creatinine 2.9, baseline ~0.9 (per care everywhere).  With hypotension.  Likely prerenal from vomiting, poor p.o. intake and ongoing lisinopril- HCTZ use.   -Improve after fluid resuscitation and discontinuation of nephrotoxic agents -Renal function is back to normal -Continue to follow trend. -Patient advised to maintain adequate oral hydration.  Hypoxia- possibly secondary to PE -VQ scan with only perfusion side performed demonstrating no PE currently -Wells score 4.5 indicating moderate to high risk for PE -Status post CT Angie demonstrating no acute pulmonary embolism.  -Anticoagulation discontinued. -Patient oxygen supplementation now discontinue and with good O2 sat appreciated on room air. -Chest x-ray negative suggesting no other etiology currently -2D echocardiogram: Normal systolic function with ejection fraction 60-65%.  Impair diastolic relaxation parameters; no wall motion abnormalities.  Right ventricle normal systolic function appreciated.  No significant valvular stenosis.  Incarcerated umbilical hernia- -Status post surgical repair -Will follow general surgery recommendation postoperatively. -Increase physical activity -Continue advancing diet as tolerated -PRN laxative to be added by surgery.  Hypertension-stable overall. -Continue to hold home lisinopril and HCTZ for another 24 hours. -Restart home medications once renal function completely recovered and patient tolerating full by mouth diet.  Hypothyroidism -Continue Synthroid.   DVT prophylaxis: Heparin  Code Status: Full Family Communication: None at bedside Disposition Plan: Status post hernia repair.  Surgery available well-tolerated.  Denies chest pain, no shortness of breath.  Oxygen saturation on room air above 90%.  Patient feeling weak and deconditioned; assessed by physical therapy who recommended home health services at discharge.  Positive nausea and vomiting overnight.  Will increase physical activity and add some laxatives.  Hopefully discharge in the next 24 hours.  Consultants:   General surgery  Procedures:   None  Antimicrobials:   None   Subjective: No fever, no chest pain, no abdominal pain.  Patient with episode of nausea/vomiting overnight.  Feeling deconditioned.  Objective: Vitals:   06/23/19 1341 06/23/19 2008 06/23/19 2200 06/24/19 0511  BP: (!) 144/71  133/65 (!) 142/73  Pulse: 98  95 (!) 101  Resp: 17  17 19   Temp: 98.4 F (36.9 C)  98.5 F (36.9 C)  98.5 F (36.9 C)  TempSrc: Oral  Oral Oral  SpO2: 97% 97% 96% 97%  Weight:       Height:        Intake/Output Summary (Last 24 hours) at 06/24/2019 1547 Last data filed at 06/24/2019 0900 Gross per 24 hour  Intake 720 ml  Output 800 ml  Net -80 ml   Filed Weights   06/20/19 1424 06/20/19 2246  Weight: 78.9 kg 80.4 kg    Examination: General exam: Alert, awake, oriented x 3; denying abdominal pain no chest pain.  Positive nausea/vomiting overnight.  Feeling slightly deconditioned. Respiratory system: Positive for scattered rhonchi; good oxygen saturation on room air currently, normal respiratory effort.  No wheezing  Cardiovascular system:RRR. No murmurs, rubs, gallops. Gastrointestinal system: Abdomen is nondistended, soft and nontender. No organomegaly or masses felt. Normal bowel sounds heard. Central nervous system: Alert and oriented. No focal neurological deficits. Extremities: No C/C/E, +pedal pulses Skin: No rashes, lesions or ulcers Psychiatry: Judgement and insight appear normal. Mood & affect appropriate.   Data Reviewed: I have personally reviewed following labs and imaging studies  CBC: Recent Labs  Lab 06/20/19 1516 06/21/19 0415 06/22/19 0623 06/23/19 0732 06/24/19 0735  WBC 11.1* 9.4 5.9 6.8 7.8  HGB 15.8* 13.6 13.2 12.1 13.8  HCT 47.8* 41.2 41.5 38.3 42.4  MCV 87.7 88.0 91.0 91.4 88.9  PLT 254 170 125* 106* 124*   Basic Metabolic Panel: Recent Labs  Lab 06/20/19 1516 06/21/19 0415 06/22/19 0623 06/23/19 0732 06/24/19 0735  NA 141 143 147* 143 143  K 3.5 3.2* 3.7 4.0 3.6  CL 96* 98 106 110 101  CO2 30 31 31 26  32  GLUCOSE 168* 153* 159* 119* 114*  BUN 47* 54* 45* 35* 27*  CREATININE 2.90* 2.74* 1.25* 0.96 1.06*  CALCIUM 10.7* 9.4 9.0 8.1* 9.1  MG  --   --  2.0 1.9 1.9  PHOS  --   --   --  1.5* 3.0   GFR: Estimated Creatinine Clearance: 46.7 mL/min (A) (by C-G formula based on SCr of 1.06 mg/dL (H)).   Liver Function Tests: Recent Labs  Lab 06/20/19 1516  AST 26  ALT 18  ALKPHOS 79  BILITOT 1.5*  PROT 8.7*   ALBUMIN 5.0   Recent Labs  Lab 06/20/19 1516  LIPASE 30   Coagulation Profile: Recent Labs  Lab 06/21/19 0034  INR 1.1    Recent Results (from the past 240 hour(s))  SARS Coronavirus 2 (CEPHEID - Performed in [facility] hospital lab), Hosp Order     Status: None   Collection Time: 06/20/19  5:28 PM   Specimen: Nasopharyngeal Swab  Result Value Ref Range Status   SARS Coronavirus 2 NEGATIVE NEGATIVE Final    Comment: (NOTE) If result is NEGATIVE SARS-CoV-2 target nucleic acids are NOT DETECTED. The SARS-CoV-2 RNA is generally detectable in upper and lower  respiratory specimens during the acute phase of infection. The lowest  concentration of SARS-CoV-2 viral copies this assay can detect is 250  copies / mL. A negative result does not preclude SARS-CoV-2 infection  and should not be used as the sole basis for treatment or other  patient management decisions.  A negative result may occur with  improper specimen collection / handling, submission of specimen other  than nasopharyngeal swab, presence of viral mutation(s) within the  areas targeted by this assay, and inadequate number of viral copies  (<250 copies / mL). A negative result must be combined with  clinical  observations, patient history, and epidemiological information. If result is POSITIVE SARS-CoV-2 target nucleic acids are DETECTED. The SARS-CoV-2 RNA is generally detectable in upper and lower  respiratory specimens dur ing the acute phase of infection.  Positive  results are indicative of active infection with SARS-CoV-2.  Clinical  correlation with patient history and other diagnostic information is  necessary to determine patient infection status.  Positive results do  not rule out bacterial infection or co-infection with other viruses. If result is PRESUMPTIVE POSTIVE SARS-CoV-2 nucleic acids MAY BE PRESENT.   A presumptive positive result was obtained on the submitted specimen  and confirmed on repeat  testing.  While 2019 novel coronavirus  (SARS-CoV-2) nucleic acids may be present in the submitted sample  additional confirmatory testing may be necessary for epidemiological  and / or clinical management purposes  to differentiate between  SARS-CoV-2 and other Sarbecovirus currently known to infect humans.  If clinically indicated additional testing with an alternate test  methodology [phone]) is advised. The SARS-CoV-2 RNA is generally  detectable in upper and lower respiratory sp ecimens during the acute  phase of infection. The expected result is Negative. Fact Sheet for Patients:  BoilerBrush.com.cy Fact Sheet for Healthcare Providers: https://pope.com/ This test is not yet approved or cleared by the [place] FDA and has been authorized for detection and/or diagnosis of SARS-CoV-2 by FDA under an Emergency Use Authorization (EUA).  This EUA will remain in effect (meaning this test can be used) for the duration of the COVID-19 declaration under Section 564(b)(1) of the Act, 21 U.S.C. section 360bbb-3(b)(1), unless the authorization is terminated or revoked sooner. Performed at [facility], [address] [place], [place] [address]   Surgical pcr screen     Status: Abnormal   Collection Time: 06/21/19  8:09 PM   Specimen: Nasal Mucosa; Nasal Swab  Result Value Ref Range Status   MRSA, PCR NEGATIVE NEGATIVE Final   Staphylococcus aureus POSITIVE (A) NEGATIVE Final    Comment: (NOTE) The Xpert SA Assay (FDA approved for NASAL specimens in patients 75 years of age and older), is one component of a comprehensive surveillance program. It is not intended to diagnose infection nor to guide or monitor treatment. Performed at [facility], [address] [place], [place] [address]      Radiology Studies: Ct Angio Chest Pe W Or Wo Contrast  Result Date: 06/23/2019 CLINICAL DATA:  Hypoxia.  Positive D-dimer. EXAM: CT ANGIOGRAPHY  CHEST WITH CONTRAST TECHNIQUE: Multidetector CT imaging of the chest was performed using the standard protocol during bolus administration of intravenous contrast. Multiplanar CT image reconstructions and MIPs were obtained to evaluate the vascular anatomy. CONTRAST:  OMNIPAQUE IOHEXOL 350 MG/ML SOLN COMPARISON:  None. FINDINGS: Cardiovascular: No filling defects in the pulmonary arteries to suggest pulmonary emboli. Coronary artery calcifications in the left anterior descending and left circumflex coronary arteries. Descending aortic calcifications. No aneurysm or dissection. Mediastinum/Nodes: No mediastinal, hilar, or axillary adenopathy. Trachea and esophagus are unremarkable. Thyroid unremarkable. Lungs/Pleura: Left base opacity, likely atelectasis. Small nodule in the right middle lobe measures 5 mm. No effusions. Upper Abdomen: Gallstones noted within the gallbladder. The spleen is not imaged in its entirety, but appears enlarged. Recommend clinical correlation. Left adrenal nodule measures up to 2.8 cm. Musculoskeletal: Chest wall soft tissues are unremarkable. No acute bony abnormality. Review of the MIP images confirms the above findings. IMPRESSION: No evidence of pulmonary embolus. Coronary artery disease. 5 mm right middle lobe pulmonary nodule peripherally.  No follow-up needed if patient is low-risk. Non-contrast chest CT can be considered in 12 months if patient is high-risk. This recommendation follows the consensus statement: Guidelines for Management of Incidental Pulmonary Nodules Detected on CT Images: From the Fleischner Society 2017; Radiology 2017; 284:228-243. Cholelithiasis. Spleen appears enlarged but not imaged in its entirety. Recommend clinical correlation. Aortic Atherosclerosis (ICD10-I70.0). Electronically Signed   By: Charlett Nose M.D.   On: 06/23/2019 10:24    Scheduled Meds: . bisacodyl  10 mg Rectal BID  . Chlorhexidine Gluconate Cloth  6 each Topical Daily  . fentaNYL  (SUBLIMAZE) injection  50 mcg Intravenous Once  . heparin injection (subcutaneous)  5,000 Units Subcutaneous Q8H  . levothyroxine  75 mcg Oral Q0600  . mupirocin ointment  1 application Nasal BID  . polyethylene glycol  17 g Oral BID  . sodium chloride flush  3 mL Intravenous Once   Continuous Infusions:    LOS: 3 days    Time spent: 30 minutes    Vassie Loll, MD [facility] Pager [phone]  06/24/2019, 3:47 PM

## 2019-06-25 LAB — CBC
HCT: 40.4 % (ref 36.0–46.0)
Hemoglobin: 13.1 g/dL (ref 12.0–15.0)
MCH: 28.8 pg (ref 26.0–34.0)
MCHC: 32.4 g/dL (ref 30.0–36.0)
MCV: 88.8 fL (ref 80.0–100.0)
Platelets: 121 10*3/uL — ABNORMAL LOW (ref 150–400)
RBC: 4.55 MIL/uL (ref 3.87–5.11)
RDW: 13.7 % (ref 11.5–15.5)
WBC: 6.6 10*3/uL (ref 4.0–10.5)
nRBC: 0 % (ref 0.0–0.2)

## 2019-06-25 MED ORDER — POLYETHYLENE GLYCOL 3350 17 G PO PACK: 17.0000 g | PACK | Freq: Every day | ORAL | 0 refills | Status: DC | PRN

## 2019-06-25 MED ORDER — ONDANSETRON HCL 4 MG PO TABS: 4.0000 mg | ORAL_TABLET | Freq: Four times a day (QID) | ORAL | 0 refills | Status: DC | PRN

## 2019-06-25 MED ORDER — TRAMADOL HCL 50 MG PO TABS: 50.0000 mg | ORAL_TABLET | Freq: Four times a day (QID) | ORAL | 0 refills | Status: DC | PRN

## 2019-06-25 NOTE — Discharge Summary (Signed)
Physician Discharge Summary  Pavandeep Lawler JSE:[id number] DOB: 1940-12-26 DOA: 06/20/2019  PCP: Remus Loffler, PA-C  Admit date: 06/20/2019 Discharge date: 06/25/2019  Admitted From: Home Disposition: Home  Recommendations for Outpatient Follow-up:  1. Follow up with PCP in 1-2 weeks 2. Please obtain BMP/CBC in one week 3. Patient will follow-up with general surgery  Home Health: Home health PT Equipment/Devices:  Discharge Condition: Stable CODE STATUS: Full code Diet recommendation: Heart healthy  Brief/Interim Summary: 78 year old female with a history of hypertension, hyperlipidemia, hypothyroidism, admitted to the hospital with abdominal pain and vomiting. She was found to have incarcerated umbilical hernia along with acute kidney injury and was admitted for further treatments.  She was also noted to have some hypoxia as well as upper respiratory symptoms.  Discharge Diagnoses:  Active Problems:   Hyperlipidemia   Acquired hypothyroidism   Essential hypertension   AKI (acute kidney injury) (HCC)   Incarcerated umbilical hernia  1. Acute kidney injury.  Creatinine on admission was 2.9.  This is felt to be related to hypotension.  She was treated with intravenous fluids and ACE inhibitor/HCTZ was held.  Renal function has since improved back to normal.  Continue to hold ACE/HCTZ until follow-up with primary care physician. 2. Incarcerated umbilical hernia.  Status post surgical repair.  She is now moving her bowels and tolerating a solid diet.  Pain appears to be controlled.  She will follow-up with general surgery as an outpatient. 3. Hypoxia.  Initially, there was concerns for pulmonary embolus.  VQ scan and subsequent CT angiogram were found to be negative for PE.  She was initially started empiric anticoagulation, but when imaging studies were negative this was discontinued.  Echocardiogram showed normal ejection fraction.  She is now breathing comfortably on room  air. 4. Hypertension.  Overall currently stable.  Lisinopril/hydrochlorothiazide currently on hold.  We will continue to hold until she follows up with her primary care physician. 5. Hypothyroidism.  Continue on Synthroid  Discharge Instructions  Discharge Instructions    Diet - low sodium heart healthy   Complete by: As directed    Increase activity slowly   Complete by: As directed      Allergies as of 06/25/2019      Reactions   Penicillins Rash   Did it involve swelling of the face/tongue/throat, SOB, or low BP? Unknown Did it involve sudden or severe rash/hives, skin peeling, or any reaction on the inside of your mouth or nose? Ruta Hinds Did you need to seek medical attention at a hospital or doctor's office? No When did it last happen?childhood If all above answers are "NO", may proceed with cephalosporin use.   Sulfa Antibiotics Rash      Medication List    STOP taking these medications   lisinopril-hydrochlorothiazide 20-25 MG tablet Commonly known as: ZESTORETIC     TAKE these medications   atorvastatin 10 MG tablet Commonly known as: LIPITOR Take 10 mg by mouth every morning.   Calcium Carb-Cholecalciferol 600-400 MG-UNIT Tabs Take 1 tablet by mouth daily.   ondansetron 4 MG tablet Commonly known as: ZOFRAN Take 1 tablet (4 mg total) by mouth every 6 (six) hours as needed for nausea.   polyethylene glycol 17 g packet Commonly known as: MIRALAX / GLYCOLAX Take 17 g by mouth daily as needed for mild constipation.   Synthroid 75 MCG tablet Generic drug: levothyroxine Take 75 mcg by mouth every morning.   traMADol 50 MG tablet Commonly known as: ULTRAM Take 1 tablet (  50 mg total) by mouth every 6 (six) hours as needed (mild pain).            Durable Medical Equipment  (From admission, onward)         Start     Ordered   06/23/19 0924  For home use only DME oxygen  Once    Question Answer Comment  Length of Need 6 Months   Oxygen delivery  system Gas      06/23/19 1610         Follow-up Information    Franky Macho, MD Follow up.   Specialty: General Surgery Why: office will contact you for a virtual visit Contact information: [address] [place] [address] [phone]        Remus Loffler, PA-C. Schedule an appointment as soon as possible for a visit in 1 week(s).   Specialties: Physician Assistant, Family Medicine Contact information: [address] [place] [address] [phone]          Allergies  Allergen Reactions  . Penicillins Rash    Did it involve swelling of the face/tongue/throat, SOB, or low BP? Unknown Did it involve sudden or severe rash/hives, skin peeling, or any reaction on the inside of your mouth or nose? Ruta Hinds Did you need to seek medical attention at a hospital or doctor's office? No When did it last happen?childhood If all above answers are "NO", may proceed with cephalosporin use.   . Sulfa Antibiotics Rash    Consultations:  General surgery   Procedures/Studies: Ct Abdomen Pelvis Wo Contrast  Result Date: 06/20/2019 CLINICAL DATA:  Abdominal pain for several days EXAM: CT ABDOMEN AND PELVIS WITHOUT CONTRAST TECHNIQUE: Multidetector CT imaging of the abdomen and pelvis was performed following the standard protocol without IV contrast. COMPARISON:  None. FINDINGS: Lower chest: No acute abnormality. Hepatobiliary: Liver is well visualized within normal limits. The gallbladder is well distended with 2 heavily calcified gallstones. No wall thickening or pericholecystic fluid is noted. Pancreas: Unremarkable. No pancreatic ductal dilatation or surrounding inflammatory changes. Spleen: Normal in size without focal abnormality. Adrenals/Urinary Tract: Adrenal glands are within normal limits. Kidneys are well visualized bilaterally. No renal calculi or obstructive changes are seen. The bladder is well distended. Stomach/Bowel: Diverticular change of the colon  is identified without inflammatory change. The colon is predominately decompressed however. The appendix has been surgically removed. There is an incarcerated loop of small bowel identified within an umbilical hernia with proximal small bowel dilatation extending from the stomach to the level of the hernia. The small bowel distal to the hernia is completely decompressed comprising almost the entire ileum. Vascular/Lymphatic: Aortic atherosclerosis. No enlarged abdominal or pelvic lymph nodes. Reproductive: Status post hysterectomy. No adnexal masses. Other: No abdominal wall hernia or abnormality. No abdominopelvic ascites. Musculoskeletal: No acute or significant osseous findings. IMPRESSION: Incarcerated umbilical hernia containing a loop of small bowel likely near the junction of the jejunum and ileum with proximal small bowel obstruction. Cholelithiasis without complicating factors. Diverticulosis without diverticulitis. Electronically Signed   By: Alcide Clever M.D.   On: 06/20/2019 18:17   Ct Angio Chest Pe W Or Wo Contrast  Result Date: 06/23/2019 CLINICAL DATA:  Hypoxia.  Positive D-dimer. EXAM: CT ANGIOGRAPHY CHEST WITH CONTRAST TECHNIQUE: Multidetector CT imaging of the chest was performed using the standard protocol during bolus administration of intravenous contrast. Multiplanar CT image reconstructions and MIPs were obtained to evaluate the vascular anatomy. CONTRAST:  OMNIPAQUE IOHEXOL 350 MG/ML SOLN COMPARISON:  None. FINDINGS: Cardiovascular: No filling defects in the pulmonary arteries to suggest pulmonary emboli. Coronary artery calcifications in the left anterior descending and left circumflex coronary arteries. Descending aortic calcifications. No aneurysm or dissection. Mediastinum/Nodes: No mediastinal, hilar, or axillary adenopathy. Trachea and esophagus are unremarkable. Thyroid unremarkable. Lungs/Pleura: Left base opacity, likely atelectasis. Small nodule in the right middle lobe  measures 5 mm. No effusions. Upper Abdomen: Gallstones noted within the gallbladder. The spleen is not imaged in its entirety, but appears enlarged. Recommend clinical correlation. Left adrenal nodule measures up to 2.8 cm. Musculoskeletal: Chest wall soft tissues are unremarkable. No acute bony abnormality. Review of the MIP images confirms the above findings. IMPRESSION: No evidence of pulmonary embolus. Coronary artery disease. 5 mm right middle lobe pulmonary nodule peripherally. No follow-up needed if patient is low-risk. Non-contrast chest CT can be considered in 12 months if patient is high-risk. This recommendation follows the consensus statement: Guidelines for Management of Incidental Pulmonary Nodules Detected on CT Images: From the Fleischner Society 2017; Radiology 2017; 284:228-243. Cholelithiasis. Spleen appears enlarged but not imaged in its entirety. Recommend clinical correlation. Aortic Atherosclerosis (ICD10-I70.0). Electronically Signed   By: Charlett Nose M.D.   On: 06/23/2019 10:24   Nm Pulmonary Perfusion  Result Date: 06/21/2019 CLINICAL DATA:  78 year old female with left-sided chest pain and nausea. Hypoxia. EXAM: NUCLEAR MEDICINE PERFUSION LUNG SCAN TECHNIQUE: Perfusion images were obtained in multiple projections after intravenous injection of radiopharmaceutical. Ventilation scans intentionally deferred if perfusion scan and chest x-ray adequate for interpretation during COVID 19 epidemic. RADIOPHARMACEUTICALS:  1.6 mCi Tc-69m MAA IV COMPARISON:  Chest radiograph dated 06/20/2019 FINDINGS: Perfusion study demonstrates a homogeneous and uniform uptake within the lungs. No large or segmental perfusion defect identified. The ventilation scan was not performed. IMPRESSION: No perfusion defect to suggest pulmonary embolism. Electronically Signed   By: Elgie Collard M.D.   On: 06/21/2019 00:07   Dg Chest Port 1 View  Result Date: 06/20/2019 CLINICAL DATA:  Abdominal pain.  Low O2  saturation. EXAM: PORTABLE CHEST 1 VIEW COMPARISON:  None. FINDINGS: The heart size and mediastinal contours are within normal limits. Both lungs are clear. The visualized skeletal structures are unremarkable. IMPRESSION: Normal exam. Electronically Signed   By: Francene Boyers M.D.   On: 06/20/2019 17:48      Subjective: Feeling better.  No abdominal pain.  No vomiting, tolerating solid diet.  She is having bowel movements.  Discharge Exam: Vitals:   06/24/19 0511 06/24/19 1943 06/24/19 2134 06/25/19 0524  BP: (!) 142/73  132/65 (!) 121/48  Pulse: (!) 101  92 81  Resp: 19  18 17   Temp: 98.5 F (36.9 C)  98.3 F (36.8 C) 98.3 F (36.8 C)  TempSrc: Oral  Oral   SpO2: 97% 97% 95% 94%  Weight:      Height:        General: Pt is alert, awake, not in acute distress Cardiovascular: RRR, S1/S2 +, no rubs, no gallops Respiratory: CTA bilaterally, no wheezing, no rhonchi Abdominal: Soft, NT, ND, bowel sounds + Extremities: no edema, no cyanosis    The results of significant diagnostics from this hospitalization (including imaging, microbiology, ancillary and laboratory) are listed below for reference.     Microbiology: Recent Results (from the past 240 hour(s))  SARS Coronavirus 2 (CEPHEID - Performed in [facility] hospital lab), Hosp Order     Status: None   Collection Time: 06/20/19  5:28 PM   Specimen: Nasopharyngeal Swab  Result Value Ref  Range Status   SARS Coronavirus 2 NEGATIVE NEGATIVE Final    Comment: (NOTE) If result is NEGATIVE SARS-CoV-2 target nucleic acids are NOT DETECTED. The SARS-CoV-2 RNA is generally detectable in upper and lower  respiratory specimens during the acute phase of infection. The lowest  concentration of SARS-CoV-2 viral copies this assay can detect is 250  copies / mL. A negative result does not preclude SARS-CoV-2 infection  and should not be used as the sole basis for treatment or other  patient management decisions.  A negative result may  occur with  improper specimen collection / handling, submission of specimen other  than nasopharyngeal swab, presence of viral mutation(s) within the  areas targeted by this assay, and inadequate number of viral copies  (<250 copies / mL). A negative result must be combined with clinical  observations, patient history, and epidemiological information. If result is POSITIVE SARS-CoV-2 target nucleic acids are DETECTED. The SARS-CoV-2 RNA is generally detectable in upper and lower  respiratory specimens dur ing the acute phase of infection.  Positive  results are indicative of active infection with SARS-CoV-2.  Clinical  correlation with patient history and other diagnostic information is  necessary to determine patient infection status.  Positive results do  not rule out bacterial infection or co-infection with other viruses. If result is PRESUMPTIVE POSTIVE SARS-CoV-2 nucleic acids MAY BE PRESENT.   A presumptive positive result was obtained on the submitted specimen  and confirmed on repeat testing.  While 2019 novel coronavirus  (SARS-CoV-2) nucleic acids may be present in the submitted sample  additional confirmatory testing may be necessary for epidemiological  and / or clinical management purposes  to differentiate between  SARS-CoV-2 and other Sarbecovirus currently known to infect humans.  If clinically indicated additional testing with an alternate test  methodology [phone]) is advised. The SARS-CoV-2 RNA is generally  detectable in upper and lower respiratory sp ecimens during the acute  phase of infection. The expected result is Negative. Fact Sheet for Patients:  BoilerBrush.com.cy Fact Sheet for Healthcare Providers: https://pope.com/ This test is not yet approved or cleared by the [place] FDA and has been authorized for detection and/or diagnosis of SARS-CoV-2 by FDA under an Emergency Use Authorization (EUA).  This  EUA will remain in effect (meaning this test can be used) for the duration of the COVID-19 declaration under Section 564(b)(1) of the Act, 21 U.S.C. section 360bbb-3(b)(1), unless the authorization is terminated or revoked sooner. Performed at [facility], [address] [place], [place] [address]   Surgical pcr screen     Status: Abnormal   Collection Time: 06/21/19  8:09 PM   Specimen: Nasal Mucosa; Nasal Swab  Result Value Ref Range Status   MRSA, PCR NEGATIVE NEGATIVE Final   Staphylococcus aureus POSITIVE (A) NEGATIVE Final    Comment: (NOTE) The Xpert SA Assay (FDA approved for NASAL specimens in patients 60 years of age and older), is one component of a comprehensive surveillance program. It is not intended to diagnose infection nor to guide or monitor treatment. Performed at [facility], [address] [place], [place] [address]      Labs: BNP (last 3 results) No results for input(s): BNP in the last 8760 hours. Basic Metabolic Panel: Recent Labs  Lab 06/20/19 1516 06/21/19 0415 06/22/19 0623 06/23/19 0732 06/24/19 0735  NA 141 143 147* 143 143  K 3.5 3.2* 3.7 4.0 3.6  CL 96* 98 106 110 101  CO2 30 31 31 26  32  GLUCOSE 168* 153* 159* 119* 114*  BUN 47* 54* 45* 35* 27*  CREATININE 2.90* 2.74* 1.25* 0.96 1.06*  CALCIUM 10.7* 9.4 9.0 8.1* 9.1  MG  --   --  2.0 1.9 1.9  PHOS  --   --   --  1.5* 3.0   Liver Function Tests: Recent Labs  Lab 06/20/19 1516  AST 26  ALT 18  ALKPHOS 79  BILITOT 1.5*  PROT 8.7*  ALBUMIN 5.0   Recent Labs  Lab 06/20/19 1516  LIPASE 30   No results for input(s): AMMONIA in the last 168 hours. CBC: Recent Labs  Lab 06/21/19 0415 06/22/19 0623 06/23/19 0732 06/24/19 0735 06/25/19 0712  WBC 9.4 5.9 6.8 7.8 6.6  HGB 13.6 13.2 12.1 13.8 13.1  HCT 41.2 41.5 38.3 42.4 40.4  MCV 88.0 91.0 91.4 88.9 88.8  PLT 170 125* 106* 124* 121*   Cardiac Enzymes: No results for input(s): CKTOTAL, CKMB, CKMBINDEX, TROPONINI in the  last 168 hours. BNP: Invalid input(s): POCBNP CBG: No results for input(s): GLUCAP in the last 168 hours. D-Dimer No results for input(s): DDIMER in the last 72 hours. Hgb A1c No results for input(s): HGBA1C in the last 72 hours. Lipid Profile No results for input(s): CHOL, HDL, LDLCALC, TRIG, CHOLHDL, LDLDIRECT in the last 72 hours. Thyroid function studies No results for input(s): TSH, T4TOTAL, T3FREE, THYROIDAB in the last 72 hours.  Invalid input(s): FREET3 Anemia work up No results for input(s): VITAMINB12, FOLATE, FERRITIN, TIBC, IRON, RETICCTPCT in the last 72 hours. Urinalysis No results found for: COLORURINE, APPEARANCEUR, LABSPEC, PHURINE, GLUCOSEU, HGBUR, BILIRUBINUR, KETONESUR, PROTEINUR, UROBILINOGEN, NITRITE, LEUKOCYTESUR Sepsis Labs Invalid input(s): PROCALCITONIN,  WBC,  LACTICIDVEN Microbiology Recent Results (from the past 240 hour(s))  SARS Coronavirus 2 (CEPHEID - Performed in [facility] hospital lab), Hosp Order     Status: None   Collection Time: 06/20/19  5:28 PM   Specimen: Nasopharyngeal Swab  Result Value Ref Range Status   SARS Coronavirus 2 NEGATIVE NEGATIVE Final    Comment: (NOTE) If result is NEGATIVE SARS-CoV-2 target nucleic acids are NOT DETECTED. The SARS-CoV-2 RNA is generally detectable in upper and lower  respiratory specimens during the acute phase of infection. The lowest  concentration of SARS-CoV-2 viral copies this assay can detect is 250  copies / mL. A negative result does not preclude SARS-CoV-2 infection  and should not be used as the sole basis for treatment or other  patient management decisions.  A negative result may occur with  improper specimen collection / handling, submission of specimen other  than nasopharyngeal swab, presence of viral mutation(s) within the  areas targeted by this assay, and inadequate number of viral copies  (<250 copies / mL). A negative result must be combined with clinical  observations, patient  history, and epidemiological information. If result is POSITIVE SARS-CoV-2 target nucleic acids are DETECTED. The SARS-CoV-2 RNA is generally detectable in upper and lower  respiratory specimens dur ing the acute phase of infection.  Positive  results are indicative of active infection with SARS-CoV-2.  Clinical  correlation with patient history and other diagnostic information is  necessary to determine patient infection status.  Positive results do  not rule out bacterial infection or co-infection with other viruses. If result is PRESUMPTIVE POSTIVE SARS-CoV-2 nucleic acids MAY BE PRESENT.   A presumptive positive result was obtained on the submitted specimen  and confirmed on repeat testing.  While 2019 novel coronavirus  (SARS-CoV-2) nucleic acids may be present  in the submitted sample  additional confirmatory testing may be necessary for epidemiological  and / or clinical management purposes  to differentiate between  SARS-CoV-2 and other Sarbecovirus currently known to infect humans.  If clinically indicated additional testing with an alternate test  methodology [phone]) is advised. The SARS-CoV-2 RNA is generally  detectable in upper and lower respiratory sp ecimens during the acute  phase of infection. The expected result is Negative. Fact Sheet for Patients:  BoilerBrush.com.cy Fact Sheet for Healthcare Providers: https://pope.com/ This test is not yet approved or cleared by the [place] FDA and has been authorized for detection and/or diagnosis of SARS-CoV-2 by FDA under an Emergency Use Authorization (EUA).  This EUA will remain in effect (meaning this test can be used) for the duration of the COVID-19 declaration under Section 564(b)(1) of the Act, 21 U.S.C. section 360bbb-3(b)(1), unless the authorization is terminated or revoked sooner. Performed at [facility], [address], [place], [place] [address]   Surgical  pcr screen     Status: Abnormal   Collection Time: 06/21/19  8:09 PM   Specimen: Nasal Mucosa; Nasal Swab  Result Value Ref Range Status   MRSA, PCR NEGATIVE NEGATIVE Final   Staphylococcus aureus POSITIVE (A) NEGATIVE Final    Comment: (NOTE) The Xpert SA Assay (FDA approved for NASAL specimens in patients 23 years of age and older), is one component of a comprehensive surveillance program. It is not intended to diagnose infection nor to guide or monitor treatment. Performed at [facility], [address], [place], [place] [address]      Time coordinating discharge:  SIGNED:   Erick Blinks, MD  [facility] 06/25/2019, 11:58 AM   If 7PM-7AM, please contact night-coverage [email or web address]

## 2019-06-25 NOTE — Progress Notes (Signed)
3 Days Post-Op  Subjective: Patient had a regular breakfast this morning.  States she has had multiple bowel movements overnight.  She feels much better.  Objective: Vital signs in last 24 hours: Temp:  [98.3 F (36.8 C)] 98.3 F (36.8 C) (07/25 0524) Pulse Rate:  [81-92] 81 (07/25 0524) Resp:  [17-18] 17 (07/25 0524) BP: (121-132)/(48-65) 121/48 (07/25 0524) SpO2:  [94 %-97 %] 94 % (07/25 0524) Last BM Date: 06/24/19  Intake/Output from previous day: 07/24 0701 - 07/25 0700 In: 480 [P.O.:480] Out: 300 [Urine:300] Intake/Output this shift: No intake/output data recorded.  General appearance: alert, cooperative and no distress GI: soft, non-tender; bowel sounds normal; no masses,  no organomegaly and incision healing well.  Lab Results:  Recent Labs    06/24/19 0735 06/25/19 0712  WBC 7.8 6.6  HGB 13.8 13.1  HCT 42.4 40.4  PLT 124* 121*   BMET Recent Labs    06/23/19 0732 06/24/19 0735  NA 143 143  K 4.0 3.6  CL 110 101  CO2 26 32  GLUCOSE 119* 114*  BUN 35* 27*  CREATININE 0.96 1.06*  CALCIUM 8.1* 9.1   PT/INR No results for input(s): LABPROT, INR in the last 72 hours.  Studies/Results: Ct Angio Chest Pe W Or Wo Contrast  Result Date: 06/23/2019 CLINICAL DATA:  Hypoxia.  Positive D-dimer. EXAM: CT ANGIOGRAPHY CHEST WITH CONTRAST TECHNIQUE: Multidetector CT imaging of the chest was performed using the standard protocol during bolus administration of intravenous contrast. Multiplanar CT image reconstructions and MIPs were obtained to evaluate the vascular anatomy. CONTRAST:  118mL OMNIPAQUE IOHEXOL 350 MG/ML SOLN COMPARISON:  None. FINDINGS: Cardiovascular: No filling defects in the pulmonary arteries to suggest pulmonary emboli. Coronary artery calcifications in the left anterior descending and left circumflex coronary arteries. Descending aortic calcifications. No aneurysm or dissection. Mediastinum/Nodes: No mediastinal, hilar, or axillary adenopathy. Trachea  and esophagus are unremarkable. Thyroid unremarkable. Lungs/Pleura: Left base opacity, likely atelectasis. Small nodule in the right middle lobe measures 5 mm. No effusions. Upper Abdomen: Gallstones noted within the gallbladder. The spleen is not imaged in its entirety, but appears enlarged. Recommend clinical correlation. Left adrenal nodule measures up to 2.8 cm. Musculoskeletal: Chest wall soft tissues are unremarkable. No acute bony abnormality. Review of the MIP images confirms the above findings. IMPRESSION: No evidence of pulmonary embolus. Coronary artery disease. 5 mm right middle lobe pulmonary nodule peripherally. No follow-up needed if patient is low-risk. Non-contrast chest CT can be considered in 12 months if patient is high-risk. This recommendation follows the consensus statement: Guidelines for Management of Incidental Pulmonary Nodules Detected on CT Images: From the Fleischner Society 2017; Radiology 2017; 284:228-243. Cholelithiasis. Spleen appears enlarged but not imaged in its entirety. Recommend clinical correlation. Aortic Atherosclerosis (ICD10-I70.0). Electronically Signed   By: Rolm Baptise M.D.   On: 06/23/2019 10:24    Anti-infectives: Anti-infectives (From admission, onward)   Start     Dose/Rate Route Frequency Ordered Stop   06/22/19 0600  vancomycin (VANCOCIN) 500 mg in sodium chloride 0.9 % 100 mL IVPB  Status:  Discontinued     500 mg 100 mL/hr over 60 Minutes Intravenous On call to O.R. 06/21/19 1935 06/21/19 2038   06/22/19 0600  vancomycin (VANCOCIN) IVPB 1000 mg/200 mL premix     1,000 mg 200 mL/hr over 60 Minutes Intravenous On call to O.R. 06/21/19 2038 06/22/19 1228      Assessment/Plan: s/p Procedure(s): HERNIA REPAIR UMBILICAL ADULT Impression: Stable on postoperative day 3.  Bowel  function has returned.  Patient feels much better.  Okay for discharge from surgery standpoint.  LOS: 4 days    Franky MachoMark Daylin Gruszka 06/25/2019

## 2019-06-25 NOTE — TOC Transition Note (Addendum)
Transition of Care [facility]) - CM/SW Discharge Note   Patient Details  Name: Hannah [name] MRN: [id number] Date of Birth: 1941-09-22  Transition of Care [facility]) CM/SW Contact:  Latanya Maudlin, RN Phone Number: 06/25/2019, 12:55 PM   Clinical Narrative: Patient to be discharged per MD order. Orders in place for home health services. Previous TOC member gave referral to [facility]. Notified Cory of discharge. DME orders for O2 but it appears patient is now on room air.        Final next level of care: Home w Home Health Services Barriers to Discharge: No Barriers Identified   Patient Goals and CMS Choice        Discharge Placement                       Discharge Plan and Services                          HH Arranged: RN, PT [facility] Agency: [place] Date [facility] Agency Contacted: 06/25/19 Time [place]: 2330 Representative spoke with at [place]: cory  Social Determinants of Health ([place]) Interventions     Readmission Risk Interventions No flowsheet data found.

## 2019-06-28 ENCOUNTER — Telehealth: Payer: Self-pay | Admitting: Physician Assistant

## 2019-06-28 NOTE — Telephone Encounter (Signed)
Appointment given for Aug 3rd at 3:55pm

## 2019-07-01 ENCOUNTER — Other Ambulatory Visit: Payer: Self-pay

## 2019-07-04 ENCOUNTER — Other Ambulatory Visit: Payer: Self-pay

## 2019-07-04 ENCOUNTER — Encounter: Payer: Self-pay | Admitting: Physician Assistant

## 2019-07-04 ENCOUNTER — Ambulatory Visit (INDEPENDENT_AMBULATORY_CARE_PROVIDER_SITE_OTHER): Payer: Medicare Other | Admitting: Physician Assistant

## 2019-07-04 VITALS — BP 136/77 | HR 96 | Temp 96.6°F | Ht 66.0 in | Wt 170.8 lb

## 2019-07-04 DIAGNOSIS — N179 Acute kidney failure, unspecified: Secondary | ICD-10-CM

## 2019-07-04 DIAGNOSIS — E039 Hypothyroidism, unspecified: Secondary | ICD-10-CM

## 2019-07-04 DIAGNOSIS — K420 Umbilical hernia with obstruction, without gangrene: Secondary | ICD-10-CM

## 2019-07-05 LAB — CMP14+EGFR
ALT: 17 IU/L (ref 0–32)
AST: 21 IU/L (ref 0–40)
Albumin/Globulin Ratio: 1.6 (ref 1.2–2.2)
Albumin: 3.9 g/dL (ref 3.7–4.7)
Alkaline Phosphatase: 88 IU/L (ref 39–117)
BUN/Creatinine Ratio: 16 (ref 12–28)
BUN: 13 mg/dL (ref 8–27)
Bilirubin Total: 0.3 mg/dL (ref 0.0–1.2)
CO2: 24 mmol/L (ref 20–29)
Calcium: 9 mg/dL (ref 8.7–10.3)
Chloride: 103 mmol/L (ref 96–106)
Creatinine, Ser: 0.83 mg/dL (ref 0.57–1.00)
GFR calc Af Amer: 78 mL/min/{1.73_m2} (ref >59)
GFR calc non Af Amer: 68 mL/min/{1.73_m2} (ref >59)
Globulin, Total: 2.5 g/dL (ref 1.5–4.5)
Glucose: 101 mg/dL — ABNORMAL HIGH (ref 65–99)
Potassium: 4.1 mmol/L (ref 3.5–5.2)
Sodium: 142 mmol/L (ref 134–144)
Total Protein: 6.4 g/dL (ref 6.0–8.5)

## 2019-07-05 LAB — CBC WITH DIFFERENTIAL/PLATELET
Basophils Absolute: 0 10*3/uL (ref 0.0–0.2)
Basos: 1 %
EOS (ABSOLUTE): 0.1 10*3/uL (ref 0.0–0.4)
Eos: 2 %
Hematocrit: 38.3 % (ref 34.0–46.6)
Hemoglobin: 12.9 g/dL (ref 11.1–15.9)
Immature Grans (Abs): 0 10*3/uL (ref 0.0–0.1)
Immature Granulocytes: 0 %
Lymphocytes Absolute: 1.2 10*3/uL (ref 0.7–3.1)
Lymphs: 18 %
MCH: 28.8 pg (ref 26.6–33.0)
MCHC: 33.7 g/dL (ref 31.5–35.7)
MCV: 86 fL (ref 79–97)
Monocytes Absolute: 0.5 10*3/uL (ref 0.1–0.9)
Monocytes: 7 %
Neutrophils Absolute: 4.8 10*3/uL (ref 1.4–7.0)
Neutrophils: 72 %
Platelets: 191 10*3/uL (ref 150–450)
RBC: 4.48 x10E6/uL (ref 3.77–5.28)
RDW: 13.3 % (ref 11.7–15.4)
WBC: 6.6 10*3/uL (ref 3.4–10.8)

## 2019-07-05 LAB — TSH: TSH: 1.64 u[IU]/mL (ref 0.450–4.500)

## 2019-07-05 NOTE — Progress Notes (Signed)
BP 136/77   Pulse 96   Temp (!) 96.6 F (35.9 C) (Oral)   Ht 5\' 6"  (1.676 m)   Wt 170 lb 12.8 oz (77.5 kg)   BMI 27.57 kg/m    Subjective:    Patient ID: Hannah [name], female    DOB: [birth year], 78 y.o.   MRN: [id number]  HPI: Hannah [name] is a 78 y.o. female presenting on 07/04/2019 for Hospitalization Follow-up [facility])  This patient comes in for a follow-up for her hospitalization for her incarcerated medical hernia.  She had surgery to repair.  She was in the hospital for about 5 days.  She states that she has been doing much better.  The surgeon was very happy with how things went.  She is moving around and doing very well.  She is avoiding lifting.  She has not been driving yet.  We will do a follow-up lab because she did have an acute kidney injury from being dehydrated.   Past Medical History:  Diagnosis Date  . Hyperlipidemia   . Hypertension   . Thyroid disease    Relevant past medical, surgical, family and social history reviewed and updated as indicated. Interim medical history since our last visit reviewed. Allergies and medications reviewed and updated. DATA REVIEWED: CHART IN EPIC  Family History reviewed for pertinent findings.  Review of Systems  Constitutional: Negative.   HENT: Negative.   Eyes: Negative.   Respiratory: Negative.   Gastrointestinal: Negative.   Genitourinary: Negative.     Allergies as of 07/04/2019      Reactions   Penicillins Rash   Did it involve swelling of the face/tongue/throat, SOB, or low BP? Unknown Did it involve sudden or severe rash/hives, skin peeling, or any reaction on the inside of your mouth or nose? Ruta Hinds Did you need to seek medical attention at a hospital or doctor's office? No When did it last happen?childhood If all above answers are "NO", may proceed with cephalosporin use.   Sulfa Antibiotics Rash      Medication List       Accurate as of July 04, 2019 11:59 PM. If you have any questions,  ask your nurse or doctor.        atorvastatin 10 MG tablet Commonly known as: LIPITOR Take 10 mg by mouth every morning.   Calcium Carb-Cholecalciferol 600-400 MG-UNIT Tabs Take 1 tablet by mouth daily.   ondansetron 4 MG tablet Commonly known as: ZOFRAN Take 1 tablet (4 mg total) by mouth every 6 (six) hours as needed for nausea.   polyethylene glycol 17 g packet Commonly known as: MIRALAX / GLYCOLAX Take 17 g by mouth daily as needed for mild constipation.   Synthroid 75 MCG tablet Generic drug: levothyroxine Take 75 mcg by mouth every morning.   traMADol 50 MG tablet Commonly known as: ULTRAM Take 1 tablet (50 mg total) by mouth every 6 (six) hours as needed (mild pain).          Objective:    BP 136/77   Pulse 96   Temp (!) 96.6 F (35.9 C) (Oral)   Ht 5\' 6"  (1.676 m)   Wt 170 lb 12.8 oz (77.5 kg)   BMI 27.57 kg/m   Allergies  Allergen Reactions  . Penicillins Rash    Did it involve swelling of the face/tongue/throat, SOB, or low BP? Unknown Did it involve sudden or severe rash/hives, skin peeling, or any reaction on the inside of your mouth or nose? Ruta Hinds  Did you need to seek medical attention at a hospital or doctor's office? No When did it last happen?childhood If all above answers are "NO", may proceed with cephalosporin use.   . Sulfa Antibiotics Rash    Wt Readings from Last 3 Encounters:  07/04/19 170 lb 12.8 oz (77.5 kg)  06/20/19 177 lb 4 oz (80.4 kg)  06/14/19 174 lb 12.8 oz (79.3 kg)    Physical Exam Constitutional:      Appearance: She is well-developed.  HENT:     Head: Normocephalic and atraumatic.  Eyes:     Conjunctiva/sclera: Conjunctivae normal.     Pupils: Pupils are equal, round, and reactive to light.  Cardiovascular:     Rate and Rhythm: Normal rate and regular rhythm.     Heart sounds: Normal heart sounds.  Pulmonary:     Effort: Pulmonary effort is normal.     Breath sounds: Normal breath sounds.  Abdominal:      General: Bowel sounds are normal.     Palpations: Abdomen is soft.  Skin:    General: Skin is warm and dry.     Findings: No rash.  Neurological:     Mental Status: She is alert and oriented to person, place, and time.     Deep Tendon Reflexes: Reflexes are normal and symmetric.  Psychiatric:        Behavior: Behavior normal.        Thought Content: Thought content normal.        Judgment: Judgment normal.     Results for orders placed or performed in visit on 07/04/19  CBC with Differential/Platelet  Result Value Ref Range   WBC 6.6 3.4 - 10.8 x10E3/uL   RBC 4.48 3.77 - 5.28 x10E6/uL   Hemoglobin 12.9 11.1 - 15.9 g/dL   Hematocrit 40.9 81.1 - 46.6 %   MCV 86 79 - 97 fL   MCH 28.8 26.6 - 33.0 pg   MCHC 33.7 31.5 - 35.7 g/dL   RDW 91.4 78.2 - 95.6 %   Platelets 191 150 - 450 x10E3/uL   Neutrophils 72 Not Estab. %   Lymphs 18 Not Estab. %   Monocytes 7 Not Estab. %   Eos 2 Not Estab. %   Basos 1 Not Estab. %   Neutrophils Absolute 4.8 1.4 - 7.0 x10E3/uL   Lymphocytes Absolute 1.2 0.7 - 3.1 x10E3/uL   Monocytes Absolute 0.5 0.1 - 0.9 x10E3/uL   EOS (ABSOLUTE) 0.1 0.0 - 0.4 x10E3/uL   Basophils Absolute 0.0 0.0 - 0.2 x10E3/uL   Immature Granulocytes 0 Not Estab. %   Immature Grans (Abs) 0.0 0.0 - 0.1 x10E3/uL  CMP14+EGFR  Result Value Ref Range   Glucose 101 (H) 65 - 99 mg/dL   BUN 13 8 - 27 mg/dL   Creatinine, Ser 2.13 0.57 - 1.00 mg/dL   GFR calc non Af Amer 68 >59 mL/min/1.73   GFR calc Af Amer 78 >59 mL/min/1.73   BUN/Creatinine Ratio 16 12 - 28   Sodium 142 134 - 144 mmol/L   Potassium 4.1 3.5 - 5.2 mmol/L   Chloride 103 96 - 106 mmol/L   CO2 24 20 - 29 mmol/L   Calcium 9.0 8.7 - 10.3 mg/dL   Total Protein 6.4 6.0 - 8.5 g/dL   Albumin 3.9 3.7 - 4.7 g/dL   Globulin, Total 2.5 1.5 - 4.5 g/dL   Albumin/Globulin Ratio 1.6 1.2 - 2.2   Bilirubin Total 0.3 0.0 - 1.2 mg/dL   Alkaline  Phosphatase 88 39 - 117 IU/L   AST 21 0 - 40 IU/L   ALT 17 0 - 32 IU/L  TSH   Result Value Ref Range   TSH 1.640 0.450 - 4.500 uIU/mL      Assessment & Plan:   1. Acquired hypothyroidism - TSH  2. AKI (acute kidney injury) (HCC) - CBC with Differential/Platelet - CMP14+EGFR  3. Incarcerated umbilical hernia - CBC with Differential/Platelet - CMP14+EGFR   Continue all other maintenance medications as listed above.  Follow up plan: No follow-ups on file.  Educational handout given for survey  Remus Loffler PA-C [facility] [address]  [place], [place] [address] [phone]   07/05/2019, 8:40 PM

## 2019-07-06 DIAGNOSIS — M170 Bilateral primary osteoarthritis of knee: Secondary | ICD-10-CM | POA: Diagnosis not present

## 2019-07-06 DIAGNOSIS — Z48815 Encounter for surgical aftercare following surgery on the digestive system: Secondary | ICD-10-CM | POA: Diagnosis not present

## 2019-07-06 DIAGNOSIS — I10 Essential (primary) hypertension: Secondary | ICD-10-CM | POA: Diagnosis not present

## 2019-07-06 DIAGNOSIS — M19012 Primary osteoarthritis, left shoulder: Secondary | ICD-10-CM | POA: Diagnosis not present

## 2019-07-06 DIAGNOSIS — M19011 Primary osteoarthritis, right shoulder: Secondary | ICD-10-CM | POA: Diagnosis not present

## 2019-07-08 ENCOUNTER — Ambulatory Visit (INDEPENDENT_AMBULATORY_CARE_PROVIDER_SITE_OTHER): Payer: Medicare Other

## 2019-07-08 DIAGNOSIS — Z1159 Encounter for screening for other viral diseases: Secondary | ICD-10-CM

## 2019-07-08 DIAGNOSIS — R000 Tachycardia, unspecified: Secondary | ICD-10-CM

## 2019-07-08 DIAGNOSIS — M170 Bilateral primary osteoarthritis of knee: Secondary | ICD-10-CM | POA: Diagnosis not present

## 2019-07-08 DIAGNOSIS — I10 Essential (primary) hypertension: Secondary | ICD-10-CM | POA: Diagnosis not present

## 2019-07-08 DIAGNOSIS — M19011 Primary osteoarthritis, right shoulder: Secondary | ICD-10-CM | POA: Diagnosis not present

## 2019-07-08 DIAGNOSIS — M19012 Primary osteoarthritis, left shoulder: Secondary | ICD-10-CM | POA: Diagnosis not present

## 2019-07-08 DIAGNOSIS — Z79891 Long term (current) use of opiate analgesic: Secondary | ICD-10-CM

## 2019-07-08 DIAGNOSIS — E039 Hypothyroidism, unspecified: Secondary | ICD-10-CM

## 2019-07-08 DIAGNOSIS — E785 Hyperlipidemia, unspecified: Secondary | ICD-10-CM

## 2019-07-08 DIAGNOSIS — Z48815 Encounter for surgical aftercare following surgery on the digestive system: Secondary | ICD-10-CM

## 2019-07-08 DIAGNOSIS — Z9049 Acquired absence of other specified parts of digestive tract: Secondary | ICD-10-CM

## 2019-07-08 DIAGNOSIS — K8020 Calculus of gallbladder without cholecystitis without obstruction: Secondary | ICD-10-CM

## 2019-07-08 DIAGNOSIS — Z90711 Acquired absence of uterus with remaining cervical stump: Secondary | ICD-10-CM

## 2019-07-08 DIAGNOSIS — Z9181 History of falling: Secondary | ICD-10-CM

## 2019-07-08 DIAGNOSIS — N179 Acute kidney failure, unspecified: Secondary | ICD-10-CM

## 2019-07-08 DIAGNOSIS — K5790 Diverticulosis of intestine, part unspecified, without perforation or abscess without bleeding: Secondary | ICD-10-CM

## 2019-07-11 ENCOUNTER — Other Ambulatory Visit: Payer: Self-pay

## 2019-07-12 ENCOUNTER — Encounter: Payer: Self-pay | Admitting: General Surgery

## 2019-07-12 ENCOUNTER — Ambulatory Visit (INDEPENDENT_AMBULATORY_CARE_PROVIDER_SITE_OTHER): Payer: Self-pay | Admitting: General Surgery

## 2019-07-12 ENCOUNTER — Other Ambulatory Visit: Payer: Self-pay

## 2019-07-12 VITALS — BP 137/80 | HR 100 | Temp 99.1°F | Resp 16 | Ht 66.0 in | Wt 170.0 lb

## 2019-07-12 DIAGNOSIS — Z09 Encounter for follow-up examination after completed treatment for conditions other than malignant neoplasm: Secondary | ICD-10-CM

## 2019-07-12 NOTE — Progress Notes (Signed)
Subjective:     Hannah [name]  Here for postoperative visit.  Doing well.  No nausea or vomiting have been noted.  She is ambulating well.  She denies any pain.  She denies any fever or chills. Objective:    BP 137/80 (BP Location: Left Arm, Patient Position: Sitting, Cuff Size: Normal)   Pulse 100   Temp 99.1 F (37.3 C) (Temporal)   Resp 16   Ht 5\' 6"  (1.676 m)   Wt 170 lb (77.1 kg)   SpO2 94%   BMI 27.44 kg/m   General:  alert, cooperative and no distress  Abdomen soft, incision healing well.     Assessment:    Doing well postoperatively.    Plan:   May resume normal activity.  Follow-up here as needed.

## 2019-07-13 DIAGNOSIS — M19011 Primary osteoarthritis, right shoulder: Secondary | ICD-10-CM | POA: Diagnosis not present

## 2019-07-13 DIAGNOSIS — I10 Essential (primary) hypertension: Secondary | ICD-10-CM | POA: Diagnosis not present

## 2019-07-13 DIAGNOSIS — Z48815 Encounter for surgical aftercare following surgery on the digestive system: Secondary | ICD-10-CM | POA: Diagnosis not present

## 2019-07-13 DIAGNOSIS — M170 Bilateral primary osteoarthritis of knee: Secondary | ICD-10-CM | POA: Diagnosis not present

## 2019-07-13 DIAGNOSIS — M19012 Primary osteoarthritis, left shoulder: Secondary | ICD-10-CM | POA: Diagnosis not present

## 2019-07-20 DIAGNOSIS — M19011 Primary osteoarthritis, right shoulder: Secondary | ICD-10-CM | POA: Diagnosis not present

## 2019-07-20 DIAGNOSIS — Z48815 Encounter for surgical aftercare following surgery on the digestive system: Secondary | ICD-10-CM | POA: Diagnosis not present

## 2019-07-20 DIAGNOSIS — M170 Bilateral primary osteoarthritis of knee: Secondary | ICD-10-CM | POA: Diagnosis not present

## 2019-07-20 DIAGNOSIS — I10 Essential (primary) hypertension: Secondary | ICD-10-CM | POA: Diagnosis not present

## 2019-07-20 DIAGNOSIS — M19012 Primary osteoarthritis, left shoulder: Secondary | ICD-10-CM | POA: Diagnosis not present

## 2019-09-07 DIAGNOSIS — H4311 Vitreous hemorrhage, right eye: Secondary | ICD-10-CM | POA: Diagnosis not present

## 2019-09-08 ENCOUNTER — Ambulatory Visit (INDEPENDENT_AMBULATORY_CARE_PROVIDER_SITE_OTHER): Payer: Medicare Other

## 2019-09-08 DIAGNOSIS — Z23 Encounter for immunization: Secondary | ICD-10-CM

## 2019-09-20 ENCOUNTER — Other Ambulatory Visit: Payer: Self-pay | Admitting: Physician Assistant

## 2019-09-26 ENCOUNTER — Encounter: Payer: Self-pay | Admitting: Family

## 2019-09-26 ENCOUNTER — Ambulatory Visit (INDEPENDENT_AMBULATORY_CARE_PROVIDER_SITE_OTHER): Payer: Medicare Other | Admitting: Family

## 2019-09-26 DIAGNOSIS — R399 Unspecified symptoms and signs involving the genitourinary system: Secondary | ICD-10-CM

## 2019-09-26 MED ORDER — CEPHALEXIN 500 MG PO CAPS: 500.0000 mg | ORAL_CAPSULE | Freq: Two times a day (BID) | ORAL | 0 refills | Status: DC

## 2019-09-26 NOTE — Progress Notes (Addendum)
   Virtual Visit via telephone Note Due to COVID-19 pandemic this visit was conducted virtually. This visit type was conducted due to national recommendations for restrictions regarding the COVID-19 Pandemic (e.g. social distancing, sheltering in place) in an effort to limit this patient's exposure and mitigate transmission in our community. All issues noted in this document were discussed and addressed.  A physical exam was not performed with this format.  Attempted to call patient at 10:50 AM, no answer VM left.  Attempted to call patient at 11:10 AM no answer, VM left.   I connected with Hannah [name] on 09/26/19 at 11:48 AM  by telephone and verified that I am speaking with the correct person using two identifiers. Hannah [name] is currently located at home and no one is currently with her during visit. The provider, Evelina Dun, FNP is located in their office at time of visit.  I discussed the limitations, risks, security and privacy concerns of performing an evaluation and management service by telephone and the availability of in person appointments. I also discussed with the patient that there may be a patient responsible charge related to this service. The patient expressed understanding and agreed to proceed.   History and Present Illness:  Dysuria  This is a new problem. The current episode started in the past 7 days. The problem occurs every urination. The problem has been gradually worsening. The quality of the pain is described as burning. The pain is at a severity of 7/10. The pain is mild. Associated symptoms include frequency, hesitancy and urgency. Pertinent negatives include no flank pain, hematuria, nausea or vomiting. She has tried increased fluids for the symptoms. The treatment provided mild relief.      Review of Systems  Gastrointestinal: Negative for nausea and vomiting.  Genitourinary: Positive for dysuria, frequency, hesitancy and urgency. Negative for flank pain  and hematuria.  All other systems reviewed and are negative.    Observations/Objective: No SOB or distress noted  Assessment and Plan: 1. UTI symptoms Force fluids AZO over the counter X2 days RTO if symptoms worsen or do not improve  - cephALEXin (KEFLEX) 500 MG capsule; Take 1 capsule (500 mg total) by mouth 2 (two) times daily.  Dispense: 14 capsule; Refill: 0    I discussed the assessment and treatment plan with the patient. The patient was provided an opportunity to ask questions and all were answered. The patient agreed with the plan and demonstrated an understanding of the instructions.   The patient was advised to call back or seek an in-person evaluation if the symptoms worsen or if the condition fails to improve as anticipated.  The above assessment and management plan was discussed with the patient. The patient verbalized understanding of and has agreed to the management plan. Patient is aware to call the clinic if symptoms persist or worsen. Patient is aware when to return to the clinic for a follow-up visit. Patient educated on when it is appropriate to go to the emergency department.   Time call ended:  11:56 AM  I provided  8 minutes of non-face-to-face time during this encounter.     Evelina Dun, FNP

## 2019-09-26 NOTE — Addendum Note (Signed)
Addended by: Evelina Dun A on: 09/26/2019 11:56 AM   Modules accepted: Orders, Level of Service

## 2019-09-29 IMAGING — CT CT ANGIOGRAPHY CHEST
2 of 6 series · 18 of 46 positions shown · IV contrast (Isovue)
Comparison: None.

CLINICAL DATA: Hypoxia.  Positive D-dimer.

EXAM:
CT ANGIOGRAPHY CHEST WITH CONTRAST
TECHNIQUE: Multidetector CT imaging of the chest was performed using the
standard protocol during bolus administration of intravenous
contrast. Multiplanar CT image reconstructions and MIPs were
obtained to evaluate the vascular anatomy.
CONTRAST:  100mL OMNIPAQUE IOHEXOL 350 MG/ML SOLN

[Series 5: 3 thins · axial · 0.74mm/px · z∈[+1262,+1544]mm · 15 of 311 slices shown]
[im 14/311  lung]
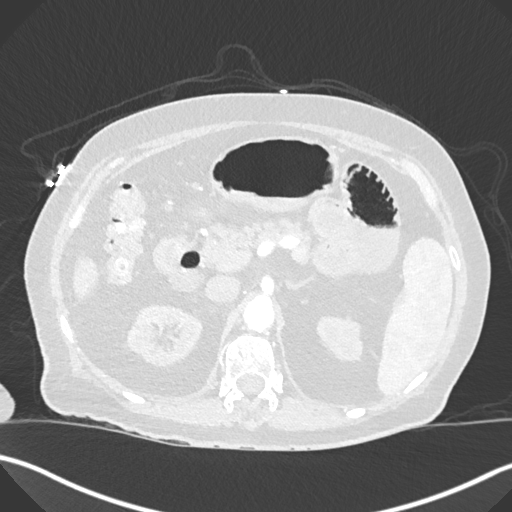
[im 41/311  soft-tissue]
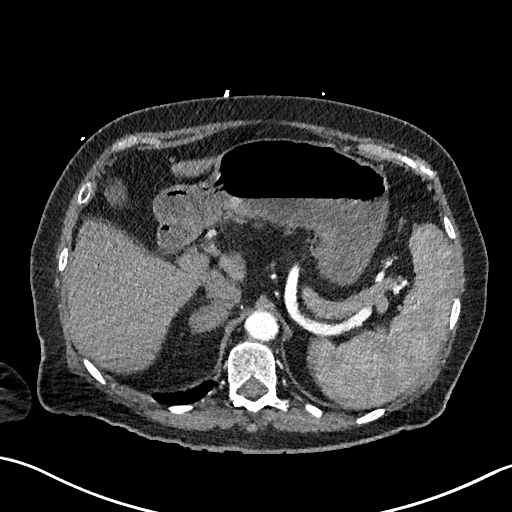
[im 54/311  lung]
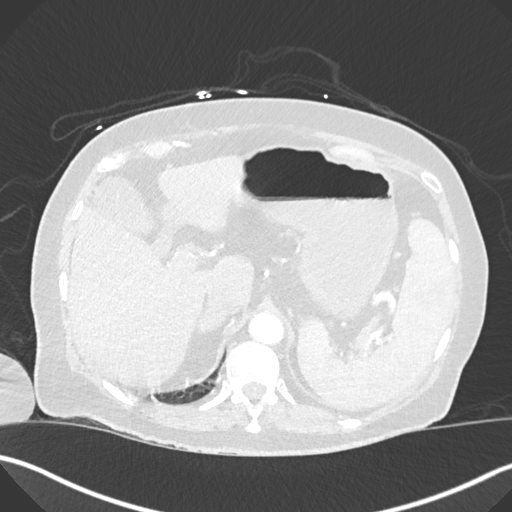
[im 81/311  soft-tissue]
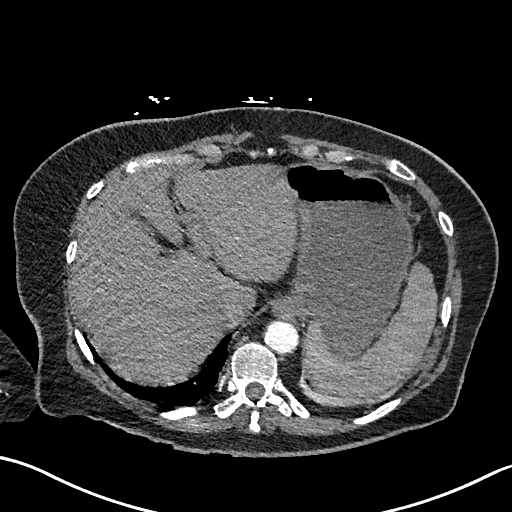
[im 95/311  lung]
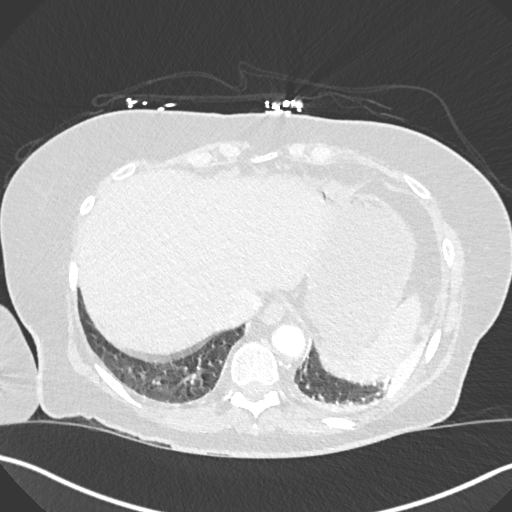
[im 122/311  soft-tissue]
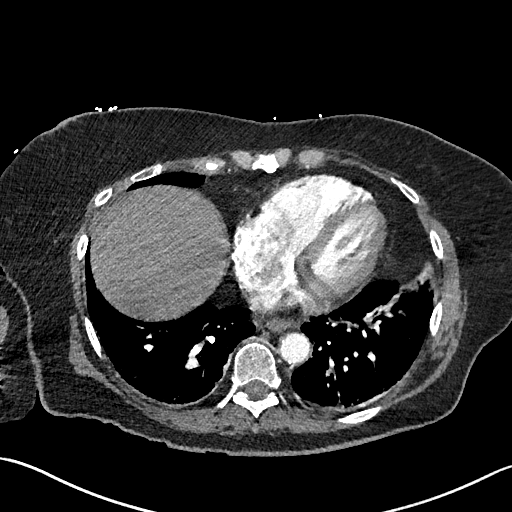
[im 135/311  lung]
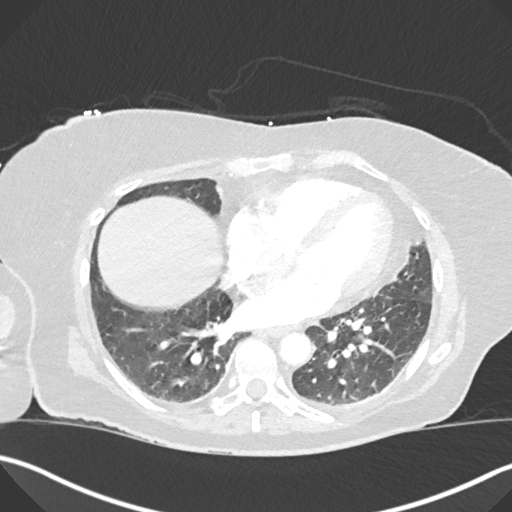
[im 162/311  soft-tissue]
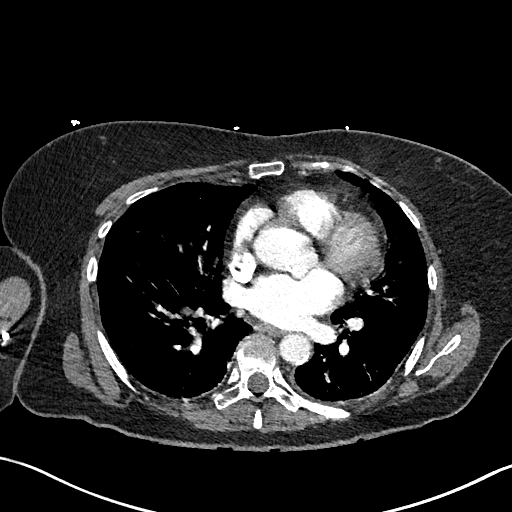
[im 176/311  lung]
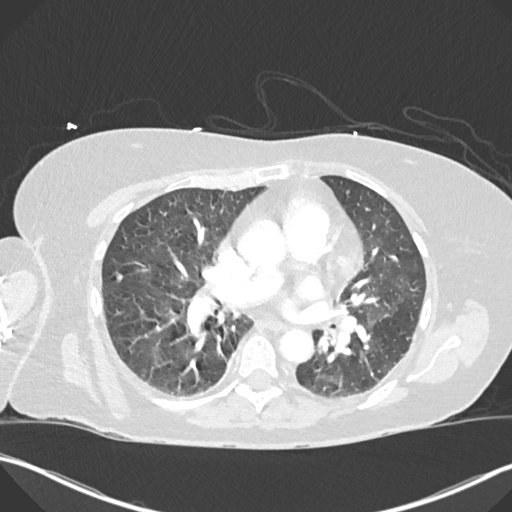
[im 189/311  soft-tissue]
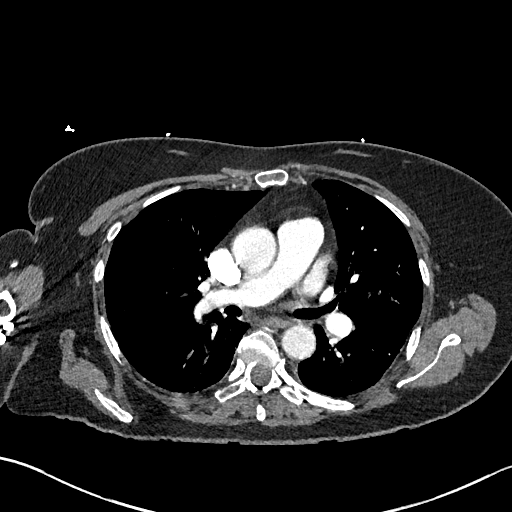
[im 216/311  lung]
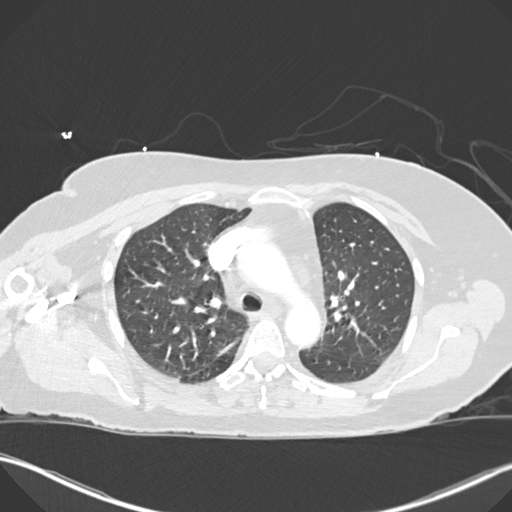
[im 230/311  soft-tissue]
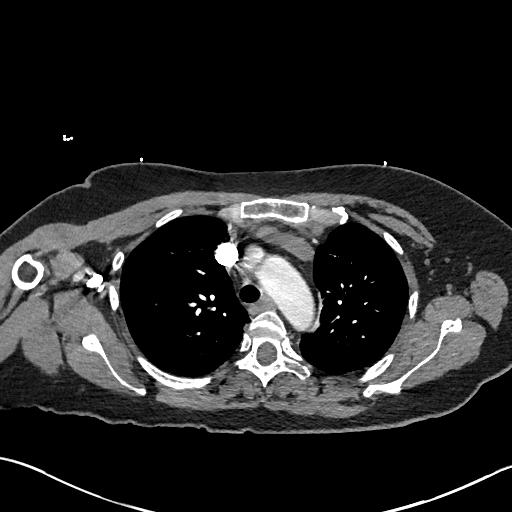
[im 257/311  lung]
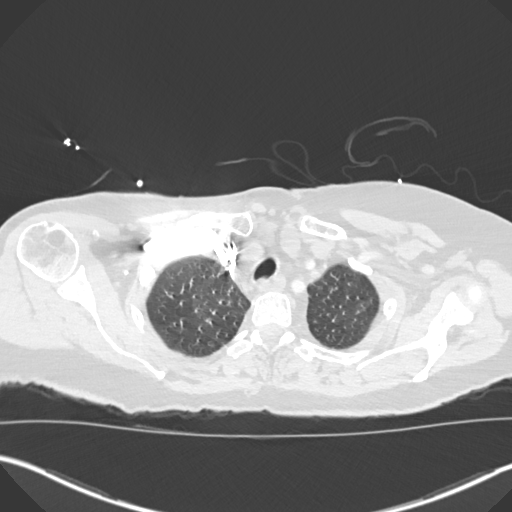
[im 270/311  soft-tissue]
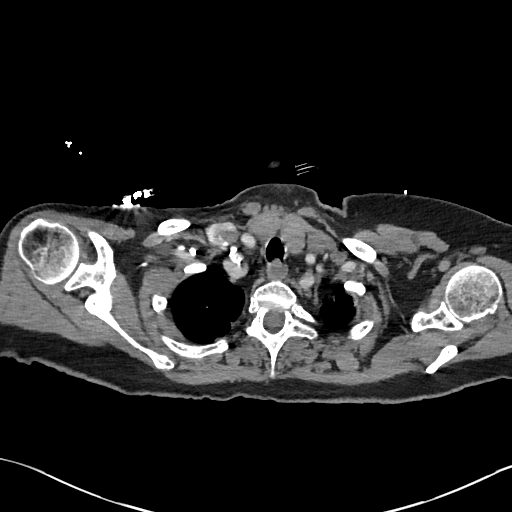
[im 297/311  lung]
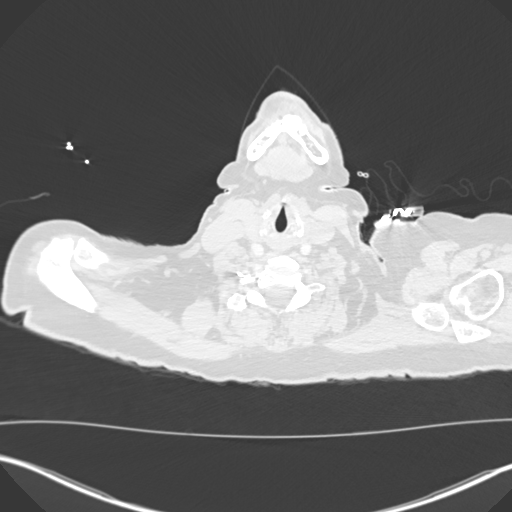

[Series 7: coronal mpr · coronal · 0.67mm/px · 3 of 143 slices shown]
[im 36/143  soft-tissue]
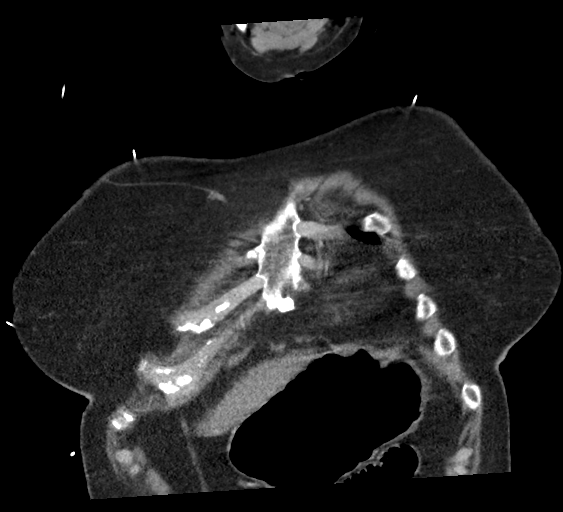
[im 72/143  soft-tissue]
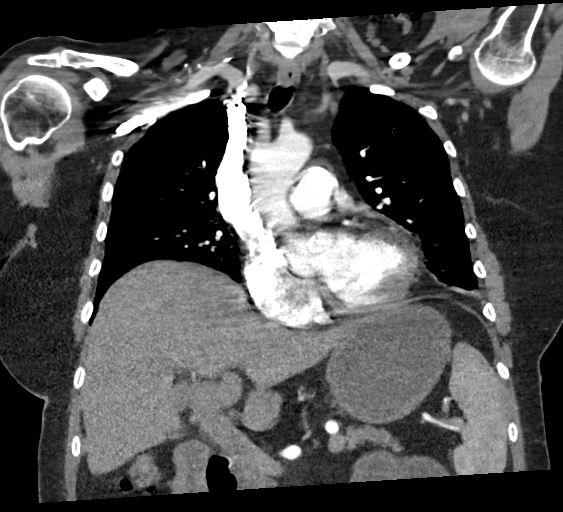
[im 107/143  soft-tissue]
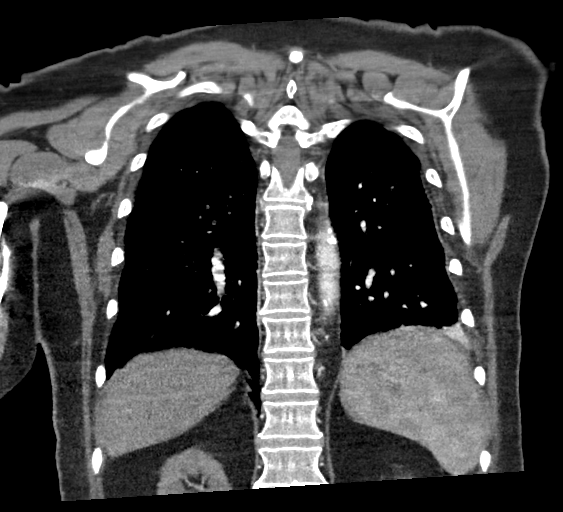

[18 of 46 positions shown; findings below may reference images not displayed]

FINDINGS: Cardiovascular: No filling defects in the pulmonary arteries to
suggest pulmonary emboli. Coronary artery calcifications in the left
anterior descending and left circumflex coronary arteries.
Descending aortic calcifications. No aneurysm or dissection.

Mediastinum/Nodes: No mediastinal, hilar, or axillary adenopathy.
Trachea and esophagus are unremarkable. Thyroid unremarkable.

Lungs/Pleura: Left base opacity, likely atelectasis. Small nodule in
the right middle lobe measures 5 mm. No effusions.

Upper Abdomen: Gallstones noted within the gallbladder. The spleen
is not imaged in its entirety, but appears enlarged. Recommend
clinical correlation. Left adrenal nodule measures up to 2.8 cm.

Musculoskeletal: Chest wall soft tissues are unremarkable. No acute
bony abnormality.

Review of the MIP images confirms the above findings.
IMPRESSION: No evidence of pulmonary embolus.

Coronary artery disease.

5 mm right middle lobe pulmonary nodule peripherally. No follow-up
needed if patient is low-risk. Non-contrast chest CT can be
considered in 12 months if patient is high-risk. This recommendation
follows the consensus statement: Guidelines for Management of
Incidental Pulmonary Nodules Detected on CT Images: From the

Cholelithiasis.

Spleen appears enlarged but not imaged in its entirety. Recommend
clinical correlation.

Aortic Atherosclerosis ([id number]-[id number].F).

## 2019-10-24 ENCOUNTER — Other Ambulatory Visit: Payer: Self-pay

## 2019-10-24 ENCOUNTER — Ambulatory Visit (INDEPENDENT_AMBULATORY_CARE_PROVIDER_SITE_OTHER): Payer: Medicare Other | Admitting: *Deleted

## 2019-10-24 DIAGNOSIS — Z0000 Encounter for general adult medical examination without abnormal findings: Secondary | ICD-10-CM | POA: Diagnosis not present

## 2019-10-24 NOTE — Progress Notes (Signed)
MEDICARE ANNUAL WELLNESS VISIT  10/24/2019  Telephone Visit Disclaimer This Medicare AWV was conducted by telephone due to national recommendations for restrictions regarding the COVID-19 Pandemic (e.g. social distancing).  I verified, using two identifiers, that I am speaking with Hannah [name] or their authorized healthcare agent. I discussed the limitations, risks, security, and privacy concerns of performing an evaluation and management service by telephone and the potential availability of an in-person appointment in the future. The patient expressed understanding and agreed to proceed.   Subjective:  Hannah [name] is a 78 y.o. female patient of Remus Loffler, PA-C who had a Medicare Annual Wellness Visit today via telephone. Hannah [name] is Retired and lives alone. she has1daughter. she reports that she is socially active and does interact with friends/family regularly. she is minimally physically active and enjoys gardening.  Patient Care Team: Caryl Never as PCP - General (Physician Assistant)  Advanced Directives 10/24/2019 06/22/2019 06/20/2019 06/20/2019  Does Patient Have a Medical Advance Directive? No No No No  Would patient like information on creating a medical advance directive? No - Patient declined - No - Guardian declined -    Hospital Utilization Over the Past 12 Months: # of hospitalizations or ER visits: 1 # of surgeries: 1  Review of Systems    Patient reports that her overall health is unchanged compared to last year.  History obtained from chart review and the patient  Patient Reported Readings (BP, Pulse, CBG, Weight, etc) none  Pain Assessment Pain : No/denies pain     Current Medications & Allergies (verified) Allergies as of 10/24/2019      Reactions   Penicillins Rash   Did it involve swelling of the face/tongue/throat, SOB, or low BP? Unknown Did it involve sudden or severe rash/hives, skin peeling, or any reaction on the inside of  your mouth or nose? Ruta Hinds Did you need to seek medical attention at a hospital or doctor's office? No When did it last happen?childhood If all above answers are "NO", may proceed with cephalosporin use.   Sulfa Antibiotics Rash      Medication List       Accurate as of October 24, 2019 10:20 AM. If you have any questions, ask your nurse or doctor.        STOP taking these medications   cephALEXin 500 MG capsule Commonly known as: KEFLEX   ondansetron 4 MG tablet Commonly known as: ZOFRAN   traMADol 50 MG tablet Commonly known as: ULTRAM     TAKE these medications   atorvastatin 10 MG tablet Commonly known as: LIPITOR TAKE 1 TABLET BY MOUTH EVERY DAY   Calcium Carb-Cholecalciferol 600-400 MG-UNIT Tabs Take 1 tablet by mouth daily.   lisinopril-hydrochlorothiazide 20-25 MG tablet Commonly known as: ZESTORETIC Take 0.5 tablets by mouth daily.   polyethylene glycol 17 g packet Commonly known as: MIRALAX / GLYCOLAX Take 17 g by mouth daily as needed for mild constipation.   Synthroid 75 MCG tablet Generic drug: levothyroxine Take 75 mcg by mouth every morning.       History (reviewed): Past Medical History:  Diagnosis Date  . Hyperlipidemia   . Hypertension   . Thyroid disease    Past Surgical History:  Procedure Laterality Date  . ABDOMINAL HYSTERECTOMY    . APPENDECTOMY    . BLADDER SURGERY    . EYE SURGERY    . UMBILICAL HERNIA REPAIR N/A 06/22/2019   Procedure: HERNIA REPAIR UMBILICAL ADULT;  Surgeon: Franky Macho,  MD;  Location: [facility];  Service: General;  Laterality: N/A;   Family History  Problem Relation Age of Onset  . Stroke Mother   . Heart disease Mother   . Heart disease Father   . Stroke Father   . Cancer Father   . Cancer Sister   . Aneurysm Brother    Social History   Socioeconomic History  . Marital status: Widowed    Spouse name: Not on file  . Number of children: 1  . Years of education: 63  . Highest education  level: Not on file  Occupational History  . Not on file  Social Needs  . Financial resource strain: Somewhat hard  . Food insecurity    Worry: Never true    Inability: Never true  . Transportation needs    Medical: No    Non-medical: No  Tobacco Use  . Smoking status: Never Smoker  . Smokeless tobacco: Never Used  Substance and Sexual Activity  . Alcohol use: Never    Frequency: Never  . Drug use: Never  . Sexual activity: Not Currently  Lifestyle  . Physical activity    Days per week: 3 days    Minutes per session: 30 min  . Stress: Not at all  Relationships  . Social Musician on phone: Once a week    Gets together: Once a week    Attends religious service: 1 to 4 times per year    Active member of club or organization: No    Attends meetings of clubs or organizations: Never    Relationship status: Widowed  Other Topics Concern  . Not on file  Social History Narrative  . Not on file    Activities of Daily Living In your present state of health, do you have any difficulty performing the following activities: 10/24/2019 06/20/2019  Hearing? N -  Vision? N -  Comment wears glasses -  Difficulty concentrating or making decisions? N -  Walking or climbing stairs? N -  Dressing or bathing? N -  Doing errands, shopping? N N  Preparing Food and eating ? N -  Using the Toilet? N -  In the past six months, have you accidently leaked urine? N -  Do you have problems with loss of bowel control? N -  Managing your Medications? N -  Managing your Finances? N -  Housekeeping or managing your Housekeeping? N -  Some recent data might be hidden    Patient Education/ Literacy How often do you need to have someone help you when you read instructions, pamphlets, or other written materials from your doctor or pharmacy?: 1 - Never What is the last grade level you completed in school?: 12  Exercise Current Exercise Habits: Home exercise routine, Type of exercise:  walking, Time (Minutes): 30, Frequency (Times/Week): 3, Weekly Exercise (Minutes/Week): 90, Intensity: Mild  Diet Patient reports consuming 3 meals a day and 0 snack(s) a day Patient reports that her primary diet is: Regular Patient reports that she does have regular access to food.   Depression Screen PHQ 2/9 Scores 10/24/2019 07/04/2019 06/14/2019  PHQ - 2 Score 0 0 0     Fall Risk Fall Risk  10/24/2019 07/04/2019 06/14/2019  Falls in the past year? 0 0 0  Follow up Falls prevention discussed - -     Objective:  Hannah [name] seemed alert and oriented and she participated appropriately during our telephone visit.  Blood Pressure Weight BMI  BP Readings from Last 3 Encounters:  07/12/19 137/80  07/04/19 136/77  06/25/19 (!) 121/48   Wt Readings from Last 3 Encounters:  07/12/19 170 lb (77.1 kg)  07/04/19 170 lb 12.8 oz (77.5 kg)  06/20/19 177 lb 4 oz (80.4 kg)   BMI Readings from Last 1 Encounters:  07/12/19 27.44 kg/m    *Unable to obtain current vital signs, weight, and BMI due to telephone visit type  Hearing/Vision  . Rhunette CroftMildred did not seem to have difficulty with hearing/understanding during the telephone conversation . Reports that she has had a formal eye exam by an eye care professional within the past year . Reports that she has not had a formal hearing evaluation within the past year *Unable to fully assess hearing and vision during telephone visit type  Cognitive Function: 6CIT Screen 10/24/2019  What Year? 0 [name]  What month? 0 [name]  What time? 0 [name]  Count back from 20 2 [name]  Months in reverse 0 [name]  Repeat phrase 4 [name]  Total Score 6   (Normal:0-7, Significant for Dysfunction: >8)  Normal Cognitive Function Screening: Yes   Immunization & Health Maintenance Record Immunization History  Administered Date(s) Administered  . Fluad Quad(high Dose 65+) 09/08/2019  . Influenza, High Dose Seasonal PF 09/10/2016, 08/10/2017, 09/09/2018  .  Influenza, Seasonal, Injecte, Preservative Fre 09/14/2014, 09/03/2015  . Pneumococcal Conjugate-13 08/03/2015  . Pneumococcal Polysaccharide-23 12/09/2016  . Tdap 08/03/2015    Health Maintenance  Topic Date Due  . DEXA SCAN  02/01/2006  . TETANUS/TDAP  08/02/2025  . INFLUENZA VACCINE  Completed  . PNA vac Low Risk Adult  Completed       Assessment  This is a routine wellness examination for [name].  Health Maintenance: Due or Overdue Health Maintenance Due  Topic Date Due  . DEXA SCAN  02/01/2006    Hannah PointsMildred Banko does not need a referral for Community Assistance: Care Management:   no Social Work:    no Prescription Assistance:  no Nutrition/Diabetes Education:  no   Plan:  Personalized Goals Goals Addressed            This Visit's Progress   . AWV       10/24/2019 AWV Goal: Fall Prevention  . Over the next year, patient will decrease their risk for falls by: o Using assistive devices, such as a cane or walker, as needed o Identifying fall risks within their home and correcting them by: - Removing throw rugs - Adding handrails to stairs or ramps - Removing clutter and keeping a clear pathway throughout the home - Increasing light, especially at night - Adding shower handles/bars - Raising toilet seat o Identifying potential personal risk factors for falls: - Medication side effects - Incontinence/urgency - Vestibular dysfunction - Hearing loss - Musculoskeletal disorders - Neurological disorders - Orthostatic hypotension        Personalized Health Maintenance & Screening Recommendations  Bone densitometry screening  Lung Cancer Screening Recommended: no (Low Dose CT Chest recommended if Age 81-80 years, 30 pack-year currently smoking OR have quit w/in past 15 years) Hepatitis C Screening recommended: no HIV Screening recommended: no  Advanced Directives: Written information was not prepared per patient's request.  Referrals & Orders No  orders of the defined types were placed in this encounter.   Follow-up Plan . Follow-up with Remus LofflerJones, Angel S, PA-C as planned . We will discuss dexa scan at your next appointment.     I have personally reviewed and  noted the following in the patient's chart:   . Medical and social history . Use of alcohol, tobacco or illicit drugs  . Current medications and supplements . Functional ability and status . Nutritional status . Physical activity . Advanced directives . List of other physicians . Hospitalizations, surgeries, and ER visits in previous 12 months . Vitals . Screenings to include cognitive, depression, and falls . Referrals and appointments  In addition, I have reviewed and discussed with [name] certain preventive protocols, quality metrics, and best practice recommendations. A written personalized care plan for preventive services as well as general preventive health recommendations is available and can be mailed to the patient at her request.      Gareth Morgan  10/24/2019

## 2019-11-03 ENCOUNTER — Encounter: Payer: Self-pay | Admitting: Nurse Practitioner

## 2019-11-03 ENCOUNTER — Ambulatory Visit (INDEPENDENT_AMBULATORY_CARE_PROVIDER_SITE_OTHER): Payer: Medicare Other | Admitting: Nurse Practitioner

## 2019-11-03 DIAGNOSIS — R399 Unspecified symptoms and signs involving the genitourinary system: Secondary | ICD-10-CM | POA: Diagnosis not present

## 2019-11-03 MED ORDER — CIPROFLOXACIN HCL 500 MG PO TABS: 500.0000 mg | ORAL_TABLET | Freq: Two times a day (BID) | ORAL | 0 refills | Status: DC

## 2019-11-03 MED ORDER — PHENAZOPYRIDINE HCL 100 MG PO TABS: 100.0000 mg | ORAL_TABLET | Freq: Three times a day (TID) | ORAL | 0 refills | Status: DC | PRN

## 2019-11-03 NOTE — Progress Notes (Signed)
   Virtual Visit via telephone Note Due to COVID-19 pandemic this visit was conducted virtually. This visit type was conducted due to national recommendations for restrictions regarding the COVID-19 Pandemic (e.g. social distancing, sheltering in place) in an effort to limit this patient's exposure and mitigate transmission in our community. All issues noted in this document were discussed and addressed.  A physical exam was not performed with this format.  I connected with Hannah [name] on 11/03/19 at 8:40 by telephone and verified that I am speaking with the correct person using two identifiers. Hannah [name] is currently located at home and no one is currently with her during visit. The provider, Mary-Margaret Hassell Done, FNP is located in their office at time of visit.  I discussed the limitations, risks, security and privacy concerns of performing an evaluation and management service by telephone and the availability of in person appointments. I also discussed with the patient that there may be a patient responsible charge related to this service. The patient expressed understanding and agreed to proceed.   History and Present Illness:   Chief Complaint: Urinary Tract Infection   HPI Patient calls in for telephone appointment with c/o dysuria. Trouble getting stream started and lots of pelvic pressure. Started in the middle of the night.   Review of Systems  Constitutional: Negative for chills and fever.  HENT: Negative.   Genitourinary: Positive for dysuria, frequency and urgency. Negative for flank pain and hematuria.  Neurological: Negative.   Psychiatric/Behavioral: Negative.   All other systems reviewed and are negative.    Observations/Objective: Alert and oriented- answers all questions appropriately No distress    Assessment and Plan: Hannah [name] in today with chief complaint of Urinary Tract Infection   1. UTI symptoms Take medication as prescribe Cotton underwear  Take shower not bath Cranberry juice, yogurt Force fluids AZO over the counter X2 days RTO prn  - ciprofloxacin (CIPRO) 500 MG tablet; Take 1 tablet (500 mg total) by mouth 2 (two) times daily.  Dispense: 10 tablet; Refill: 0 - phenazopyridine (PYRIDIUM) 100 MG tablet; Take 1 tablet (100 mg total) by mouth 3 (three) times daily as needed for pain.  Dispense: 6 tablet; Refill: 0   Follow Up Instructions: Keep appointment in December with ALadona Mow    I discussed the assessment and treatment plan with the patient. The patient was provided an opportunity to ask questions and all were answered. The patient agreed with the plan and demonstrated an understanding of the instructions.   The patient was advised to call back or seek an in-person evaluation if the symptoms worsen or if the condition fails to improve as anticipated.  The above assessment and management plan was discussed with the patient. The patient verbalized understanding of and has agreed to the management plan. Patient is aware to call the clinic if symptoms persist or worsen. Patient is aware when to return to the clinic for a follow-up visit. Patient educated on when it is appropriate to go to the emergency department.   Time call ended:  8:50  I provided 10 minutes of non-face-to-face time during this encounter.    Mary-Margaret Hassell Done, FNP

## 2019-11-09 ENCOUNTER — Telehealth: Payer: Self-pay | Admitting: Physician Assistant

## 2019-11-09 NOTE — Telephone Encounter (Signed)
There is an active future order that was placed in July present in epic

## 2019-11-09 NOTE — Telephone Encounter (Signed)
Pt aware.

## 2019-11-10 ENCOUNTER — Other Ambulatory Visit: Payer: Medicare Other

## 2019-11-10 ENCOUNTER — Other Ambulatory Visit: Payer: Self-pay

## 2019-11-10 DIAGNOSIS — I10 Essential (primary) hypertension: Secondary | ICD-10-CM | POA: Diagnosis not present

## 2019-11-10 DIAGNOSIS — E785 Hyperlipidemia, unspecified: Secondary | ICD-10-CM | POA: Diagnosis not present

## 2019-11-10 DIAGNOSIS — E039 Hypothyroidism, unspecified: Secondary | ICD-10-CM | POA: Diagnosis not present

## 2019-11-11 ENCOUNTER — Other Ambulatory Visit: Payer: Self-pay

## 2019-11-11 LAB — CMP14+EGFR
ALT: 13 IU/L (ref 0–32)
AST: 19 IU/L (ref 0–40)
Albumin/Globulin Ratio: 1.8 (ref 1.2–2.2)
Albumin: 4.4 g/dL (ref 3.7–4.7)
Alkaline Phosphatase: 91 IU/L (ref 39–117)
BUN/Creatinine Ratio: 18 (ref 12–28)
BUN: 17 mg/dL (ref 8–27)
Bilirubin Total: 0.4 mg/dL (ref 0.0–1.2)
CO2: 24 mmol/L (ref 20–29)
Calcium: 9.3 mg/dL (ref 8.7–10.3)
Chloride: 103 mmol/L (ref 96–106)
Creatinine, Ser: 0.93 mg/dL (ref 0.57–1.00)
GFR calc Af Amer: 68 mL/min/{1.73_m2} (ref >59)
GFR calc non Af Amer: 59 mL/min/{1.73_m2} — ABNORMAL LOW (ref >59)
Globulin, Total: 2.5 g/dL (ref 1.5–4.5)
Glucose: 94 mg/dL (ref 65–99)
Potassium: 3.6 mmol/L (ref 3.5–5.2)
Sodium: 142 mmol/L (ref 134–144)
Total Protein: 6.9 g/dL (ref 6.0–8.5)

## 2019-11-11 LAB — CBC WITH DIFFERENTIAL/PLATELET
Basophils Absolute: 0 10*3/uL (ref 0.0–0.2)
Basos: 1 %
EOS (ABSOLUTE): 0.1 10*3/uL (ref 0.0–0.4)
Eos: 1 %
Hematocrit: 42 % (ref 34.0–46.6)
Hemoglobin: 14.1 g/dL (ref 11.1–15.9)
Immature Grans (Abs): 0 10*3/uL (ref 0.0–0.1)
Immature Granulocytes: 0 %
Lymphocytes Absolute: 1.6 10*3/uL (ref 0.7–3.1)
Lymphs: 32 %
MCH: 28.6 pg (ref 26.6–33.0)
MCHC: 33.6 g/dL (ref 31.5–35.7)
MCV: 85 fL (ref 79–97)
Monocytes Absolute: 0.4 10*3/uL (ref 0.1–0.9)
Monocytes: 8 %
Neutrophils Absolute: 2.9 10*3/uL (ref 1.4–7.0)
Neutrophils: 58 %
Platelets: 145 10*3/uL — ABNORMAL LOW (ref 150–450)
RBC: 4.93 x10E6/uL (ref 3.77–5.28)
RDW: 13.5 % (ref 11.7–15.4)
WBC: 4.9 10*3/uL (ref 3.4–10.8)

## 2019-11-11 LAB — LIPID PANEL
Chol/HDL Ratio: 3.1 ratio (ref 0.0–4.4)
Cholesterol, Total: 136 mg/dL (ref 100–199)
HDL: 44 mg/dL (ref >39)
LDL Chol Calc (NIH): 69 mg/dL (ref 0–99)
Triglycerides: 127 mg/dL (ref 0–149)
VLDL Cholesterol Cal: 23 mg/dL (ref 5–40)

## 2019-11-11 LAB — TSH: TSH: 1.27 u[IU]/mL (ref 0.450–4.500)

## 2019-11-14 ENCOUNTER — Other Ambulatory Visit: Payer: Self-pay

## 2019-11-14 ENCOUNTER — Ambulatory Visit (INDEPENDENT_AMBULATORY_CARE_PROVIDER_SITE_OTHER): Payer: Medicare Other | Admitting: Physician Assistant

## 2019-11-14 ENCOUNTER — Encounter: Payer: Self-pay | Admitting: Physician Assistant

## 2019-11-14 VITALS — BP 135/77 | HR 96 | Temp 97.5°F | Ht 66.0 in | Wt 174.0 lb

## 2019-11-14 DIAGNOSIS — R300 Dysuria: Secondary | ICD-10-CM

## 2019-11-14 DIAGNOSIS — E7841 Elevated Lipoprotein(a): Secondary | ICD-10-CM

## 2019-11-14 DIAGNOSIS — E039 Hypothyroidism, unspecified: Secondary | ICD-10-CM

## 2019-11-14 DIAGNOSIS — I10 Essential (primary) hypertension: Secondary | ICD-10-CM

## 2019-11-14 LAB — URINALYSIS, COMPLETE
Bilirubin, UA: NEGATIVE
Glucose, UA: NEGATIVE
Ketones, UA: NEGATIVE
Nitrite, UA: NEGATIVE
Protein,UA: NEGATIVE
Specific Gravity, UA: 1.02 (ref 1.005–1.030)
Urobilinogen, Ur: 0.2 mg/dL (ref 0.2–1.0)
pH, UA: 6.5 (ref 5.0–7.5)

## 2019-11-14 LAB — MICROSCOPIC EXAMINATION

## 2019-11-14 MED ORDER — SYNTHROID 75 MCG PO TABS: 75.0000 ug | ORAL_TABLET | Freq: Every morning | ORAL | 3 refills | Status: DC

## 2019-11-15 LAB — URINE CULTURE: Organism ID, Bacteria: NO GROWTH

## 2019-11-17 NOTE — Progress Notes (Signed)
Acute Office Visit  Subjective:    Patient ID: Hannah [name], female    DOB: 1941/11/22, 78 y.o.   MRN: [id number]  Chief Complaint  Patient presents with  . Hyperlipidemia  . Hypertension  . Hypothyroidism    Hyperlipidemia This is a chronic problem. The current episode started more than 1 year ago. The problem is controlled. Recent lipid tests were reviewed and are normal. Exacerbating diseases include hypothyroidism. Pertinent negatives include no chest pain. The current treatment provides significant improvement of lipids. There are no compliance problems.  Risk factors for coronary artery disease include dyslipidemia and post-menopausal.  Hypertension This is a chronic problem. The current episode started more than 1 year ago. The problem is controlled. Pertinent negatives include no chest pain. Risk factors for coronary artery disease include dyslipidemia. Past treatments include ACE inhibitors and diuretics. The current treatment provides significant improvement. There are no compliance problems.     The patient does have recurrent urinary tract infections.  And we have discussed that we can check this anytime that she wants to.  She has used Pyridium in the past.  She also does have hypothyroidism that is can recheck today.  Otherwise she has been doing quite well.  No complaints  Past Medical History:  Diagnosis Date  . Cataract   . Hyperlipidemia   . Hypertension   . Thyroid disease     Past Surgical History:  Procedure Laterality Date  . ABDOMINAL HYSTERECTOMY    . APPENDECTOMY    . BLADDER SURGERY    . EYE SURGERY    . UMBILICAL HERNIA REPAIR N/A 06/22/2019   Procedure: HERNIA REPAIR UMBILICAL ADULT;  Surgeon: Franky Macho, MD;  Location: [facility];  Service: General;  Laterality: N/A;    Family History  Problem Relation Age of Onset  . Stroke Mother   . Heart disease Mother   . Heart disease Father   . Stroke Father   . Cancer Father   . Cancer Sister   .  Aneurysm Brother     Social History   Socioeconomic History  . Marital status: Widowed    Spouse name: Not on file  . Number of children: 1  . Years of education: 34  . Highest education level: Not on file  Occupational History  . Not on file  Tobacco Use  . Smoking status: Never Smoker  . Smokeless tobacco: Never Used  Substance and Sexual Activity  . Alcohol use: Never  . Drug use: Never  . Sexual activity: Not Currently  Other Topics Concern  . Not on file  Social History Narrative  . Not on file   Social Determinants of Health   Financial Resource Strain: Medium Risk  . Difficulty of Paying Living Expenses: Somewhat hard  Food Insecurity: No Food Insecurity  . Worried About Programme researcher, broadcasting/film/video in the Last Year: Never true  . Ran Out of Food in the Last Year: Never true  Transportation Needs: No Transportation Needs  . Lack of Transportation (Medical): No  . Lack of Transportation (Non-Medical): No  Physical Activity: Insufficiently Active  . Days of Exercise per Week: 3 days  . Minutes of Exercise per Session: 30 min  Stress: No Stress Concern Present  . Feeling of Stress : Not at all  Social Connections: Moderately Isolated  . Frequency of Communication with Friends and Family: Once a week  . Frequency of Social Gatherings with Friends and Family: Once a week  . Attends Religious Services:  1 to 4 times per year  . Active Member of Clubs or Organizations: No  . Attends Banker Meetings: Never  . Marital Status: Widowed  Intimate Partner Violence: Not At Risk  . Fear of Current or Ex-Partner: No  . Emotionally Abused: No  . Physically Abused: No  . Sexually Abused: No    Outpatient Medications Prior to Visit  Medication Sig Dispense Refill  . atorvastatin (LIPITOR) 10 MG tablet TAKE 1 TABLET BY MOUTH EVERY DAY 90 tablet 2  . Calcium Carb-Cholecalciferol 600-400 MG-UNIT TABS Take 1 tablet by mouth daily.     Marland Kitchen lisinopril-hydrochlorothiazide  (ZESTORETIC) 20-25 MG tablet Take 0.5 tablets by mouth daily.    . polyethylene glycol (MIRALAX / GLYCOLAX) 17 g packet Take 17 g by mouth daily as needed for mild constipation. 14 each 0  . SYNTHROID 75 MCG tablet Take 75 mcg by mouth every morning.     . ciprofloxacin (CIPRO) 500 MG tablet Take 1 tablet (500 mg total) by mouth 2 (two) times daily. (Patient not taking: Reported on 11/14/2019) 10 tablet 0  . phenazopyridine (PYRIDIUM) 100 MG tablet Take 1 tablet (100 mg total) by mouth 3 (three) times daily as needed for pain. (Patient not taking: Reported on 11/14/2019) 6 tablet 0   No facility-administered medications prior to visit.    Allergies  Allergen Reactions  . Penicillins Rash    Did it involve swelling of the face/tongue/throat, SOB, or low BP? Unknown Did it involve sudden or severe rash/hives, skin peeling, or any reaction on the inside of your mouth or nose? Ruta Hinds Did you need to seek medical attention at a hospital or doctor's office? No When did it last happen?childhood If all above answers are "NO", may proceed with cephalosporin use.   . Sulfa Antibiotics Rash    Review of Systems  Constitutional: Negative.  Negative for activity change, fatigue and fever.  HENT: Negative.   Eyes: Negative.   Respiratory: Negative.  Negative for cough.   Cardiovascular: Negative.  Negative for chest pain.  Gastrointestinal: Negative.  Negative for abdominal pain.  Endocrine: Negative.   Genitourinary: Negative.  Negative for dysuria.  Musculoskeletal: Negative.   Skin: Negative.   Neurological: Negative.        Objective:    Physical Exam Constitutional:      General: She is not in acute distress.    Appearance: Normal appearance. She is well-developed.  HENT:     Head: Normocephalic and atraumatic.  Cardiovascular:     Rate and Rhythm: Normal rate.  Pulmonary:     Effort: Pulmonary effort is normal.  Skin:    General: Skin is warm and dry.     Findings:  No rash.  Neurological:     Mental Status: She is alert and oriented to person, place, and time.     Deep Tendon Reflexes: Reflexes are normal and symmetric.     BP 135/77   Pulse 96   Temp (!) 97.5 F (36.4 C) (Temporal)   Ht 5\' 6"  (1.676 m)   Wt 174 lb (78.9 kg)   SpO2 95%   BMI 28.08 kg/m  Wt Readings from Last 3 Encounters:  11/14/19 174 lb (78.9 kg)  07/12/19 170 lb (77.1 kg)  07/04/19 170 lb 12.8 oz (77.5 kg)    Health Maintenance Due  Topic Date Due  . DEXA SCAN  02/01/2006    There are no preventive care reminders to display for this patient.   Lab  Results  Component Value Date   TSH 1.270 11/10/2019   Lab Results  Component Value Date   WBC 4.9 11/10/2019   HGB 14.1 11/10/2019   HCT 42.0 11/10/2019   MCV 85 11/10/2019   PLT 145 (L) 11/10/2019   Lab Results  Component Value Date   NA 142 11/10/2019   K 3.6 11/10/2019   CO2 24 11/10/2019   GLUCOSE 94 11/10/2019   BUN 17 11/10/2019   CREATININE 0.93 11/10/2019   BILITOT 0.4 11/10/2019   ALKPHOS 91 11/10/2019   AST 19 11/10/2019   ALT 13 11/10/2019   PROT 6.9 11/10/2019   ALBUMIN 4.4 11/10/2019   CALCIUM 9.3 11/10/2019   ANIONGAP 10 06/24/2019   Lab Results  Component Value Date   CHOL 136 11/10/2019   Lab Results  Component Value Date   HDL 44 11/10/2019   Lab Results  Component Value Date   LDLCALC 69 11/10/2019   Lab Results  Component Value Date   TRIG 127 11/10/2019   Lab Results  Component Value Date   CHOLHDL 3.1 11/10/2019   No results found for: HGBA1C     Assessment & Plan:   Problem List Items Addressed This Visit      Cardiovascular and Mediastinum   Essential hypertension   Relevant Orders   CBC with Differential/Platelet   CMP14+EGFR   Lipid Panel   TSH     Endocrine   Acquired hypothyroidism   Relevant Medications   SYNTHROID 75 MCG tablet   Other Relevant Orders   TSH     Other   Hyperlipidemia   Relevant Orders   Lipid Panel    Other Visit  Diagnoses    Dysuria    -  Primary   Relevant Orders   urinalysis- dip and micro (Completed)   Urine culture (Completed)   Microscopic Examination (Completed)       Meds ordered this encounter  Medications  . SYNTHROID 75 MCG tablet    Sig: Take 1 tablet (75 mcg total) by mouth every morning.    Dispense:  90 tablet    Refill:  3    Order Specific Question:   Supervising Provider    Answer:   Raliegh Ip [[id number]]     Remus Loffler, PA-C

## 2019-12-26 ENCOUNTER — Ambulatory Visit: Payer: Medicare Other | Admitting: Physician Assistant

## 2020-05-16 ENCOUNTER — Ambulatory Visit (INDEPENDENT_AMBULATORY_CARE_PROVIDER_SITE_OTHER): Payer: Medicare Other | Admitting: Family Medicine

## 2020-05-16 ENCOUNTER — Other Ambulatory Visit: Payer: Self-pay

## 2020-05-16 ENCOUNTER — Encounter: Payer: Self-pay | Admitting: Family Medicine

## 2020-05-16 ENCOUNTER — Ambulatory Visit: Payer: Medicare Other | Admitting: Physician Assistant

## 2020-05-16 VITALS — BP 130/76 | HR 92 | Temp 97.4°F | Ht 66.0 in | Wt 175.4 lb

## 2020-05-16 DIAGNOSIS — E7841 Elevated Lipoprotein(a): Secondary | ICD-10-CM | POA: Diagnosis not present

## 2020-05-16 DIAGNOSIS — E039 Hypothyroidism, unspecified: Secondary | ICD-10-CM

## 2020-05-16 DIAGNOSIS — D696 Thrombocytopenia, unspecified: Secondary | ICD-10-CM

## 2020-05-16 DIAGNOSIS — I10 Essential (primary) hypertension: Secondary | ICD-10-CM

## 2020-05-16 MED ORDER — SYNTHROID 75 MCG PO TABS: 75.0000 ug | ORAL_TABLET | Freq: Every day | ORAL | 3 refills | Status: DC

## 2020-05-16 MED ORDER — LISINOPRIL-HYDROCHLOROTHIAZIDE 20-25 MG PO TABS: 0.5000 | ORAL_TABLET | Freq: Every day | ORAL | 3 refills | Status: DC

## 2020-05-16 MED ORDER — ATORVASTATIN CALCIUM 10 MG PO TABS: 10.0000 mg | ORAL_TABLET | Freq: Every day | ORAL | 3 refills | Status: DC

## 2020-05-16 NOTE — Progress Notes (Signed)
 Assessment & Plan:  1. Essential hypertension - Well controlled on current regimen.  - lisinopril-hydrochlorothiazide (ZESTORETIC) 20-25 MG tablet; Take 0.5 tablets by mouth daily.  Dispense: 45 tablet; Refill: 3 - CMP14+EGFR - Lipid panel  2. Elevated lipoprotein(a) - Well controlled on current regimen.  - atorvastatin (LIPITOR) 10 MG tablet; Take 1 tablet (10 mg total) by mouth daily.  Dispense: 90 tablet; Refill: 3 - CMP14+EGFR - Lipid panel  3. Acquired hypothyroidism - Well controlled on current regimen.  - SYNTHROID 75 MCG tablet; Take 1 tablet (75 mcg total) by mouth daily before breakfast.  Dispense: 90 tablet; Refill: 3 - CMP14+EGFR - TSH  4. Thrombocytopenia (HCC) - CBC with Differential/Platelet   Return in about 6 months (around 11/15/2020) for annual physical.  Britney Joyce, MSN, APRN, FNP-C [facility]  Subjective:    Patient ID: Hannah [name], female    DOB: 10/27/1941, 79 y.o.   MRN: [id number]  Patient Care Team: Joyce, Britney F, FNP as PCP - General (Family Medicine)   Chief Complaint:  Chief Complaint  Patient presents with  . Establish Care    jones pt   . Medical Management of Chronic Issues    check up of chronic medical conditions    HPI: Hannah [name] is a 79 y.o. female presenting on 05/16/2020 for Establish Care (jones pt ) and Medical Management of Chronic Issues (check up of chronic medical conditions)  Patient here for a follow-up of hypertension, hyperlipidemia, and hypothyroidism.   She has no complaints or concerns today.  New complaints: None  Social history:  Relevant past medical, surgical, family and social history reviewed and updated as indicated. Interim medical history since our last visit reviewed.  Allergies and medications reviewed and updated.  DATA REVIEWED: CHART IN EPIC  ROS: Negative unless specifically indicated above in HPI.    Current Outpatient Medications:  .  atorvastatin  (LIPITOR) 10 MG tablet, TAKE 1 TABLET BY MOUTH EVERY DAY, Disp: 90 tablet, Rfl: 2 .  Calcium Carb-Cholecalciferol 600-400 MG-UNIT TABS, Take 1 tablet by mouth daily. , Disp: , Rfl:  .  lisinopril-hydrochlorothiazide (ZESTORETIC) 20-25 MG tablet, Take 0.5 tablets by mouth daily., Disp: , Rfl:  .  polyethylene glycol (MIRALAX / GLYCOLAX) 17 g packet, Take 17 g by mouth daily as needed for mild constipation., Disp: 14 each, Rfl: 0 .  SYNTHROID 75 MCG tablet, Take 1 tablet (75 mcg total) by mouth every morning., Disp: 90 tablet, Rfl: 3   Allergies  Allergen Reactions  . Penicillins Rash    Did it involve swelling of the face/tongue/throat, SOB, or low BP? Unknown Did it involve sudden or severe rash/hives, skin peeling, or any reaction on the inside of your mouth or nose? Unknown-rash Did you need to seek medical attention at a hospital or doctor's office? No When did it last happen?childhood If all above answers are "NO", may proceed with cephalosporin use.   . Sulfa Antibiotics Rash   Past Medical History:  Diagnosis Date  . Cataract   . Hyperlipidemia   . Hypertension   . Incarcerated umbilical hernia 06/21/2019  . Thyroid disease     Past Surgical History:  Procedure Laterality Date  . ABDOMINAL HYSTERECTOMY    . APPENDECTOMY    . BLADDER SURGERY    . EYE SURGERY    . HERNIA REPAIR    . UMBILICAL HERNIA REPAIR N/A 06/22/2019   Procedure: HERNIA REPAIR UMBILICAL ADULT;  Surgeon: Jenkins, Mark, MD;  Location: [facility]    Assessment & Plan:  1. Essential hypertension - Well controlled on current regimen.  - lisinopril-hydrochlorothiazide (ZESTORETIC) 20-25 MG tablet; Take 0.5 tablets by mouth daily.  Dispense: 45 tablet; Refill: 3 - CMP14+EGFR - Lipid panel  2. Elevated lipoprotein(a) - Well controlled on current regimen.  - atorvastatin (LIPITOR) 10 MG tablet; Take 1 tablet (10 mg total) by mouth daily.  Dispense: 90 tablet; Refill: 3 - CMP14+EGFR - Lipid panel  3. Acquired hypothyroidism - Well controlled on current regimen.  - SYNTHROID 75 MCG tablet; Take 1 tablet (75 mcg total) by mouth daily before breakfast.  Dispense: 90 tablet; Refill: 3 - CMP14+EGFR - TSH  4. Thrombocytopenia (HCC) - CBC with Differential/Platelet   Return in about 6 months (around 11/15/2020) for annual physical.  Hannah Pokorski, MSN, APRN, FNP-C [facility]  Subjective:    Patient ID: Hannah [name], female    DOB: 02/12/1941, 79 y.o.   MRN: [id number]  Patient Care Team: Abubakar Crispo F, FNP as PCP - General (Family Medicine)   Chief Complaint:  Chief Complaint  Patient presents with  . Establish Care    jones pt   . Medical Management of Chronic Issues    check up of chronic medical conditions    HPI: Hannah [name] is a 79 y.o. female presenting on 05/16/2020 for Establish Care (jones pt ) and Medical Management of Chronic Issues (check up of chronic medical conditions)  Patient here for a follow-up of hypertension, hyperlipidemia, and hypothyroidism.   She has no complaints or concerns today.  New complaints: None  Social history:  Relevant past medical, surgical, family and social history reviewed and updated as indicated. Interim medical history since our last visit reviewed.  Allergies and medications reviewed and updated.  DATA REVIEWED: CHART IN EPIC  ROS: Negative unless specifically indicated above in HPI.    Current Outpatient Medications:  .  atorvastatin  (LIPITOR) 10 MG tablet, TAKE 1 TABLET BY MOUTH EVERY DAY, Disp: 90 tablet, Rfl: 2 .  Calcium Carb-Cholecalciferol 600-400 MG-UNIT TABS, Take 1 tablet by mouth daily. , Disp: , Rfl:  .  lisinopril-hydrochlorothiazide (ZESTORETIC) 20-25 MG tablet, Take 0.5 tablets by mouth daily., Disp: , Rfl:  .  polyethylene glycol (MIRALAX / GLYCOLAX) 17 g packet, Take 17 g by mouth daily as needed for mild constipation., Disp: 14 each, Rfl: 0 .  SYNTHROID 75 MCG tablet, Take 1 tablet (75 mcg total) by mouth every morning., Disp: 90 tablet, Rfl: 3   Allergies  Allergen Reactions  . Penicillins Rash    Did it involve swelling of the face/tongue/throat, SOB, or low BP? Unknown Did it involve sudden or severe rash/hives, skin peeling, or any reaction on the inside of your mouth or nose? Unknown-rash Did you need to seek medical attention at a hospital or doctor's office? No When did it last happen?childhood If all above answers are "NO", may proceed with cephalosporin use.   . Sulfa Antibiotics Rash   Past Medical History:  Diagnosis Date  . Cataract   . Hyperlipidemia   . Hypertension   . Incarcerated umbilical hernia 06/21/2019  . Thyroid disease     Past Surgical History:  Procedure Laterality Date  . ABDOMINAL HYSTERECTOMY    . APPENDECTOMY    . BLADDER SURGERY    . EYE SURGERY    . HERNIA REPAIR    . UMBILICAL HERNIA REPAIR N/A 06/22/2019   Procedure: HERNIA REPAIR UMBILICAL ADULT;  Surgeon: Jenkins, Mark, MD;  Location: [facility]   Assessment & Plan:  1. Essential hypertension - Well controlled on current regimen.  - lisinopril-hydrochlorothiazide (ZESTORETIC) 20-25 MG tablet; Take 0.5 tablets by mouth daily.  Dispense: 45 tablet; Refill: 3 - CMP14+EGFR - Lipid panel  2. Elevated lipoprotein(a) - Well controlled on current regimen.  - atorvastatin (LIPITOR) 10 MG tablet; Take 1 tablet (10 mg total) by mouth daily.  Dispense: 90 tablet; Refill: 3 - CMP14+EGFR - Lipid panel  3. Acquired hypothyroidism - Well controlled on current regimen.  - SYNTHROID 75 MCG tablet; Take 1 tablet (75 mcg total) by mouth daily before breakfast.  Dispense: 90 tablet; Refill: 3 - CMP14+EGFR - TSH  4. Thrombocytopenia ([place]) - CBC with Differential/Platelet   Return in about 6 months (around 11/15/2020) for annual physical.  Hendricks Limes, MSN, APRN, FNP-C [facility]  Subjective:    Patient ID: Hannah [name], female    DOB: 18-Mar-1941, 79 y.o.   MRN: [id number]  Patient Care Team: Loman Brooklyn, FNP as PCP - General (Family Medicine)   Chief Complaint:  Chief Complaint  Patient presents with  . Establish Care    jones pt   . Medical Management of Chronic Issues    check up of chronic medical conditions    HPI: Hannah [name] is a 79 y.o. female presenting on 05/16/2020 for Establish Care (jones pt ) and Medical Management of Chronic Issues (check up of chronic medical conditions)  Patient here for a follow-up of hypertension, hyperlipidemia, and hypothyroidism.   She has no complaints or concerns today.  New complaints: None  Social history:  Relevant past medical, surgical, family and social history reviewed and updated as indicated. Interim medical history since our last visit reviewed.  Allergies and medications reviewed and updated.  DATA REVIEWED: CHART IN EPIC  ROS: Negative unless specifically indicated above in HPI.    Current Outpatient Medications:  .  atorvastatin  (LIPITOR) 10 MG tablet, TAKE 1 TABLET BY MOUTH EVERY DAY, Disp: 90 tablet, Rfl: 2 .  Calcium Carb-Cholecalciferol 600-400 MG-UNIT TABS, Take 1 tablet by mouth daily. , Disp: , Rfl:  .  lisinopril-hydrochlorothiazide (ZESTORETIC) 20-25 MG tablet, Take 0.5 tablets by mouth daily., Disp: , Rfl:  .  polyethylene glycol (MIRALAX / GLYCOLAX) 17 g packet, Take 17 g by mouth daily as needed for mild constipation., Disp: 14 each, Rfl: 0 .  SYNTHROID 75 MCG tablet, Take 1 tablet (75 mcg total) by mouth every morning., Disp: 90 tablet, Rfl: 3   Allergies  Allergen Reactions  . Penicillins Rash    Did it involve swelling of the face/tongue/throat, SOB, or low BP? Unknown Did it involve sudden or severe rash/hives, skin peeling, or any reaction on the inside of your mouth or nose? Janifer Adie Did you need to seek medical attention at a hospital or doctor's office? No When did it last happen?childhood If all above answers are "NO", may proceed with cephalosporin use.   . Sulfa Antibiotics Rash   Past Medical History:  Diagnosis Date  . Cataract   . Hyperlipidemia   . Hypertension   . Incarcerated umbilical hernia 03/09/8118  . Thyroid disease     Past Surgical History:  Procedure Laterality Date  . ABDOMINAL HYSTERECTOMY    . APPENDECTOMY    . BLADDER SURGERY    . EYE SURGERY    . HERNIA REPAIR    . UMBILICAL HERNIA REPAIR N/A 06/22/2019   Procedure: HERNIA REPAIR UMBILICAL ADULT;  Surgeon: Aviva Signs, MD;  Location: [facility]

## 2020-05-17 LAB — LIPID PANEL
Chol/HDL Ratio: 3 ratio (ref 0.0–4.4)
Cholesterol, Total: 131 mg/dL (ref 100–199)
HDL: 43 mg/dL (ref >39)
LDL Chol Calc (NIH): 64 mg/dL (ref 0–99)
Triglycerides: 140 mg/dL (ref 0–149)
VLDL Cholesterol Cal: 24 mg/dL (ref 5–40)

## 2020-05-17 LAB — CBC WITH DIFFERENTIAL/PLATELET
Basophils Absolute: 0 10*3/uL (ref 0.0–0.2)
Basos: 1 %
EOS (ABSOLUTE): 0.1 10*3/uL (ref 0.0–0.4)
Eos: 2 %
Hematocrit: 43.8 % (ref 34.0–46.6)
Hemoglobin: 14.2 g/dL (ref 11.1–15.9)
Immature Grans (Abs): 0 10*3/uL (ref 0.0–0.1)
Immature Granulocytes: 0 %
Lymphocytes Absolute: 1.1 10*3/uL (ref 0.7–3.1)
Lymphs: 20 %
MCH: 28.3 pg (ref 26.6–33.0)
MCHC: 32.4 g/dL (ref 31.5–35.7)
MCV: 87 fL (ref 79–97)
Monocytes Absolute: 0.4 10*3/uL (ref 0.1–0.9)
Monocytes: 7 %
Neutrophils Absolute: 3.6 10*3/uL (ref 1.4–7.0)
Neutrophils: 70 %
Platelets: 148 10*3/uL — ABNORMAL LOW (ref 150–450)
RBC: 5.01 x10E6/uL (ref 3.77–5.28)
RDW: 13.5 % (ref 11.7–15.4)
WBC: 5.2 10*3/uL (ref 3.4–10.8)

## 2020-05-17 LAB — CMP14+EGFR
ALT: 15 IU/L (ref 0–32)
AST: 19 IU/L (ref 0–40)
Albumin/Globulin Ratio: 1.3 (ref 1.2–2.2)
Albumin: 4.2 g/dL (ref 3.7–4.7)
Alkaline Phosphatase: 88 IU/L (ref 48–121)
BUN/Creatinine Ratio: 17 (ref 12–28)
BUN: 15 mg/dL (ref 8–27)
Bilirubin Total: 0.4 mg/dL (ref 0.0–1.2)
CO2: 25 mmol/L (ref 20–29)
Calcium: 9.4 mg/dL (ref 8.7–10.3)
Chloride: 103 mmol/L (ref 96–106)
Creatinine, Ser: 0.89 mg/dL (ref 0.57–1.00)
GFR calc Af Amer: 71 mL/min/{1.73_m2} (ref >59)
GFR calc non Af Amer: 62 mL/min/{1.73_m2} (ref >59)
Globulin, Total: 3.2 g/dL (ref 1.5–4.5)
Glucose: 97 mg/dL (ref 65–99)
Potassium: 3.7 mmol/L (ref 3.5–5.2)
Sodium: 143 mmol/L (ref 134–144)
Total Protein: 7.4 g/dL (ref 6.0–8.5)

## 2020-05-17 LAB — TSH: TSH: 1.48 u[IU]/mL (ref 0.450–4.500)

## 2020-09-07 ENCOUNTER — Other Ambulatory Visit: Payer: Self-pay

## 2020-09-07 ENCOUNTER — Ambulatory Visit (INDEPENDENT_AMBULATORY_CARE_PROVIDER_SITE_OTHER): Payer: Medicare Other

## 2020-09-07 DIAGNOSIS — Z23 Encounter for immunization: Secondary | ICD-10-CM | POA: Diagnosis not present

## 2020-10-10 DIAGNOSIS — H2513 Age-related nuclear cataract, bilateral: Secondary | ICD-10-CM | POA: Diagnosis not present

## 2020-10-19 ENCOUNTER — Ambulatory Visit (INDEPENDENT_AMBULATORY_CARE_PROVIDER_SITE_OTHER): Payer: Medicare Other | Admitting: Family Medicine

## 2020-10-19 ENCOUNTER — Other Ambulatory Visit: Payer: Self-pay

## 2020-10-19 ENCOUNTER — Encounter: Payer: Self-pay | Admitting: Family Medicine

## 2020-10-19 VITALS — BP 135/68 | HR 89 | Temp 98.0°F | Ht 66.0 in | Wt 175.2 lb

## 2020-10-19 DIAGNOSIS — R399 Unspecified symptoms and signs involving the genitourinary system: Secondary | ICD-10-CM

## 2020-10-19 DIAGNOSIS — N3001 Acute cystitis with hematuria: Secondary | ICD-10-CM | POA: Diagnosis not present

## 2020-10-19 LAB — URINALYSIS, COMPLETE
Bilirubin, UA: NEGATIVE
Glucose, UA: NEGATIVE
Ketones, UA: NEGATIVE
Nitrite, UA: POSITIVE — AB
Specific Gravity, UA: 1.015 (ref 1.005–1.030)
Urobilinogen, Ur: 0.2 mg/dL (ref 0.2–1.0)
pH, UA: 7 (ref 5.0–7.5)

## 2020-10-19 LAB — MICROSCOPIC EXAMINATION: WBC, UA: >30 /hpf — AB (ref 0–5)

## 2020-10-19 MED ORDER — PHENAZOPYRIDINE HCL 100 MG PO TABS: 100.0000 mg | ORAL_TABLET | Freq: Three times a day (TID) | ORAL | 0 refills | Status: DC | PRN

## 2020-10-19 MED ORDER — CEPHALEXIN 500 MG PO CAPS: 500.0000 mg | ORAL_CAPSULE | Freq: Two times a day (BID) | ORAL | 0 refills | Status: AC

## 2020-10-19 NOTE — Patient Instructions (Signed)

## 2020-10-19 NOTE — Progress Notes (Signed)
LMB:EMLJQ, Shireen Quan, FNP Chief Complaint  Patient presents with  . Dysuria    Current Issues:  Presents with 2 days of dysuria. Associated symptoms include:  lower abdominal pain, urinary frequency, urinary hesitancy, urinary urgency and lower back pain  Denies: fever, chills, flank pain, nausea, vomiting.   There is a previous history of of similar symptoms. No concern for STI.  Prior to Admission medications   Medication Sig Start Date End Date Taking? Authorizing Provider  atorvastatin (LIPITOR) 10 MG tablet Take 1 tablet (10 mg total) by mouth daily. 05/16/20  Yes Hendricks Limes F, FNP  Calcium Carb-Cholecalciferol 600-400 MG-UNIT TABS Take 1 tablet by mouth daily.    Yes [provider]  lisinopril-hydrochlorothiazide (ZESTORETIC) 20-25 MG tablet Take 0.5 tablets by mouth daily. 05/16/20  Yes Loman Brooklyn, FNP  SYNTHROID 75 MCG tablet Take 1 tablet (75 mcg total) by mouth daily before breakfast. 05/16/20  Yes Loman Brooklyn, FNP    Review of Systems Per HPI.  Objective BP 135/68   Pulse 89   Temp 98 F (36.7 C) (Temporal)   Ht 5' 6" (1.676 m)   Wt 175 lb 4 oz (79.5 kg)   BMI 28.29 kg/m  Physical Exam Vitals and nursing note reviewed.  Constitutional:      General: She is not in acute distress.    Appearance: Normal appearance. She is not ill-appearing, toxic-appearing or diaphoretic.  Cardiovascular:     Rate and Rhythm: Normal rate and regular rhythm.     Heart sounds: Normal heart sounds. No murmur heard.   Pulmonary:     Effort: Pulmonary effort is normal. No respiratory distress.     Breath sounds: Normal breath sounds.  Abdominal:     General: Bowel sounds are normal. There is no distension.     Palpations: Abdomen is soft. There is no mass.     Tenderness: There is no abdominal tenderness. There is no right CVA tenderness, left CVA tenderness, guarding or rebound.     Hernia: No hernia is present.  Skin:    General: Skin is warm and  dry.  Neurological:     General: No focal deficit present.     Mental Status: She is alert and oriented to person, place, and time.     Motor: No weakness.  Psychiatric:        Mood and Affect: Mood normal.        Behavior: Behavior normal.    Urine dipstick shows positive for RBC's, positive for protein, positive for nitrates and positive for leukocytes.  Micro exam: >30 WBC's per HPF, 3-10 RBC's per HPF and many bacteria.   Results for orders placed or performed in visit on 05/16/20  CBC with Differential/Platelet  Result Value Ref Range   WBC 5.2 3.4 - 10.8 x10E3/uL   RBC 5.01 3.77 - 5.28 x10E6/uL   Hemoglobin 14.2 11.1 - 15.9 g/dL   Hematocrit 43.8 34.0 - 46.6 %   MCV 87 79 - 97 fL   MCH 28.3 26.6 - 33.0 pg   MCHC 32.4 31 - 35 g/dL   RDW 13.5 11.7 - 15.4 %   Platelets 148 (L) 150 - 450 x10E3/uL   Neutrophils 70 Not Estab. %   Lymphs 20 Not Estab. %   Monocytes 7 Not Estab. %   Eos 2 Not Estab. %   Basos 1 Not Estab. %   Neutrophils Absolute 3.6 1.40 - 7.00 x10E3/uL   Lymphocytes Absolute 1.1  0 - 3 x10E3/uL   Monocytes Absolute 0.4 0 - 0 x10E3/uL   EOS (ABSOLUTE) 0.1 0.0 - 0.4 x10E3/uL   Basophils Absolute 0.0 0 - 0 x10E3/uL   Immature Granulocytes 0 Not Estab. %   Immature Grans (Abs) 0.0 0.0 - 0.1 x10E3/uL  CMP14+EGFR  Result Value Ref Range   Glucose 97 65 - 99 mg/dL   BUN 15 8 - 27 mg/dL   Creatinine, Ser 0.89 0.57 - 1.00 mg/dL   GFR calc non Af Amer 62 >59 mL/min/1.73   GFR calc Af Amer 71 >59 mL/min/1.73   BUN/Creatinine Ratio 17 12 - 28   Sodium 143 134 - 144 mmol/L   Potassium 3.7 3.5 - 5.2 mmol/L   Chloride 103 96 - 106 mmol/L   CO2 25 20 - 29 mmol/L   Calcium 9.4 8.7 - 10.3 mg/dL   Total Protein 7.4 6.0 - 8.5 g/dL   Albumin 4.2 3.7 - 4.7 g/dL   Globulin, Total 3.2 1.5 - 4.5 g/dL   Albumin/Globulin Ratio 1.3 1.2 - 2.2   Bilirubin Total 0.4 0.0 - 1.2 mg/dL   Alkaline Phosphatase 88 48 - 121 IU/L   AST 19 0 - 40 IU/L   ALT 15 0 - 32 IU/L  Lipid  panel  Result Value Ref Range   Cholesterol, Total 131 100 - 199 mg/dL   Triglycerides 140 0 - 149 mg/dL   HDL 43 >39 mg/dL   VLDL Cholesterol Cal 24 5 - 40 mg/dL   LDL Chol Calc (NIH) 64 0 - 99 mg/dL   Chol/HDL Ratio 3.0 0.0 - 4.4 ratio  TSH  Result Value Ref Range   TSH 1.480 0.450 - 4.500 uIU/mL    Assessment and Plan:   Kellen was seen today for dysuria.  Diagnoses and all orders for this visit:  UTI symptoms -     Urinalysis, Complete -     Urine Culture  Acute cystitis with hematuria UA indicated UTI. Culture pending. She has tolerated Keflex in the recent past. Return to office for new or worsening symptoms, or if symptoms persist.  -     cephALEXin (KEFLEX) 500 MG capsule; Take 1 capsule (500 mg total) by mouth 2 (two) times daily for 7 days. -     phenazopyridine (PYRIDIUM) 100 MG tablet; Take 1 tablet (100 mg total) by mouth 3 (three) times daily as needed for pain.  Follow up as needed. Keep scheduled appointment with PCP.  The patient indicates understanding of these issues and agrees with the plan.  Marjorie Smolder, FNP-C [place]

## 2020-10-23 LAB — URINE CULTURE

## 2020-10-24 ENCOUNTER — Encounter: Payer: Self-pay | Admitting: *Deleted

## 2020-11-28 ENCOUNTER — Encounter: Payer: Medicare Other | Admitting: Family Medicine

## 2020-12-19 ENCOUNTER — Encounter: Payer: Self-pay | Admitting: Family Medicine

## 2020-12-19 ENCOUNTER — Ambulatory Visit (INDEPENDENT_AMBULATORY_CARE_PROVIDER_SITE_OTHER): Payer: Medicare Other | Admitting: Family Medicine

## 2020-12-19 ENCOUNTER — Other Ambulatory Visit: Payer: Self-pay

## 2020-12-19 VITALS — BP 139/73 | HR 82 | Temp 98.5°F | Ht 66.0 in | Wt 174.8 lb

## 2020-12-19 DIAGNOSIS — E7841 Elevated Lipoprotein(a): Secondary | ICD-10-CM | POA: Diagnosis not present

## 2020-12-19 DIAGNOSIS — I10 Essential (primary) hypertension: Secondary | ICD-10-CM | POA: Diagnosis not present

## 2020-12-19 DIAGNOSIS — Z0001 Encounter for general adult medical examination with abnormal findings: Secondary | ICD-10-CM

## 2020-12-19 DIAGNOSIS — E039 Hypothyroidism, unspecified: Secondary | ICD-10-CM

## 2020-12-19 DIAGNOSIS — Z0000 Encounter for general adult medical examination without abnormal findings: Secondary | ICD-10-CM

## 2020-12-19 MED ORDER — LISINOPRIL-HYDROCHLOROTHIAZIDE 20-25 MG PO TABS: 0.5000 | ORAL_TABLET | Freq: Every day | ORAL | 3 refills | Status: DC

## 2020-12-19 MED ORDER — LEVOTHYROXINE SODIUM 75 MCG PO TABS: 75.0000 ug | ORAL_TABLET | Freq: Every day | ORAL | 2 refills | Status: DC

## 2020-12-19 MED ORDER — ATORVASTATIN CALCIUM 10 MG PO TABS: 10.0000 mg | ORAL_TABLET | Freq: Every day | ORAL | 3 refills | Status: DC

## 2020-12-19 NOTE — Patient Instructions (Signed)
Preventive Care 61 Years and Older, Female Preventive care refers to lifestyle choices and visits with your health care provider that can promote health and wellness. This includes:  A yearly physical exam. This is also called an annual wellness visit.  Regular dental and eye exams.  Immunizations.  Screening for certain conditions.  Healthy lifestyle choices, such as: ? Eating a healthy diet. ? Getting regular exercise. ? Not using drugs or products that contain nicotine and tobacco. ? Limiting alcohol use. What can I expect for my preventive care visit? Physical exam Your health care provider will check your:  Height and weight. These may be used to calculate your BMI (body mass index). BMI is a measurement that tells if you are at a healthy weight.  Heart rate and blood pressure.  Body temperature.  Skin for abnormal spots. Counseling Your health care provider may ask you questions about your:  Past medical problems.  Family's medical history.  Alcohol, tobacco, and drug use.  Emotional well-being.  Home life and relationship well-being.  Sexual activity.  Diet, exercise, and sleep habits.  History of falls.  Memory and ability to understand (cognition).  Work and work Statistician.  Pregnancy and menstrual history.  Access to firearms. What immunizations do I need? Vaccines are usually given at various ages, according to a schedule. Your health care provider will recommend vaccines for you based on your age, medical history, and lifestyle or other factors, such as travel or where you work.   What tests do I need? Blood tests  Lipid and cholesterol levels. These may be checked every 5 years, or more often depending on your overall health.  Hepatitis C test.  Hepatitis B test. Screening  Lung cancer screening. You may have this screening every year starting at age 80 if you have a 30-pack-year history of smoking and currently smoke or have quit within  the past 15 years.  Colorectal cancer screening. ? All adults should have this screening starting at age 44 and continuing until age 58. ? Your health care provider may recommend screening at age 2 if you are at increased risk. ? You will have tests every 1-10 years, depending on your results and the type of screening test.  Diabetes screening. ? This is done by checking your blood sugar (glucose) after you have not eaten for a while (fasting). ? You may have this done every 1-3 years.  Mammogram. ? This may be done every 1-2 years. ? Talk with your health care provider about how often you should have regular mammograms.  Abdominal aortic aneurysm (AAA) screening. You may need this if you are a current or former smoker.  BRCA-related cancer screening. This may be done if you have a family history of breast, ovarian, tubal, or peritoneal cancers. Other tests  STD (sexually transmitted disease) testing, if you are at risk.  Bone density scan. This is done to screen for osteoporosis. You may have this done starting at age 80. Talk with your health care provider about your test results, treatment options, and if necessary, the need for more tests. Follow these instructions at home: Eating and drinking  Eat a diet that includes fresh fruits and vegetables, whole grains, lean protein, and low-fat dairy products. Limit your intake of foods with high amounts of sugar, saturated fats, and salt.  Take vitamin and mineral supplements as recommended by your health care provider.  Do not drink alcohol if your health care provider tells you not to drink.  If you drink alcohol: ? Limit how much you have to 0-1 drink a day. ? Be aware of how much alcohol is in your drink. In the [place] one drink equals one 12 oz bottle of beer (355 mL), one 5 oz glass of wine (148 mL), or one 1 oz glass of hard liquor (44 mL).   Lifestyle  Take daily care of your teeth and gums. Brush your teeth every morning  and night with fluoride toothpaste. Floss one time each day.  Stay active. Exercise for at least 30 minutes 5 or more days each week.  Do not use any products that contain nicotine or tobacco, such as cigarettes, e-cigarettes, and chewing tobacco. If you need help quitting, ask your health care provider.  Do not use drugs.  If you are sexually active, practice safe sex. Use a condom or other form of protection in order to prevent STIs (sexually transmitted infections).  Talk with your health care provider about taking a low-dose aspirin or statin.  Find healthy ways to cope with stress, such as: ? Meditation, yoga, or listening to music. ? Journaling. ? Talking to a trusted person. ? Spending time with friends and family. Safety  Always wear your seat belt while driving or riding in a vehicle.  Do not drive: ? If you have been drinking alcohol. Do not ride with someone who has been drinking. ? When you are tired or distracted. ? While texting.  Wear a helmet and other protective equipment during sports activities.  If you have firearms in your house, make sure you follow all gun safety procedures. What's next?  Visit your health care provider once a year for an annual wellness visit.  Ask your health care provider how often you should have your eyes and teeth checked.  Stay up to date on all vaccines. This information is not intended to replace advice given to you by your health care provider. Make sure you discuss any questions you have with your health care provider. Document Revised: 11/07/2020 Document Reviewed: 11/11/2018 Elsevier Patient Education  2021 [employer]  

## 2020-12-19 NOTE — Progress Notes (Signed)
Assessment & Plan:  1. Well adult exam - Preventive health education provided.  Patient declined hepatitis C screening, DEXA scan, and Shingrix.  She is up-to-date with Tdap, pneumonia, influenza, and COVID-19 vaccines.  2. Acquired hypothyroidism - Well controlled on current regimen.  Patient requesting prescription be changed from brand Synthroid to generic levothyroxine due to cost.  She currently has a 34-month supply of Synthroid.  Discussed we would need to recheck her thyroid labs 6 to 8 weeks after starting the levothyroxine. - levothyroxine (SYNTHROID) 75 MCG tablet; Take 1 tablet (75 mcg total) by mouth daily.  Dispense: 30 tablet; Refill: 2 - Thyroid Panel With TSH  3. Elevated lipoprotein(a) - Well controlled on current regimen.  Continue atorvastatin. - atorvastatin (LIPITOR) 10 MG tablet; Take 1 tablet (10 mg total) by mouth daily.  Dispense: 90 tablet; Refill: 3 - CMP14+EGFR - Lipid panel  4. Essential hypertension - Well controlled on current regimen.  - lisinopril-hydrochlorothiazide (ZESTORETIC) 20-25 MG tablet; Take 0.5 tablets by mouth daily.  Dispense: 45 tablet; Refill: 3 - CBC with Differential/Platelet - CMP14+EGFR - Lipid panel   Follow-up: Return in about 5 months (around 05/19/2021) for follow-up of chronic medication conditions.   [name], MSN, APRN, FNP-C [facility]  Subjective:  Patient ID: Hannah [name], female    DOB: [birth year]  Age: 80 y.o. MRN: [id number]  Patient Care Team: Gwenlyn Fudge, FNP as PCP - General (Family Medicine)   CC:  Chief Complaint  Patient presents with  . Annual Exam    HPI Hannah [name] presents for her annual physical.   Occupation: retired, Marital status: widowed, Substance use: none Diet: eats healthy, low salt, Exercise: none Last eye exam: November 2021 - got a new glasses prescription, has cataracts Last dental exam: a couple of months ago DEXA: declined Lung Cancer  Screening with low-dose Chest CT: N/A Hepatitis C Screening: declined Immunizations: Flu Vaccine: up to date Tdap Vaccine: up to date  Shingrix Vaccine: declined  COVID-19 Vaccine: up to date Pneumonia Vaccine: up to date  Advanced Directives Patient does not have advanced directives and does not desire to have any as her daughter knows all of her wishes.  DEPRESSION SCREENING PHQ 2/9 Scores 12/19/2020 10/19/2020 05/16/2020 11/14/2019 10/24/2019 07/04/2019 06/14/2019  PHQ - 2 Score 0 0 0 0 0 0 0     Hypertension: Takes medication as prescribed.  Eats a low-salt diet.  Does not exercise.  Hyperlipidemia: Taking atorvastatin daily and tolerating it well.  Hypothyroidism: Taking Synthroid 75 mcg daily as prescribed.  She is requesting the cheaper prescription of levothyroxine due to cost.   Review of Systems  Constitutional: Negative for chills, fever, malaise/fatigue and weight loss.  HENT: Negative for congestion, ear discharge, ear pain, nosebleeds, sinus pain, sore throat and tinnitus.   Eyes: Negative for blurred vision, double vision, pain, discharge and redness.  Respiratory: Negative for cough, shortness of breath and wheezing.   Cardiovascular: Negative for chest pain, palpitations and leg swelling.  Gastrointestinal: Negative for abdominal pain, constipation, diarrhea, heartburn, nausea and vomiting.  Genitourinary: Negative for dysuria, frequency and urgency.  Musculoskeletal: Negative for myalgias.  Skin: Negative for rash.  Neurological: Negative for dizziness, seizures, weakness and headaches.  Psychiatric/Behavioral: Negative for depression, substance abuse and suicidal ideas. The patient is not nervous/anxious.      Current Outpatient Medications:  .  atorvastatin (LIPITOR) 10 MG tablet, Take 1 tablet (10 mg total) by mouth daily., Disp: 90 tablet, Rfl: 3 .  Calcium Carb-Cholecalciferol 600-400 MG-UNIT TABS, Take 1 tablet by mouth daily. , Disp: , Rfl:  .   lisinopril-hydrochlorothiazide (ZESTORETIC) 20-25 MG tablet, Take 0.5 tablets by mouth daily., Disp: 45 tablet, Rfl: 3 .  SYNTHROID 75 MCG tablet, Take 1 tablet (75 mcg total) by mouth daily before breakfast., Disp: 90 tablet, Rfl: 3  Allergies  Allergen Reactions  . Penicillins Rash    Did it involve swelling of the face/tongue/throat, SOB, or low BP? Unknown Did it involve sudden or severe rash/hives, skin peeling, or any reaction on the inside of your mouth or nose? Ruta Hinds Did you need to seek medical attention at a hospital or doctor's office? No When did it last happen?childhood If all above answers are "NO", may proceed with cephalosporin use.   . Sulfa Antibiotics Rash    Past Medical History:  Diagnosis Date  . Cataract   . Hyperlipidemia   . Hypertension   . Incarcerated umbilical hernia 06/21/2019  . Thyroid disease     Past Surgical History:  Procedure Laterality Date  . ABDOMINAL HYSTERECTOMY    . APPENDECTOMY    . BLADDER SURGERY    . EYE SURGERY    . HERNIA REPAIR    . UMBILICAL HERNIA REPAIR N/A 06/22/2019   Procedure: HERNIA REPAIR UMBILICAL ADULT;  Surgeon: Franky Macho, MD;  Location: [facility];  Service: General;  Laterality: N/A;    Family History  Problem Relation Age of Onset  . Stroke Mother   . Heart disease Mother   . Heart disease Father   . Stroke Father   . Cancer Father   . Cancer Sister   . Aneurysm Brother     Social History   Socioeconomic History  . Marital status: Widowed    Spouse name: Not on file  . Number of children: 1  . Years of education: 51  . Highest education level: Not on file  Occupational History  . Not on file  Tobacco Use  . Smoking status: Never Smoker  . Smokeless tobacco: Never Used  Vaping Use  . Vaping Use: Never used  Substance and Sexual Activity  . Alcohol use: Never  . Drug use: Never  . Sexual activity: Not Currently  Other Topics Concern  . Not on file  Social History Narrative  . Not  on file   Social Determinants of Health   Financial Resource Strain: Not on file  Food Insecurity: Not on file  Transportation Needs: Not on file  Physical Activity: Not on file  Stress: Not on file  Social Connections: Not on file  Intimate Partner Violence: Not on file      Objective:    BP 139/73   Pulse 82   Temp 98.5 F (36.9 C) (Temporal)   Ht 5\' 6"  (1.676 m)   Wt 174 lb 12.8 oz (79.3 kg)   SpO2 98%   BMI 28.21 kg/m   Wt Readings from Last 3 Encounters:  12/19/20 174 lb 12.8 oz (79.3 kg)  10/19/20 175 lb 4 oz (79.5 kg)  05/16/20 175 lb 6.4 oz (79.6 kg)    Physical Exam Vitals reviewed.  Constitutional:      General: She is not in acute distress.    Appearance: Normal appearance. She is overweight. She is not ill-appearing, toxic-appearing or diaphoretic.  HENT:     Head: Normocephalic and atraumatic.     Right Ear: Tympanic membrane, ear canal and external ear normal. There is no impacted cerumen.  Left Ear: Tympanic membrane, ear canal and external ear normal. There is no impacted cerumen.     Nose: Nose normal. No congestion or rhinorrhea.     Mouth/Throat:     Mouth: Mucous membranes are moist.     Pharynx: Oropharynx is clear. No oropharyngeal exudate or posterior oropharyngeal erythema.  Eyes:     General: No scleral icterus.       Right eye: No discharge.        Left eye: No discharge.     Conjunctiva/sclera: Conjunctivae normal.     Pupils: Pupils are equal, round, and reactive to light.     Comments: Exotropia of left eye.  Cardiovascular:     Rate and Rhythm: Normal rate and regular rhythm.     Heart sounds: Normal heart sounds. No murmur heard. No friction rub. No gallop.   Pulmonary:     Effort: Pulmonary effort is normal. No respiratory distress.     Breath sounds: Normal breath sounds. No stridor. No wheezing, rhonchi or rales.  Abdominal:     General: Abdomen is flat. Bowel sounds are normal. There is no distension.     Palpations:  Abdomen is soft. There is no mass.     Tenderness: There is no abdominal tenderness. There is no guarding or rebound.     Hernia: No hernia is present.  Musculoskeletal:        General: Normal range of motion.     Cervical back: Normal range of motion and neck supple. No rigidity. No muscular tenderness.  Lymphadenopathy:     Cervical: No cervical adenopathy.  Skin:    General: Skin is warm and dry.     Capillary Refill: Capillary refill takes less than 2 seconds.  Neurological:     General: No focal deficit present.     Mental Status: She is alert and oriented to person, place, and time. Mental status is at baseline.  Psychiatric:        Mood and Affect: Mood normal.        Behavior: Behavior normal.        Thought Content: Thought content normal.        Judgment: Judgment normal.     Lab Results  Component Value Date   TSH 1.480 05/16/2020   Lab Results  Component Value Date   WBC 5.2 05/16/2020   HGB 14.2 05/16/2020   HCT 43.8 05/16/2020   MCV 87 05/16/2020   PLT 148 (L) 05/16/2020   Lab Results  Component Value Date   NA 143 05/16/2020   K 3.7 05/16/2020   CO2 25 05/16/2020   GLUCOSE 97 05/16/2020   BUN 15 05/16/2020   CREATININE 0.89 05/16/2020   BILITOT 0.4 05/16/2020   ALKPHOS 88 05/16/2020   AST 19 05/16/2020   ALT 15 05/16/2020   PROT 7.4 05/16/2020   ALBUMIN 4.2 05/16/2020   CALCIUM 9.4 05/16/2020   ANIONGAP 10 06/24/2019   Lab Results  Component Value Date   CHOL 131 05/16/2020   Lab Results  Component Value Date   HDL 43 05/16/2020   Lab Results  Component Value Date   LDLCALC 64 05/16/2020   Lab Results  Component Value Date   TRIG 140 05/16/2020   Lab Results  Component Value Date   CHOLHDL 3.0 05/16/2020   No results found for: HGBA1C

## 2020-12-20 ENCOUNTER — Encounter: Payer: Self-pay | Admitting: Family Medicine

## 2020-12-20 LAB — CBC WITH DIFFERENTIAL/PLATELET
Basophils Absolute: 0 10*3/uL (ref 0.0–0.2)
Basos: 0 %
EOS (ABSOLUTE): 0.1 10*3/uL (ref 0.0–0.4)
Eos: 1 %
Hematocrit: 43.8 % (ref 34.0–46.6)
Hemoglobin: 14.4 g/dL (ref 11.1–15.9)
Immature Grans (Abs): 0 10*3/uL (ref 0.0–0.1)
Immature Granulocytes: 0 %
Lymphocytes Absolute: 1.1 10*3/uL (ref 0.7–3.1)
Lymphs: 21 %
MCH: 28.6 pg (ref 26.6–33.0)
MCHC: 32.9 g/dL (ref 31.5–35.7)
MCV: 87 fL (ref 79–97)
Monocytes Absolute: 0.4 10*3/uL (ref 0.1–0.9)
Monocytes: 8 %
Neutrophils Absolute: 3.6 10*3/uL (ref 1.4–7.0)
Neutrophils: 70 %
Platelets: 137 10*3/uL — ABNORMAL LOW (ref 150–450)
RBC: 5.04 x10E6/uL (ref 3.77–5.28)
RDW: 13.5 % (ref 11.7–15.4)
WBC: 5.3 10*3/uL (ref 3.4–10.8)

## 2020-12-20 LAB — LIPID PANEL
Chol/HDL Ratio: 3.1 ratio (ref 0.0–4.4)
Cholesterol, Total: 135 mg/dL (ref 100–199)
HDL: 44 mg/dL (ref >39)
LDL Chol Calc (NIH): 69 mg/dL (ref 0–99)
Triglycerides: 125 mg/dL (ref 0–149)
VLDL Cholesterol Cal: 22 mg/dL (ref 5–40)

## 2020-12-20 LAB — CMP14+EGFR
ALT: 13 IU/L (ref 0–32)
AST: 22 IU/L (ref 0–40)
Albumin/Globulin Ratio: 1.5 (ref 1.2–2.2)
Albumin: 4.3 g/dL (ref 3.7–4.7)
Alkaline Phosphatase: 86 IU/L (ref 44–121)
BUN/Creatinine Ratio: 19 (ref 12–28)
BUN: 17 mg/dL (ref 8–27)
Bilirubin Total: 0.5 mg/dL (ref 0.0–1.2)
CO2: 23 mmol/L (ref 20–29)
Calcium: 9.2 mg/dL (ref 8.7–10.3)
Chloride: 104 mmol/L (ref 96–106)
Creatinine, Ser: 0.91 mg/dL (ref 0.57–1.00)
GFR calc Af Amer: 69 mL/min/{1.73_m2} (ref >59)
GFR calc non Af Amer: 60 mL/min/{1.73_m2} (ref >59)
Globulin, Total: 2.8 g/dL (ref 1.5–4.5)
Glucose: 92 mg/dL (ref 65–99)
Potassium: 4.1 mmol/L (ref 3.5–5.2)
Sodium: 142 mmol/L (ref 134–144)
Total Protein: 7.1 g/dL (ref 6.0–8.5)

## 2020-12-20 LAB — THYROID PANEL WITH TSH
Free Thyroxine Index: 2.4 (ref 1.2–4.9)
T3 Uptake Ratio: 28 % (ref 24–39)
T4, Total: 8.6 ug/dL (ref 4.5–12.0)
TSH: 2.85 u[IU]/mL (ref 0.450–4.500)

## 2020-12-31 ENCOUNTER — Ambulatory Visit (INDEPENDENT_AMBULATORY_CARE_PROVIDER_SITE_OTHER): Payer: Medicare Other | Admitting: *Deleted

## 2020-12-31 DIAGNOSIS — Z0000 Encounter for general adult medical examination without abnormal findings: Secondary | ICD-10-CM

## 2020-12-31 NOTE — Patient Instructions (Signed)
MEDICARE ANNUAL WELLNESS VISIT Health Maintenance Summary and Written Plan of Care  [name] ,  Thank you for allowing me to perform your Medicare Annual Wellness Visit and for your ongoing commitment to your health.   Health Maintenance & Immunization History Health Maintenance  Topic Date Due  . DEXA SCAN  05/16/2021 (Originally 02/01/2006)  . Hepatitis C Screening  05/16/2021 (Originally September 09, 1941)  . TETANUS/TDAP  08/02/2025  . INFLUENZA VACCINE  Completed  . COVID-19 Vaccine  Completed  . PNA vac Low Risk Adult  Completed   Immunization History  Administered Date(s) Administered  . Fluad Quad(high Dose 65+) 09/08/2019, 09/07/2020  . Influenza, High Dose Seasonal PF 09/10/2016, 08/10/2017, 09/09/2018  . Influenza, Seasonal, Injecte, Preservative Fre 09/14/2014, 09/03/2015  . Moderna Sars-Covid-2 Vaccination 02/22/2020, 03/19/2020, 10/22/2020  . Pneumococcal Conjugate-13 08/03/2015  . Pneumococcal Polysaccharide-23 12/09/2016  . Tdap 08/03/2015    These are the patient goals that we discussed: Goals Addressed            This Visit's Progress   . AWV       12/31/2020 AWV Goal: Fall Prevention  . Over the next year, patient will decrease their risk for falls by: o Using assistive devices, such as a cane or walker, as needed o Identifying fall risks within their home and correcting them by: - Removing throw rugs - Adding handrails to stairs or ramps - Removing clutter and keeping a clear pathway throughout the home - Increasing light, especially at night - Adding shower handles/bars - Raising toilet seat o Identifying potential personal risk factors for falls: - Medication side effects - Incontinence/urgency - Vestibular dysfunction - Hearing loss - Musculoskeletal disorders - Neurological disorders - Orthostatic hypotension  12/31/2020 AWV Goal: Exercise for General Health   Patient will verbalize understanding of the benefits of increased physical  activity:  Exercising regularly is important. It will improve your overall fitness, flexibility, and endurance.  Regular exercise also will improve your overall health. It can help you control your weight, reduce stress, and improve your bone density.  Over the next year, patient will increase physical activity as tolerated with a goal of at least 150 minutes of moderate physical activity per week.   You can tell that you are exercising at a moderate intensity if your heart starts beating faster and you start breathing faster but can still hold a conversation.  Moderate-intensity exercise ideas include:  Walking 1 mile (1.6 km) in about 15 minutes  Biking  Hiking  Golfing  Dancing  Water aerobics  Patient will verbalize understanding of everyday activities that increase physical activity by providing examples like the following: ? Yard work, such as: ? Pushing a Surveyor, mining ? Raking and bagging leaves ? Washing your car ? Pushing a stroller ? Shoveling snow ? Gardening ? Washing windows or floors  Patient will be able to explain general safety guidelines for exercising:   Before you start a new exercise program, talk with your health care provider.  Do not exercise so much that you hurt yourself, feel dizzy, or get very short of breath.  Wear comfortable clothes and wear shoes with good support.  Drink plenty of water while you exercise to prevent dehydration or heat stroke.  Work out until your breathing and your heartbeat get faster.         This is a list of Health Maintenance Items that are overdue or due now: There are no preventive care reminders to display for this patient.  Orders/Referrals Placed Today: No orders of the defined types were placed in this encounter.  (Contact our referral department at [phone] if you have not spoken with someone about your referral appointment within the next 5 days)    Follow-up Plan . Follow-up with Gwenlyn Fudge, FNP as planned . Schedule your yearly eye exam  . We will complete Dexa scan at next appointment

## 2020-12-31 NOTE — Progress Notes (Addendum)
MEDICARE ANNUAL WELLNESS VISIT  12/31/2020  Telephone Visit Disclaimer This Medicare AWV was conducted by telephone due to national recommendations for restrictions regarding the COVID-19 Pandemic (e.g. social distancing).  I verified, using two identifiers, that I am speaking with Hannah [name] or their authorized healthcare agent. I discussed the limitations, risks, security, and privacy concerns of performing an evaluation and management service by telephone and the potential availability of an in-person appointment in the future. The patient expressed understanding and agreed to proceed.  Location of Patient: Home  Location of Provider (nurse):  [facility]   Subjective:    Hannah [name] is a 80 y.o. female patient of Gwenlyn Fudge, FNP who had a Medicare Annual Wellness Visit today via telephone. Hannah [name] is Retired and lives alone. she has 1 daughter. she reports that she is socially active and does interact with friends/family regularly. she is minimally physically active and enjoys reading, working in the yard, and crossword puzzles.  Patient Care Team: Gwenlyn Fudge, FNP as PCP - General (Family Medicine)  Advanced Directives 12/31/2020 10/24/2019 06/22/2019 06/20/2019 06/20/2019  Does Patient Have a Medical Advance Directive? No No No No No  Would patient like information on creating a medical advance directive? No - Patient declined No - Patient declined - No - Guardian declined -    Hospital Utilization Over the Past 12 Months: # of hospitalizations or ER visits: 0 # of surgeries: 0  Review of Systems    Patient reports that her overall health is unchanged compared to last year.  History obtained from chart review and the patient  Patient Reported Readings (BP, Pulse, CBG, Weight, etc) none  Pain Assessment Pain : No/denies pain     Current Medications & Allergies (verified) Allergies as of 12/31/2020      Reactions   Penicillins  Rash   Did it involve swelling of the face/tongue/throat, SOB, or low BP? Unknown Did it involve sudden or severe rash/hives, skin peeling, or any reaction on the inside of your mouth or nose? Ruta Hinds Did you need to seek medical attention at a hospital or doctor's office? No When did it last happen?childhood If all above answers are "NO", may proceed with cephalosporin use.   Sulfa Antibiotics Rash      Medication List       Accurate as of December 31, 2020  8:31 AM. If you have any questions, ask your nurse or doctor.        atorvastatin 10 MG tablet Commonly known as: LIPITOR Take 1 tablet (10 mg total) by mouth daily.   Calcium Carb-Cholecalciferol 600-400 MG-UNIT Tabs Take 1 tablet by mouth daily.   levothyroxine 75 MCG tablet Commonly known as: SYNTHROID Take 1 tablet (75 mcg total) by mouth daily.   lisinopril-hydrochlorothiazide 20-25 MG tablet Commonly known as: ZESTORETIC Take 0.5 tablets by mouth daily.   multivitamin tablet Take 1 tablet by mouth daily.       History (reviewed): Past Medical History:  Diagnosis Date  . Cataract   . Cataracts, bilateral   . Hyperlipidemia   . Hypertension   . Incarcerated umbilical hernia 06/21/2019  . Thyroid disease    Past Surgical History:  Procedure Laterality Date  . ABDOMINAL HYSTERECTOMY    . APPENDECTOMY    . BLADDER SURGERY    . EYE SURGERY     Cataracts  . HERNIA REPAIR    . UMBILICAL HERNIA REPAIR N/A 06/22/2019   Procedure: HERNIA REPAIR UMBILICAL  ADULT;  Surgeon: Franky Macho, MD;  Location: [facility];  Service: General;  Laterality: N/A;   Family History  Problem Relation Age of Onset  . Stroke Mother   . Heart disease Mother   . Heart disease Father   . Stroke Father   . Cancer Father   . Cancer Sister   . Aneurysm Brother    Social History   Socioeconomic History  . Marital status: Widowed    Spouse name: Not on file  . Number of children: 1  . Years of education: 81  . Highest  education level: Not on file  Occupational History  . Occupation: Retired  Tobacco Use  . Smoking status: Never Smoker  . Smokeless tobacco: Never Used  Vaping Use  . Vaping Use: Never used  Substance and Sexual Activity  . Alcohol use: Never  . Drug use: Never  . Sexual activity: Not Currently  Other Topics Concern  . Not on file  Social History Narrative  . Not on file   Social Determinants of Health   Financial Resource Strain: Not on file  Food Insecurity: Not on file  Transportation Needs: Not on file  Physical Activity: Not on file  Stress: Not on file  Social Connections: Not on file    Activities of Daily Living In your present state of health, do you have any difficulty performing the following activities: 12/31/2020  Hearing? N  Vision? Y  Comment Has catracts in both eyes  Difficulty concentrating or making decisions? N  Walking or climbing stairs? N  Dressing or bathing? N  Doing errands, shopping? N  Preparing Food and eating ? N  Using the Toilet? N  In the past six months, have you accidently leaked urine? N  Do you have problems with loss of bowel control? N  Managing your Medications? N  Managing your Finances? N  Housekeeping or managing your Housekeeping? N  Some recent data might be hidden    Patient Education/ Literacy How often do you need to have someone help you when you read instructions, pamphlets, or other written materials from your doctor or pharmacy?: 1 - Never What is the last grade level you completed in school?: 12th  Exercise Current Exercise Habits: Home exercise routine, Type of exercise: walking, Time (Minutes): 20, Frequency (Times/Week): 3, Weekly Exercise (Minutes/Week): 60, Intensity: Mild, Exercise limited by: None identified  Diet Patient reports consuming 3 meals a day and 0 snack(s) a day Patient reports that her primary diet is: Regular Patient reports that she does have regular access to food.   Depression  Screen PHQ 2/9 Scores 12/31/2020 12/19/2020 10/19/2020 05/16/2020 11/14/2019 10/24/2019 07/04/2019  PHQ - 2 Score 0 0 0 0 0 0 0     Fall Risk Fall Risk  12/31/2020 12/19/2020 10/19/2020 05/16/2020 11/14/2019  Falls in the past year? 0 0 0 0 0  Follow up - - - - -     Objective:  Hannah [name] seemed alert and oriented and she participated appropriately during our telephone visit.  Blood Pressure Weight BMI  BP Readings from Last 3 Encounters:  12/19/20 139/73  10/19/20 135/68  05/16/20 130/76   Wt Readings from Last 3 Encounters:  12/19/20 174 lb 12.8 oz (79.3 kg)  10/19/20 175 lb 4 oz (79.5 kg)  05/16/20 175 lb 6.4 oz (79.6 kg)   BMI Readings from Last 1 Encounters:  12/19/20 28.21 kg/m    *Unable to obtain current vital signs, weight, and BMI due to  telephone visit type  Hearing/Vision  . [place] did not seem to have difficulty with hearing/understanding during the telephone conversation . Reports that she has had a formal eye exam by an eye care professional within the past year . Reports that she has not had a formal hearing evaluation within the past year *Unable to fully assess hearing and vision during telephone visit type  Cognitive Function: 6CIT Screen 12/31/2020 10/24/2019  What Year? 0 [name] 0 [name]  What month? 0 [name] 0 [name]  What time? 0 [name] 0 [name]  Count back from 20 0 [name] 2 [name]  Months in reverse 0 [name] 0 [name]  Repeat phrase 2 [name] 4 [name]  Total Score 2 6   (Normal:0-7, Significant for Dysfunction: >8)  Normal Cognitive Function Screening: Yes   Immunization & Health Maintenance Record Immunization History  Administered Date(s) Administered  . Fluad Quad(high Dose 65+) 09/08/2019, 09/07/2020  . Influenza, High Dose Seasonal PF 09/10/2016, 08/10/2017, 09/09/2018  . Influenza, Seasonal, Injecte, Preservative Fre 09/14/2014, 09/03/2015  . Moderna Sars-Covid-2 Vaccination 02/22/2020, 03/19/2020, 10/22/2020  . Pneumococcal  Conjugate-13 08/03/2015  . Pneumococcal Polysaccharide-23 12/09/2016  . Tdap 08/03/2015    Health Maintenance  Topic Date Due  . DEXA SCAN  05/16/2021 (Originally 02/01/2006)  . Hepatitis C Screening  05/16/2021 (Originally July 29, 1941)  . TETANUS/TDAP  08/02/2025  . INFLUENZA VACCINE  Completed  . COVID-19 Vaccine  Completed  . PNA vac Low Risk Adult  Completed       Assessment  This is a routine wellness examination for [facility].  Health Maintenance: Due or Overdue There are no preventive care reminders to display for this patient.  Hannah [name] does not need a referral for [employer] Assistance: Care Management:   no Social Work:    no Prescription Assistance:  no Nutrition/Diabetes Education:  no   Plan:  Personalized Goals Goals Addressed            This Visit's Progress   . AWV       12/31/2020 AWV Goal: Fall Prevention  . Over the next year, patient will decrease their risk for falls by: o Using assistive devices, such as a cane or walker, as needed o Identifying fall risks within their home and correcting them by: - Removing throw rugs - Adding handrails to stairs or ramps - Removing clutter and keeping a clear pathway throughout the home - Increasing light, especially at night - Adding shower handles/bars - Raising toilet seat o Identifying potential personal risk factors for falls: - Medication side effects - Incontinence/urgency - Vestibular dysfunction - Hearing loss - Musculoskeletal disorders - Neurological disorders - Orthostatic hypotension  12/31/2020 AWV Goal: Exercise for General Health   Patient will verbalize understanding of the benefits of increased physical activity:  Exercising regularly is important. It will improve your overall fitness, flexibility, and endurance.  Regular exercise also will improve your overall health. It can help you control your weight, reduce stress, and improve your bone density.  Over the next year,  patient will increase physical activity as tolerated with a goal of at least 150 minutes of moderate physical activity per week.   You can tell that you are exercising at a moderate intensity if your heart starts beating faster and you start breathing faster but can still hold a conversation.  Moderate-intensity exercise ideas include:  Walking 1 mile (1.6 km) in about 15 minutes  Biking  Hiking  Golfing  Dancing  Water aerobics  Patient will verbalize understanding of  everyday activities that increase physical activity by providing examples like the following: ? Yard work, such as: ? Pushing a Surveyor, mining ? Raking and bagging leaves ? Washing your car ? Pushing a stroller ? Shoveling snow ? Gardening ? Washing windows or floors  Patient will be able to explain general safety guidelines for exercising:   Before you start a new exercise program, talk with your health care provider.  Do not exercise so much that you hurt yourself, feel dizzy, or get very short of breath.  Wear comfortable clothes and wear shoes with good support.  Drink plenty of water while you exercise to prevent dehydration or heat stroke.  Work out until your breathing and your heartbeat get faster.       Personalized Health Maintenance & Screening Recommendations  Bone densitometry screening  Lung Cancer Screening Recommended: no (Low Dose CT Chest recommended if Age 67-80 years, 30 pack-year currently smoking OR have quit w/in past 15 years) Hepatitis C Screening recommended: no HIV Screening recommended: no  Advanced Directives: Written information was not prepared per patient's request.  Referrals & Orders No orders of the defined types were placed in this encounter.   Follow-up Plan . Follow-up with Gwenlyn Fudge, FNP as planned . Schedule your yearly eye exam  . We will complete Dexa scan at next appointment . AVS printed and mailed to patient   I have personally reviewed and  noted the following in the patient's chart:   . Medical and social history . Use of alcohol, tobacco or illicit drugs  . Current medications and supplements . Functional ability and status . Nutritional status . Physical activity . Advanced directives . List of other physicians . Hospitalizations, surgeries, and ER visits in previous 12 months . Vitals . Screenings to include cognitive, depression, and falls . Referrals and appointments  In addition, I have reviewed and discussed with Hannah [name] certain preventive protocols, quality metrics, and best practice recommendations. A written personalized care plan for preventive services as well as general preventive health recommendations is available and can be mailed to the patient at her request.     Billee Cashing, LPN  9/56/2130

## 2021-02-11 ENCOUNTER — Encounter: Payer: Self-pay | Admitting: Nurse Practitioner

## 2021-02-11 ENCOUNTER — Other Ambulatory Visit: Payer: Self-pay

## 2021-02-11 ENCOUNTER — Ambulatory Visit (INDEPENDENT_AMBULATORY_CARE_PROVIDER_SITE_OTHER): Payer: Medicare Other | Admitting: Nurse Practitioner

## 2021-02-11 VITALS — BP 156/79 | HR 107 | Temp 97.3°F | Ht 66.0 in | Wt 176.4 lb

## 2021-02-11 DIAGNOSIS — R300 Dysuria: Secondary | ICD-10-CM | POA: Insufficient documentation

## 2021-02-11 LAB — MICROSCOPIC EXAMINATION
RBC, Urine: NONE SEEN /hpf (ref 0–2)
WBC, UA: >30 /hpf — ABNORMAL HIGH (ref 0–5)

## 2021-02-11 LAB — URINALYSIS, COMPLETE
Bilirubin, UA: NEGATIVE
Glucose, UA: NEGATIVE
Ketones, UA: NEGATIVE
Nitrite, UA: POSITIVE — AB
Protein,UA: NEGATIVE
Specific Gravity, UA: 1.015 (ref 1.005–1.030)
Urobilinogen, Ur: 0.2 mg/dL (ref 0.2–1.0)
pH, UA: 7 (ref 5.0–7.5)

## 2021-02-11 MED ORDER — CEPHALEXIN 500 MG PO CAPS: 500.0000 mg | ORAL_CAPSULE | Freq: Two times a day (BID) | ORAL | 0 refills | Status: DC

## 2021-02-11 MED ORDER — PHENAZOPYRIDINE HCL 100 MG PO TABS: 100.0000 mg | ORAL_TABLET | Freq: Three times a day (TID) | ORAL | 0 refills | Status: DC | PRN

## 2021-02-11 MED ORDER — CIPROFLOXACIN HCL 500 MG PO TABS: 500.0000 mg | ORAL_TABLET | Freq: Two times a day (BID) | ORAL | 0 refills | Status: DC

## 2021-02-11 NOTE — Progress Notes (Signed)
Acute Office Visit  Subjective:    Patient ID: Hannah [name], female    DOB: 06-14-1941, 80 y.o.   MRN: [id number]  Chief Complaint  Patient presents with  . Dysuria    X 2 days     Dysuria  This is a recurrent problem. The current episode started yesterday. The problem occurs every urination. The problem has been gradually worsening. The quality of the pain is described as burning. The pain is moderate. There has been no fever. She is not sexually active. There is no history of pyelonephritis. Associated symptoms include frequency and urgency. Pertinent negatives include no chills, discharge, flank pain, nausea or vomiting. She has tried nothing for the symptoms. Her past medical history is significant for recurrent UTIs.     Past Medical History:  Diagnosis Date  . Cataract   . Cataracts, bilateral   . Hyperlipidemia   . Hypertension   . Incarcerated umbilical hernia 06/21/2019  . Thyroid disease     Past Surgical History:  Procedure Laterality Date  . ABDOMINAL HYSTERECTOMY    . APPENDECTOMY    . BLADDER SURGERY    . EYE SURGERY     Cataracts  . HERNIA REPAIR    . UMBILICAL HERNIA REPAIR N/A 06/22/2019   Procedure: HERNIA REPAIR UMBILICAL ADULT;  Surgeon: Franky Macho, MD;  Location: [facility];  Service: General;  Laterality: N/A;    Family History  Problem Relation Age of Onset  . Stroke Mother   . Heart disease Mother   . Heart disease Father   . Stroke Father   . Cancer Father   . Cancer Sister   . Aneurysm Brother     Social History   Socioeconomic History  . Marital status: Widowed    Spouse name: Not on file  . Number of children: 1  . Years of education: 99  . Highest education level: Not on file  Occupational History  . Occupation: Retired  Tobacco Use  . Smoking status: Never Smoker  . Smokeless tobacco: Never Used  Vaping Use  . Vaping Use: Never used  Substance and Sexual Activity  . Alcohol use: Never  . Drug use: Never  . Sexual  activity: Not Currently  Other Topics Concern  . Not on file  Social History Narrative  . Not on file   Social Determinants of Health   Financial Resource Strain: Not on file  Food Insecurity: Not on file  Transportation Needs: Not on file  Physical Activity: Not on file  Stress: Not on file  Social Connections: Not on file  Intimate Partner Violence: Not on file    Outpatient Medications Prior to Visit  Medication Sig Dispense Refill  . atorvastatin (LIPITOR) 10 MG tablet Take 1 tablet (10 mg total) by mouth daily. 90 tablet 3  . Calcium Carb-Cholecalciferol 600-400 MG-UNIT TABS Take 1 tablet by mouth daily.     Marland Kitchen levothyroxine (SYNTHROID) 75 MCG tablet Take 1 tablet (75 mcg total) by mouth daily. 30 tablet 2  . lisinopril-hydrochlorothiazide (ZESTORETIC) 20-25 MG tablet Take 0.5 tablets by mouth daily. 45 tablet 3  . Multiple Vitamin (MULTIVITAMIN) tablet Take 1 tablet by mouth daily.     No facility-administered medications prior to visit.    Allergies  Allergen Reactions  . Penicillins Rash    Did it involve swelling of the face/tongue/throat, SOB, or low BP? Unknown Did it involve sudden or severe rash/hives, skin peeling, or any reaction on the inside of your mouth or  nose? Ruta Hinds Did you need to seek medical attention at a hospital or doctor's office? No When did it last happen?childhood If all above answers are "NO", may proceed with cephalosporin use.   . Sulfa Antibiotics Rash    Review of Systems  Constitutional: Negative for chills, fatigue and fever.  HENT: Negative.   Respiratory: Negative.   Cardiovascular: Negative.   Gastrointestinal: Negative for nausea and vomiting.  Genitourinary: Positive for dysuria, frequency and urgency. Negative for flank pain.  Musculoskeletal: Negative.   Skin: Negative.   All other systems reviewed and are negative.      Objective:    Physical Exam Vitals reviewed.  Constitutional:      Appearance: Normal  appearance.  HENT:     Head: Normocephalic.     Nose: Nose normal.  Eyes:     Extraocular Movements: Extraocular movements intact.     Conjunctiva/sclera: Conjunctivae normal.  Cardiovascular:     Pulses: Normal pulses.     Heart sounds: Normal heart sounds.  Pulmonary:     Effort: Pulmonary effort is normal.     Breath sounds: Normal breath sounds.  Abdominal:     General: Bowel sounds are normal.     Tenderness: There is no right CVA tenderness or left CVA tenderness.  Genitourinary:    Comments: Dysuria, frequency Musculoskeletal:        General: Normal range of motion.  Neurological:     Mental Status: She is alert and oriented to person, place, and time.     BP (!) 156/79   Pulse (!) 107   Temp (!) 97.3 F (36.3 C) (Temporal)   Ht 5\' 6"  (1.676 m)   Wt 176 lb 6.4 oz (80 kg)   SpO2 96%   BMI 28.47 kg/m  Wt Readings from Last 3 Encounters:  02/11/21 176 lb 6.4 oz (80 kg)  12/19/20 174 lb 12.8 oz (79.3 kg)  10/19/20 175 lb 4 oz (79.5 kg)    There are no preventive care reminders to display for this patient.  There are no preventive care reminders to display for this patient.   Lab Results  Component Value Date   TSH 2.850 12/19/2020   Lab Results  Component Value Date   WBC 5.3 12/19/2020   HGB 14.4 12/19/2020   HCT 43.8 12/19/2020   MCV 87 12/19/2020   PLT 137 (L) 12/19/2020   Lab Results  Component Value Date   NA 142 12/19/2020   K 4.1 12/19/2020   CO2 23 12/19/2020   GLUCOSE 92 12/19/2020   BUN 17 12/19/2020   CREATININE 0.91 12/19/2020   BILITOT 0.5 12/19/2020   ALKPHOS 86 12/19/2020   AST 22 12/19/2020   ALT 13 12/19/2020   PROT 7.1 12/19/2020   ALBUMIN 4.3 12/19/2020   CALCIUM 9.2 12/19/2020   ANIONGAP 10 06/24/2019   Lab Results  Component Value Date   CHOL 135 12/19/2020   Lab Results  Component Value Date   HDL 44 12/19/2020   Lab Results  Component Value Date   LDLCALC 69 12/19/2020   Lab Results  Component Value Date    TRIG 125 12/19/2020   Lab Results  Component Value Date   CHOLHDL 3.1 12/19/2020   No results found for: HGBA1C     Assessment & Plan:   Problem List Items Addressed This Visit      Other   Dysuria - Primary    Symptoms of dysuria not well controlled.  This is not new  for patient but recurrent.  Education provided to patient to increase hydration, started patient on Cipro for 5 days, Pyridium 100 mg tablet for 3 days.  Completed urinalysis which shows presence of leukocytes and nitrites, with many bacteria. Cultures ordered results pending.   Rx sent to pharmacy.  Patient knows to follow-up with worsening or unresolved symptoms.      Relevant Medications   phenazopyridine (PYRIDIUM) 100 MG tablet   ciprofloxacin (CIPRO) 500 MG tablet   Other Relevant Orders   Urinalysis, Complete (Completed)   Urine Culture       Meds ordered this encounter  Medications  . DISCONTD: cephALEXin (KEFLEX) 500 MG capsule    Sig: Take 1 capsule (500 mg total) by mouth 2 (two) times daily.    Dispense:  14 capsule    Refill:  0    Order Specific Question:   Supervising Provider    Answer:   Raliegh Ip [[id number]]  . phenazopyridine (PYRIDIUM) 100 MG tablet    Sig: Take 1 tablet (100 mg total) by mouth 3 (three) times daily as needed for pain.    Dispense:  9 tablet    Refill:  0    Order Specific Question:   Supervising Provider    Answer:   Raliegh Ip [[id number]]  . ciprofloxacin (CIPRO) 500 MG tablet    Sig: Take 1 tablet (500 mg total) by mouth 2 (two) times daily.    Dispense:  10 tablet    Refill:  0    Order Specific Question:   Supervising Provider    Answer:   Raliegh Ip [[id number]]     Daryll Drown, NP

## 2021-02-11 NOTE — Patient Instructions (Signed)

## 2021-02-11 NOTE — Assessment & Plan Note (Signed)
Symptoms of dysuria not well controlled.  This is not new for patient but recurrent.  Education provided to patient to increase hydration, started patient on Cipro for 5 days, Pyridium 100 mg tablet for 3 days.  Completed urinalysis which shows presence of leukocytes and nitrites, with many bacteria. Cultures ordered results pending.   Rx sent to pharmacy.  Patient knows to follow-up with worsening or unresolved symptoms.

## 2021-02-14 LAB — URINE CULTURE

## 2021-04-13 ENCOUNTER — Other Ambulatory Visit: Payer: Self-pay | Admitting: Family Medicine

## 2021-04-13 DIAGNOSIS — E039 Hypothyroidism, unspecified: Secondary | ICD-10-CM

## 2021-04-30 DIAGNOSIS — H31091 Other chorioretinal scars, right eye: Secondary | ICD-10-CM | POA: Diagnosis not present

## 2021-05-21 ENCOUNTER — Encounter: Payer: Self-pay | Admitting: Family Medicine

## 2021-05-21 ENCOUNTER — Other Ambulatory Visit: Payer: Self-pay

## 2021-05-21 ENCOUNTER — Ambulatory Visit (INDEPENDENT_AMBULATORY_CARE_PROVIDER_SITE_OTHER): Payer: Medicare Other | Admitting: Family Medicine

## 2021-05-21 VITALS — BP 138/64 | HR 75 | Temp 97.4°F | Ht 66.0 in | Wt 173.0 lb

## 2021-05-21 DIAGNOSIS — I10 Essential (primary) hypertension: Secondary | ICD-10-CM

## 2021-05-21 DIAGNOSIS — E039 Hypothyroidism, unspecified: Secondary | ICD-10-CM

## 2021-05-21 DIAGNOSIS — E7841 Elevated Lipoprotein(a): Secondary | ICD-10-CM | POA: Diagnosis not present

## 2021-05-21 NOTE — Progress Notes (Signed)
Assessment & Plan:  1. Acquired hypothyroidism Labs to assess since switching from brand name to generic. - TSH - T4, free  2. Elevated lipoprotein(a) Well controlled on current regimen.  - CMP14+EGFR - Lipid panel (fasting)  3. Essential hypertension Well controlled on current regimen.  - CBC with Differential/Platelet - CMP14+EGFR - Lipid panel (fasting)   Return in about 7 months (around 12/21/2021) for annual physical.  [name], MSN, APRN, FNP-C [facility]  Subjective:    Patient ID: Hannah [name], female    DOB: [birth year], 80 y.o.   MRN: [id number]  Patient Care Team: Gwenlyn Fudge, FNP as PCP - General (Family Medicine)   Chief Complaint:  Chief Complaint  Patient presents with   Hypertension   Hyperlipidemia    5 month follow up of chronic medical conditions     HPI: Hannah [name] is a 80 y.o. female presenting on 05/21/2021 for Hypertension and Hyperlipidemia (5 month follow up of chronic medical conditions )   Hypertension: Takes medication as prescribed.  Eats a low-salt diet.  Does not exercise.   Hyperlipidemia: Taking atorvastatin daily and tolerating it well.   Hypothyroidism: Taking Levothyroxine 75 mcg daily as prescribed. This was switched from brand name to generic five months ago.  New complaints: None  Social history:  Relevant past medical, surgical, family and social history reviewed and updated as indicated. Interim medical history since our last visit reviewed.  Allergies and medications reviewed and updated.  DATA REVIEWED: CHART IN EPIC  ROS: Negative unless specifically indicated above in HPI.    Current Outpatient Medications:    atorvastatin (LIPITOR) 10 MG tablet, Take 1 tablet (10 mg total) by mouth daily., Disp: 90 tablet, Rfl: 3   Calcium Carb-Cholecalciferol 600-400 MG-UNIT TABS, Take 1 tablet by mouth daily. , Disp: , Rfl:    levothyroxine (SYNTHROID) 75 MCG tablet, TAKE 1 TABLET BY  MOUTH EVERY DAY, Disp: 90 tablet, Rfl: 2   lisinopril-hydrochlorothiazide (ZESTORETIC) 20-25 MG tablet, Take 0.5 tablets by mouth daily., Disp: 45 tablet, Rfl: 3   Multiple Vitamin (MULTIVITAMIN) tablet, Take 1 tablet by mouth daily., Disp: , Rfl:    Allergies  Allergen Reactions   Penicillins Rash    Did it involve swelling of the face/tongue/throat, SOB, or low BP? Unknown Did it involve sudden or severe rash/hives, skin peeling, or any reaction on the inside of your mouth or nose? Ruta Hinds Did you need to seek medical attention at a hospital or doctor's office? No When did it last happen?  childhood If all above answers are "NO", may proceed with cephalosporin use.    Sulfa Antibiotics Rash   Past Medical History:  Diagnosis Date   Cataract    Cataracts, bilateral    Hyperlipidemia    Hypertension    Incarcerated umbilical hernia 06/21/2019   Thyroid disease     Past Surgical History:  Procedure Laterality Date   ABDOMINAL HYSTERECTOMY     APPENDECTOMY     BLADDER SURGERY     EYE SURGERY     Cataracts   HERNIA REPAIR     UMBILICAL HERNIA REPAIR N/A 06/22/2019   Procedure: HERNIA REPAIR UMBILICAL ADULT;  Surgeon: Franky Macho, MD;  Location: [facility];  Service: General;  Laterality: N/A;    Social History   Socioeconomic History   Marital status: Widowed    Spouse name: Not on file   Number of children: 1   Years of education: 12   Highest education level:  Not on file  Occupational History   Occupation: Retired  Tobacco Use   Smoking status: Never   Smokeless tobacco: Never  Vaping Use   Vaping Use: Never used  Substance and Sexual Activity   Alcohol use: Never   Drug use: Never   Sexual activity: Not Currently  Other Topics Concern   Not on file  Social History Narrative   Not on file   Social Determinants of Health   Financial Resource Strain: Not on file  Food Insecurity: Not on file  Transportation Needs: Not on file  Physical Activity: Not on  file  Stress: Not on file  Social Connections: Not on file  Intimate Partner Violence: Not on file        Objective:    BP 138/64   Pulse 75   Temp (!) 97.4 F (36.3 C) (Temporal)   Ht 5\' 6"  (1.676 m)   Wt 173 lb (78.5 kg)   SpO2 98%   BMI 27.92 kg/m   Wt Readings from Last 3 Encounters:  05/21/21 173 lb (78.5 kg)  02/11/21 176 lb 6.4 oz (80 kg)  12/19/20 174 lb 12.8 oz (79.3 kg)    Physical Exam Vitals reviewed.  Constitutional:      General: She is not in acute distress.    Appearance: Normal appearance. She is overweight. She is not ill-appearing, toxic-appearing or diaphoretic.  HENT:     Head: Normocephalic and atraumatic.  Eyes:     General: No scleral icterus.       Right eye: No discharge.        Left eye: No discharge.     Conjunctiva/sclera: Conjunctivae normal.  Cardiovascular:     Rate and Rhythm: Normal rate and regular rhythm.     Heart sounds: Normal heart sounds. No murmur heard.   No friction rub. No gallop.  Pulmonary:     Effort: Pulmonary effort is normal. No respiratory distress.     Breath sounds: Normal breath sounds. No stridor. No wheezing, rhonchi or rales.  Musculoskeletal:        General: Normal range of motion.     Cervical back: Normal range of motion.  Skin:    General: Skin is warm and dry.     Capillary Refill: Capillary refill takes less than 2 seconds.  Neurological:     General: No focal deficit present.     Mental Status: She is alert and oriented to person, place, and time. Mental status is at baseline.  Psychiatric:        Mood and Affect: Mood normal.        Behavior: Behavior normal.        Thought Content: Thought content normal.        Judgment: Judgment normal.    Lab Results  Component Value Date   TSH 2.850 12/19/2020   Lab Results  Component Value Date   WBC 5.3 12/19/2020   HGB 14.4 12/19/2020   HCT 43.8 12/19/2020   MCV 87 12/19/2020   PLT 137 (L) 12/19/2020   Lab Results  Component Value Date    NA 142 12/19/2020   K 4.1 12/19/2020   CO2 23 12/19/2020   GLUCOSE 92 12/19/2020   BUN 17 12/19/2020   CREATININE 0.91 12/19/2020   BILITOT 0.5 12/19/2020   ALKPHOS 86 12/19/2020   AST 22 12/19/2020   ALT 13 12/19/2020   PROT 7.1 12/19/2020   ALBUMIN 4.3 12/19/2020   CALCIUM 9.2 12/19/2020   ANIONGAP  10 06/24/2019   Lab Results  Component Value Date   CHOL 135 12/19/2020   Lab Results  Component Value Date   HDL 44 12/19/2020   Lab Results  Component Value Date   LDLCALC 69 12/19/2020   Lab Results  Component Value Date   TRIG 125 12/19/2020   Lab Results  Component Value Date   CHOLHDL 3.1 12/19/2020   No results found for: HGBA1C

## 2021-05-22 LAB — CMP14+EGFR
ALT: 17 IU/L (ref 0–32)
AST: 21 IU/L (ref 0–40)
Albumin/Globulin Ratio: 1.4 (ref 1.2–2.2)
Albumin: 4.2 g/dL (ref 3.7–4.7)
Alkaline Phosphatase: 83 IU/L (ref 44–121)
BUN/Creatinine Ratio: 25 (ref 12–28)
BUN: 22 mg/dL (ref 8–27)
Bilirubin Total: 0.5 mg/dL (ref 0.0–1.2)
CO2: 22 mmol/L (ref 20–29)
Calcium: 9.4 mg/dL (ref 8.7–10.3)
Chloride: 104 mmol/L (ref 96–106)
Creatinine, Ser: 0.88 mg/dL (ref 0.57–1.00)
Globulin, Total: 3.1 g/dL (ref 1.5–4.5)
Glucose: 94 mg/dL (ref 65–99)
Potassium: 3.5 mmol/L (ref 3.5–5.2)
Sodium: 146 mmol/L — ABNORMAL HIGH (ref 134–144)
Total Protein: 7.3 g/dL (ref 6.0–8.5)
eGFR: 66 mL/min/{1.73_m2} (ref >59)

## 2021-05-22 LAB — CBC WITH DIFFERENTIAL/PLATELET
Basophils Absolute: 0 10*3/uL (ref 0.0–0.2)
Basos: 0 %
EOS (ABSOLUTE): 0.1 10*3/uL (ref 0.0–0.4)
Eos: 2 %
Hematocrit: 41.1 % (ref 34.0–46.6)
Hemoglobin: 14.1 g/dL (ref 11.1–15.9)
Immature Grans (Abs): 0 10*3/uL (ref 0.0–0.1)
Immature Granulocytes: 0 %
Lymphocytes Absolute: 1.3 10*3/uL (ref 0.7–3.1)
Lymphs: 28 %
MCH: 29.4 pg (ref 26.6–33.0)
MCHC: 34.3 g/dL (ref 31.5–35.7)
MCV: 86 fL (ref 79–97)
Monocytes Absolute: 0.3 10*3/uL (ref 0.1–0.9)
Monocytes: 7 %
Neutrophils Absolute: 2.9 10*3/uL (ref 1.4–7.0)
Neutrophils: 63 %
Platelets: 120 10*3/uL — ABNORMAL LOW (ref 150–450)
RBC: 4.79 x10E6/uL (ref 3.77–5.28)
RDW: 13.3 % (ref 11.7–15.4)
WBC: 4.7 10*3/uL (ref 3.4–10.8)

## 2021-05-22 LAB — LIPID PANEL
Chol/HDL Ratio: 3 ratio (ref 0.0–4.4)
Cholesterol, Total: 130 mg/dL (ref 100–199)
HDL: 44 mg/dL (ref >39)
LDL Chol Calc (NIH): 61 mg/dL (ref 0–99)
Triglycerides: 143 mg/dL (ref 0–149)
VLDL Cholesterol Cal: 25 mg/dL (ref 5–40)

## 2021-05-22 LAB — TSH: TSH: 1.81 u[IU]/mL (ref 0.450–4.500)

## 2021-05-22 LAB — T4, FREE: Free T4: 1.49 ng/dL (ref 0.82–1.77)

## 2021-08-02 ENCOUNTER — Ambulatory Visit: Payer: Medicare Other | Admitting: Family Medicine

## 2021-08-02 DIAGNOSIS — N3001 Acute cystitis with hematuria: Secondary | ICD-10-CM | POA: Diagnosis not present

## 2021-08-02 DIAGNOSIS — M549 Dorsalgia, unspecified: Secondary | ICD-10-CM | POA: Diagnosis not present

## 2021-08-02 DIAGNOSIS — R300 Dysuria: Secondary | ICD-10-CM | POA: Diagnosis not present

## 2021-09-10 ENCOUNTER — Other Ambulatory Visit: Payer: Self-pay

## 2021-09-10 ENCOUNTER — Ambulatory Visit (INDEPENDENT_AMBULATORY_CARE_PROVIDER_SITE_OTHER): Payer: Medicare Other

## 2021-09-10 DIAGNOSIS — Z23 Encounter for immunization: Secondary | ICD-10-CM

## 2021-10-07 ENCOUNTER — Other Ambulatory Visit: Payer: Self-pay

## 2021-10-07 ENCOUNTER — Ambulatory Visit (INDEPENDENT_AMBULATORY_CARE_PROVIDER_SITE_OTHER): Payer: Medicare Other | Admitting: Nurse Practitioner

## 2021-10-07 ENCOUNTER — Encounter: Payer: Self-pay | Admitting: Nurse Practitioner

## 2021-10-07 VITALS — BP 141/68 | HR 88 | Temp 97.6°F | Resp 20 | Ht 66.0 in | Wt 172.0 lb

## 2021-10-07 DIAGNOSIS — R300 Dysuria: Secondary | ICD-10-CM

## 2021-10-07 DIAGNOSIS — N3000 Acute cystitis without hematuria: Secondary | ICD-10-CM | POA: Diagnosis not present

## 2021-10-07 LAB — MICROSCOPIC EXAMINATION
Epithelial Cells (non renal): NONE SEEN /hpf (ref 0–10)
RBC, Urine: NONE SEEN /hpf (ref 0–2)

## 2021-10-07 LAB — URINALYSIS, COMPLETE
Bilirubin, UA: NEGATIVE
Glucose, UA: NEGATIVE
Ketones, UA: NEGATIVE
Nitrite, UA: NEGATIVE
Protein,UA: NEGATIVE
Specific Gravity, UA: 1.01 (ref 1.005–1.030)
Urobilinogen, Ur: 0.2 mg/dL (ref 0.2–1.0)
pH, UA: 6 (ref 5.0–7.5)

## 2021-10-07 MED ORDER — PHENAZOPYRIDINE HCL 200 MG PO TABS: 200.0000 mg | ORAL_TABLET | Freq: Three times a day (TID) | ORAL | 0 refills | Status: DC | PRN

## 2021-10-07 MED ORDER — CEPHALEXIN 500 MG PO CAPS: 500.0000 mg | ORAL_CAPSULE | Freq: Two times a day (BID) | ORAL | 0 refills | Status: DC

## 2021-10-07 NOTE — Patient Instructions (Signed)

## 2021-10-07 NOTE — Progress Notes (Signed)
Subjective:    Patient ID: Hannah [name], female    DOB: Oct 05, 1941, 80 y.o.   MRN: [id number]  Chief Complaint: Dysuria   HPI Patient comes in today c/o dysuria , frequency and urgency. Started abut 2 days ago. Has gotten no better.     Review of Systems  Constitutional:  Negative for diaphoresis.  Eyes:  Negative for pain.  Respiratory:  Negative for shortness of breath.   Cardiovascular:  Negative for chest pain, palpitations and leg swelling.  Gastrointestinal:  Negative for abdominal pain.  Endocrine: Negative for polydipsia.  Genitourinary:  Positive for dysuria, frequency and urgency.  Skin:  Negative for rash.  Neurological:  Negative for dizziness, weakness and headaches.  Hematological:  Does not bruise/bleed easily.  All other systems reviewed and are negative.     Objective:   Physical Exam Constitutional:      Appearance: Normal appearance.  Cardiovascular:     Rate and Rhythm: Regular rhythm.     Heart sounds: Normal heart sounds.  Pulmonary:     Effort: Pulmonary effort is normal.     Breath sounds: Normal breath sounds.  Abdominal:     General: Abdomen is flat. Bowel sounds are normal.     Palpations: Abdomen is soft.     Tenderness: There is no right CVA tenderness or left CVA tenderness.  Skin:    General: Skin is warm.  Neurological:     General: No focal deficit present.     Mental Status: She is alert.  Psychiatric:        Mood and Affect: Mood normal.        Behavior: Behavior normal.    BP (!) 141/68   Pulse 88   Temp 97.6 F (36.4 C) (Temporal)   Resp 20   Ht 5\' 6"  (1.676 m)   Wt 172 lb (78 kg)   SpO2 97%   BMI 27.76 kg/m        Assessment & Plan:  Hannah [name] in today with chief complaint of Dysuria   1. Dysuria - Urinalysis, Complete  2. Acute cystitis without hematuria Take medication as prescribe Cotton underwear Take shower not bath Cranberry juice, yogurt Force fluids AZO over the counter X2 days Culture  pending RTO prn  - cephALEXin (KEFLEX) 500 MG capsule; Take 1 capsule (500 mg total) by mouth 2 (two) times daily.  Dispense: 14 capsule; Refill: 0 - phenazopyridine (PYRIDIUM) 200 MG tablet; Take 1 tablet (200 mg total) by mouth 3 (three) times daily as needed for pain.  Dispense: 10 tablet; Refill: 0    The above assessment and management plan was discussed with the patient. The patient verbalized understanding of and has agreed to the management plan. Patient is aware to call the clinic if symptoms persist or worsen. Patient is aware when to return to the clinic for a follow-up visit. Patient educated on when it is appropriate to go to the emergency department.   Mary-Margaret Daphine Deutscher, FNP

## 2021-12-24 ENCOUNTER — Ambulatory Visit (INDEPENDENT_AMBULATORY_CARE_PROVIDER_SITE_OTHER): Payer: Medicare Other | Admitting: Family Medicine

## 2021-12-24 ENCOUNTER — Encounter: Payer: Self-pay | Admitting: Family Medicine

## 2021-12-24 ENCOUNTER — Ambulatory Visit (INDEPENDENT_AMBULATORY_CARE_PROVIDER_SITE_OTHER): Payer: Medicare Other

## 2021-12-24 VITALS — BP 165/82 | HR 83 | Temp 97.3°F | Ht 66.0 in | Wt 172.8 lb

## 2021-12-24 DIAGNOSIS — I10 Essential (primary) hypertension: Secondary | ICD-10-CM

## 2021-12-24 DIAGNOSIS — Z780 Asymptomatic menopausal state: Secondary | ICD-10-CM

## 2021-12-24 DIAGNOSIS — Z0001 Encounter for general adult medical examination with abnormal findings: Secondary | ICD-10-CM | POA: Diagnosis not present

## 2021-12-24 DIAGNOSIS — E7841 Elevated Lipoprotein(a): Secondary | ICD-10-CM | POA: Diagnosis not present

## 2021-12-24 DIAGNOSIS — E039 Hypothyroidism, unspecified: Secondary | ICD-10-CM

## 2021-12-24 DIAGNOSIS — Z0000 Encounter for general adult medical examination without abnormal findings: Secondary | ICD-10-CM

## 2021-12-24 MED ORDER — LISINOPRIL-HYDROCHLOROTHIAZIDE 20-25 MG PO TABS: 1.0000 | ORAL_TABLET | Freq: Every day | ORAL | 1 refills | Status: DC

## 2021-12-24 MED ORDER — LEVOTHYROXINE SODIUM 75 MCG PO TABS: 75.0000 ug | ORAL_TABLET | Freq: Every day | ORAL | 1 refills | Status: DC

## 2021-12-24 NOTE — Progress Notes (Signed)
Assessment & Plan:  1. Well adult exam - health maintenance needs discussed, agreeable to DEXA today. Declined Shingrix vaccine. - printed wellness information provided - CBC with Differential/Platelet - CMP14+EGFR - Lipid panel  2. Acquired hypothyroidism - Well controlled on current regimen - levothyroxine (SYNTHROID) 75 MCG tablet; Take 1 tablet (75 mcg total) by mouth daily.  Dispense: 90 tablet; Refill: 1 - T4, free - TSH  3. Essential hypertension - uncontrolled, will increase to full tablet of lisinopril-HCTZ 20-25 mg.  - CBC with Differential/Platelet - CMP14+EGFR - Lipid panel - lisinopril-hydrochlorothiazide (ZESTORETIC) 20-25 MG tablet; Take 1 tablet by mouth daily.  Dispense: 90 tablet; Refill: 1  4. Elevated lipoprotein(a) - labs to assess - CMP14+EGFR - Lipid panel  5. Postmenopausal estrogen deficiency - DG [facility] DEXA - continue calcium and Vitamin D supplementation   Follow-up: Return in about 6 weeks (around 02/04/2022) for HTN.   Floy Sabina, NP Student  I personally was present during the history, physical exam, and medical decision-making activities of this service and have verified that the service and findings are accurately documented in the nurse practitioner student's note.  [name], MSN, APRN, FNP-C [facility]  Subjective:  Patient ID: Hannah [name], female    DOB: [birth year]  Age: 81 y.o. MRN: [id number]  Patient Care Team: Gwenlyn Fudge, FNP as PCP - General (Family Medicine)   CC:  Chief Complaint  Patient presents with   Annual Exam    HPI Hannah [name] presents for her annual physical.   Health Maintenance: Occupation: retired, Marital status: widowed, Substance use: none Diet: eats healthy, low salt, Exercise: none Last eye exam: May 2022 - got a new glasses prescription, has cataracts Last dental exam: a couple of months ago DEXA: agreeable to do today  Immunizations:  Flu Vaccine: up to  date Tdap Vaccine: up to date  Shingrix Vaccine: declined  COVID-19 Vaccine: up to date Pneumonia Vaccine: up to date  Advanced Directives Patient does not have advanced directives and does not desire to have any as her daughter knows all of her wishes.  DEPRESSION SCREENING Depression screen [facility] 2/9 12/24/2021 05/21/2021 12/31/2020  Decreased Interest 0 3 0  Down, Depressed, Hopeless 0 0 0  PHQ - 2 Score 0 3 0  Altered sleeping 0 0 -  Tired, decreased energy 0 0 -  Change in appetite 0 0 -  Feeling bad or failure about yourself  0 0 -  Trouble concentrating 0 0 -  Moving slowly or fidgety/restless 0 0 -  Suicidal thoughts 0 0 -  PHQ-9 Score 0 3 -  Difficult doing work/chores Not difficult at all Not difficult at all -   GAD 7 : Generalized Anxiety Score 12/24/2021  Nervous, Anxious, on Edge 0  Control/stop worrying 0  Worry too much - different things 0  Trouble relaxing 0  Restless 0  Easily annoyed or irritable 0  Afraid - awful might happen 0  Total GAD 7 Score 0  Anxiety Difficulty Not difficult at all    Hypertension: Takes medication as prescribed.  Eats a low-salt diet.  Does not exercise.She is elevated today, and does not have the equipment at home to take her BP. She is requesting to increase from the half tablet to the full tablet of lisinopril-HCTZ 20-25 since she has been high the last few times she has had it checked.   Hyperlipidemia: Taking atorvastatin daily and tolerating it well.  Hypothyroidism: Taking Synthroid 75 mcg  daily as prescribed.    Review of Systems  Constitutional:  Negative for chills, fever, malaise/fatigue and weight loss.  HENT:  Positive for congestion (occasionally with seasonal allergies). Negative for ear discharge, ear pain, nosebleeds, sinus pain, sore throat and tinnitus.   Eyes:  Negative for blurred vision, double vision, pain, discharge and redness.  Respiratory:  Negative for cough, shortness of breath and wheezing.    Cardiovascular:  Negative for chest pain, palpitations and leg swelling.  Gastrointestinal:  Negative for abdominal pain, constipation, diarrhea, heartburn, nausea and vomiting.  Genitourinary:  Negative for dysuria, frequency and urgency.  Musculoskeletal:  Positive for joint pain (right knee and low back occasionally). Negative for myalgias.  Skin:  Negative for rash.  Neurological:  Negative for dizziness, seizures, weakness and headaches.  Psychiatric/Behavioral:  Negative for depression, substance abuse and suicidal ideas. The patient is not nervous/anxious.     Current Outpatient Medications:    atorvastatin (LIPITOR) 10 MG tablet, Take 1 tablet (10 mg total) by mouth daily., Disp: 90 tablet, Rfl: 3   Calcium Carb-Cholecalciferol 600-400 MG-UNIT TABS, Take 1 tablet by mouth daily. , Disp: , Rfl:    Multiple Vitamin (MULTIVITAMIN) tablet, Take 1 tablet by mouth daily., Disp: , Rfl:    levothyroxine (SYNTHROID) 75 MCG tablet, Take 1 tablet (75 mcg total) by mouth daily., Disp: 90 tablet, Rfl: 1   lisinopril-hydrochlorothiazide (ZESTORETIC) 20-25 MG tablet, Take 1 tablet by mouth daily., Disp: 90 tablet, Rfl: 1  Allergies  Allergen Reactions   Penicillins Rash    Did it involve swelling of the face/tongue/throat, SOB, or low BP? Unknown Did it involve sudden or severe rash/hives, skin peeling, or any reaction on the inside of your mouth or nose? Ruta Hinds Did you need to seek medical attention at a hospital or doctor's office? No When did it last happen?  childhood If all above answers are NO, may proceed with cephalosporin use.    Sulfa Antibiotics Rash    Past Medical History:  Diagnosis Date   Cataract    Cataracts, bilateral    Hyperlipidemia    Hypertension    Incarcerated umbilical hernia 06/21/2019   Thyroid disease     Past Surgical History:  Procedure Laterality Date   ABDOMINAL HYSTERECTOMY     APPENDECTOMY     BLADDER SURGERY     EYE SURGERY      Cataracts   HERNIA REPAIR     UMBILICAL HERNIA REPAIR N/A 06/22/2019   Procedure: HERNIA REPAIR UMBILICAL ADULT;  Surgeon: Franky Macho, MD;  Location: [facility];  Service: General;  Laterality: N/A;    Family History  Problem Relation Age of Onset   Stroke Mother    Heart disease Mother    Heart disease Father    Stroke Father    Cancer Father    Cancer Sister    Aneurysm Brother     Social History   Socioeconomic History   Marital status: Widowed    Spouse name: Not on file   Number of children: 1   Years of education: 60   Highest education level: Not on file  Occupational History   Occupation: Retired  Tobacco Use   Smoking status: Never   Smokeless tobacco: Never  Vaping Use   Vaping Use: Never used  Substance and Sexual Activity   Alcohol use: Never   Drug use: Never   Sexual activity: Not Currently  Other Topics Concern   Not on file  Social History Narrative  Not on file   Social Determinants of Health   Financial Resource Strain: Not on file  Food Insecurity: Not on file  Transportation Needs: Not on file  Physical Activity: Not on file  Stress: Not on file  Social Connections: Not on file  Intimate Partner Violence: Not on file      Objective:    BP (!) 165/82    Pulse 83    Temp (!) 97.3 F (36.3 C) (Temporal)    Ht 5\' 6"  (1.676 m)    Wt 78.4 kg    SpO2 96%    BMI 27.89 kg/m   Wt Readings from Last 3 Encounters:  12/24/21 172 lb 12.8 oz (78.4 kg)  10/07/21 172 lb (78 kg)  05/21/21 173 lb (78.5 kg)    Physical Exam Vitals reviewed.  Constitutional:      General: She is not in acute distress.    Appearance: Normal appearance. She is overweight. She is not ill-appearing, toxic-appearing or diaphoretic.  HENT:     Head: Normocephalic and atraumatic.     Right Ear: Tympanic membrane, ear canal and external ear normal. There is no impacted cerumen.     Left Ear: Tympanic membrane, ear canal and external ear normal. There is no impacted  cerumen.     Nose: Nose normal. No congestion or rhinorrhea.     Mouth/Throat:     Mouth: Mucous membranes are moist.     Pharynx: Oropharynx is clear. No oropharyngeal exudate or posterior oropharyngeal erythema.  Eyes:     General: No scleral icterus.       Right eye: No discharge.        Left eye: No discharge.     Conjunctiva/sclera: Conjunctivae normal.     Pupils: Pupils are equal, round, and reactive to light.     Comments: Exotropia of left eye.  Cardiovascular:     Rate and Rhythm: Normal rate and regular rhythm.     Heart sounds: Normal heart sounds. No murmur heard.   No friction rub. No gallop.  Pulmonary:     Effort: Pulmonary effort is normal. No respiratory distress.     Breath sounds: Normal breath sounds. No stridor. No wheezing, rhonchi or rales.  Abdominal:     General: Abdomen is flat. Bowel sounds are normal. There is no distension.     Palpations: Abdomen is soft. There is no mass.     Tenderness: There is no abdominal tenderness. There is no guarding or rebound.     Hernia: No hernia is present.  Musculoskeletal:        General: Normal range of motion.     Cervical back: Normal range of motion and neck supple. No rigidity. No muscular tenderness.  Lymphadenopathy:     Cervical: No cervical adenopathy.  Skin:    General: Skin is warm and dry.     Capillary Refill: Capillary refill takes less than 2 seconds.  Neurological:     General: No focal deficit present.     Mental Status: She is alert and oriented to person, place, and time. Mental status is at baseline.  Psychiatric:        Mood and Affect: Mood normal.        Behavior: Behavior normal.        Thought Content: Thought content normal.        Judgment: Judgment normal.    Lab Results  Component Value Date   TSH 1.810 05/21/2021   Lab Results  Component Value Date   WBC 4.7 05/21/2021   HGB 14.1 05/21/2021   HCT 41.1 05/21/2021   MCV 86 05/21/2021   PLT 120 (L) 05/21/2021   Lab Results   Component Value Date   NA 146 (H) 05/21/2021   K 3.5 05/21/2021   CO2 22 05/21/2021   GLUCOSE 94 05/21/2021   BUN 22 05/21/2021   CREATININE 0.88 05/21/2021   BILITOT 0.5 05/21/2021   ALKPHOS 83 05/21/2021   AST 21 05/21/2021   ALT 17 05/21/2021   PROT 7.3 05/21/2021   ALBUMIN 4.2 05/21/2021   CALCIUM 9.4 05/21/2021   ANIONGAP 10 06/24/2019   EGFR 66 05/21/2021   Lab Results  Component Value Date   CHOL 130 05/21/2021   Lab Results  Component Value Date   HDL 44 05/21/2021   Lab Results  Component Value Date   LDLCALC 61 05/21/2021   Lab Results  Component Value Date   TRIG 143 05/21/2021   Lab Results  Component Value Date   CHOLHDL 3.0 05/21/2021   No results found for: HGBA1C

## 2021-12-25 LAB — CMP14+EGFR
ALT: 16 IU/L (ref 0–32)
AST: 26 IU/L (ref 0–40)
Albumin/Globulin Ratio: 1.8 (ref 1.2–2.2)
Albumin: 4.6 g/dL (ref 3.7–4.7)
Alkaline Phosphatase: 88 IU/L (ref 44–121)
BUN/Creatinine Ratio: 17 (ref 12–28)
BUN: 16 mg/dL (ref 8–27)
Bilirubin Total: 0.6 mg/dL (ref 0.0–1.2)
CO2: 25 mmol/L (ref 20–29)
Calcium: 9.4 mg/dL (ref 8.7–10.3)
Chloride: 100 mmol/L (ref 96–106)
Creatinine, Ser: 0.96 mg/dL (ref 0.57–1.00)
Globulin, Total: 2.6 g/dL (ref 1.5–4.5)
Glucose: 94 mg/dL (ref 70–99)
Potassium: 3.8 mmol/L (ref 3.5–5.2)
Sodium: 144 mmol/L (ref 134–144)
Total Protein: 7.2 g/dL (ref 6.0–8.5)
eGFR: 60 mL/min/{1.73_m2} (ref >59)

## 2021-12-25 LAB — CBC WITH DIFFERENTIAL/PLATELET
Basophils Absolute: 0 10*3/uL (ref 0.0–0.2)
Basos: 1 %
EOS (ABSOLUTE): 0 10*3/uL (ref 0.0–0.4)
Eos: 1 %
Hematocrit: 42.4 % (ref 34.0–46.6)
Hemoglobin: 14.4 g/dL (ref 11.1–15.9)
Immature Grans (Abs): 0 10*3/uL (ref 0.0–0.1)
Immature Granulocytes: 0 %
Lymphocytes Absolute: 0.9 10*3/uL (ref 0.7–3.1)
Lymphs: 16 %
MCH: 29.6 pg (ref 26.6–33.0)
MCHC: 34 g/dL (ref 31.5–35.7)
MCV: 87 fL (ref 79–97)
Monocytes Absolute: 0.4 10*3/uL (ref 0.1–0.9)
Monocytes: 7 %
Neutrophils Absolute: 4.1 10*3/uL (ref 1.4–7.0)
Neutrophils: 75 %
Platelets: 118 10*3/uL — ABNORMAL LOW (ref 150–450)
RBC: 4.87 x10E6/uL (ref 3.77–5.28)
RDW: 13.5 % (ref 11.7–15.4)
WBC: 5.5 10*3/uL (ref 3.4–10.8)

## 2021-12-25 LAB — LIPID PANEL
Chol/HDL Ratio: 3.3 ratio (ref 0.0–4.4)
Cholesterol, Total: 137 mg/dL (ref 100–199)
HDL: 41 mg/dL (ref >39)
LDL Chol Calc (NIH): 70 mg/dL (ref 0–99)
Triglycerides: 148 mg/dL (ref 0–149)
VLDL Cholesterol Cal: 26 mg/dL (ref 5–40)

## 2021-12-25 LAB — T4, FREE: Free T4: 1.49 ng/dL (ref 0.82–1.77)

## 2021-12-25 LAB — TSH: TSH: 1.92 u[IU]/mL (ref 0.450–4.500)

## 2022-01-02 ENCOUNTER — Ambulatory Visit (INDEPENDENT_AMBULATORY_CARE_PROVIDER_SITE_OTHER): Payer: Medicare Other

## 2022-01-02 VITALS — Wt 173.0 lb

## 2022-01-02 DIAGNOSIS — Z0000 Encounter for general adult medical examination without abnormal findings: Secondary | ICD-10-CM

## 2022-01-02 NOTE — Progress Notes (Signed)
Subjective:   Hannah [name] is a 81 y.o. female who presents for Medicare Annual (Subsequent) preventive examination.  Virtual Visit via Telephone Note  I connected with  Hannah [name] on 01/02/22 at  2:00 PM EST by telephone and verified that I am speaking with the correct person using two identifiers.  Location: Patient: Home Provider: [facility] Persons participating in the virtual visit: patient/Nurse Health Advisor   I discussed the limitations, risks, security and privacy concerns of performing an evaluation and management service by telephone and the availability of in person appointments. The patient expressed understanding and agreed to proceed.  Interactive audio and video telecommunications were attempted between this nurse and patient, however failed, due to patient having technical difficulties OR patient did not have access to video capability.  We continued and completed visit with audio only.  Some vital signs may be absent or patient reported.   Hannah [name] E Hannah Galli, LPN   Review of Systems     Cardiac Risk Factors include: advanced age (>100men, >81 women);hypertension;dyslipidemia;Other (see comment), Risk factor comments: atherosclerosis     Objective:    Today's Vitals   01/02/22 1401  Weight: 173 lb (78.5 kg)   Body mass index is 27.92 kg/m.  Advanced Directives 01/02/2022 12/31/2020 10/24/2019 06/22/2019 06/20/2019 06/20/2019  Does Patient Have a Medical Advance Directive? No No No No No No  Would patient like information on creating a medical advance directive? No - Patient declined No - Patient declined No - Patient declined - No - Guardian declined -    Current Medications (verified) Outpatient Encounter Medications as of 01/02/2022  Medication Sig   atorvastatin (LIPITOR) 10 MG tablet Take 1 tablet (10 mg total) by mouth daily.   Calcium Carb-Cholecalciferol 600-400 MG-UNIT TABS Take 1 tablet by mouth daily.    levothyroxine (SYNTHROID) 75 MCG tablet Take 1 tablet  (75 mcg total) by mouth daily.   lisinopril-hydrochlorothiazide (ZESTORETIC) 20-25 MG tablet Take 1 tablet by mouth daily.   Multiple Vitamin (MULTIVITAMIN) tablet Take 1 tablet by mouth daily.   No facility-administered encounter medications on file as of 01/02/2022.    Allergies (verified) Penicillins and Sulfa antibiotics   History: Past Medical History:  Diagnosis Date   Cataract    Cataracts, bilateral    Hyperlipidemia    Hypertension    Incarcerated umbilical hernia [id number]   Thyroid disease    Past Surgical History:  Procedure Laterality Date   ABDOMINAL HYSTERECTOMY     APPENDECTOMY     BLADDER SURGERY     EYE SURGERY     Cataracts   HERNIA REPAIR     UMBILICAL HERNIA REPAIR N/A 06/22/2019   Procedure: HERNIA REPAIR UMBILICAL ADULT;  Surgeon: Aviva Signs, MD;  Location: [facility];  Service: General;  Laterality: N/A;   Family History  Problem Relation Age of Onset   Stroke Mother    Heart disease Mother    Heart disease Father    Stroke Father    Cancer Father    Cancer Sister    Aneurysm Brother    Social History   Socioeconomic History   Marital status: Widowed    Spouse name: Not on file   Number of children: 1   Years of education: 7   Highest education level: Not on file  Occupational History   Occupation: Retired  Tobacco Use   Smoking status: Never   Smokeless tobacco: Never  Vaping Use   Vaping Use: Never used  Substance and Sexual Activity  Alcohol use: Never   Drug use: Never   Sexual activity: Not Currently  Other Topics Concern   Not on file  Social History Narrative   Lives alone - duplex - only 2 stairs to come in   Royal is handicap, so she mows his yard and helps him when needed   Very independent - no cane or walker    Daughter lives 5 minutes away   Social Determinants of Health   Financial Resource Strain: Low Risk    Difficulty of Paying Living Expenses: Not very hard  Food Insecurity: No Food Insecurity    Worried About Charity fundraiser in the Last Year: Never true   [place] in the Last Year: Never true  Transportation Needs: No Transportation Needs   Lack of Transportation (Medical): No   Lack of Transportation (Non-Medical): No  Physical Activity: Insufficiently Active   Days of Exercise per Week: 7 days   Minutes of Exercise per Session: 20 min  Stress: No Stress Concern Present   Feeling of Stress : Only a little  Social Connections: Moderately Isolated   Frequency of Communication with Friends and Family: Three times a week   Frequency of Social Gatherings with Friends and Family: Three times a week   Attends Religious Services: More than 4 times per year   Active Member of Clubs or Organizations: No   Attends Archivist Meetings: Never   Marital Status: Widowed    Tobacco Counseling Counseling given: Not Answered   Clinical Intake:  Pre-visit preparation completed: Yes  Pain : No/denies pain     BMI - recorded: 27.92 Nutritional Status: BMI 25 -29 Overweight Nutritional Risks: None Diabetes: No  How often do you need to have someone help you when you read instructions, pamphlets, or other written materials from your doctor or pharmacy?: 1 - Never  Diabetic? no  Interpreter Needed?: No  Information entered by :: Eliane Hammersmith, LPN   Activities of Daily Living In your present state of health, do you have any difficulty performing the following activities: 01/02/2022  Hearing? N  Vision? Y  Comment cataracts  Difficulty concentrating or making decisions? N  Walking or climbing stairs? N  Dressing or bathing? N  Doing errands, shopping? N  Preparing Food and eating ? N  Using the Toilet? N  In the past six months, have you accidently leaked urine? N  Do you have problems with loss of bowel control? Y  Comment intermittent since surgery - rectocele  Managing your Medications? N  Managing your Finances? N  Housekeeping or managing your  Housekeeping? N  Some recent data might be hidden    Patient Care Team: Loman Brooklyn, FNP as PCP - General (Family Medicine) Leticia Clas, OD (Optometry)  Indicate any recent Medical Services you may have received from other than [facility] providers in the past year (date may be approximate).     Assessment:   This is a routine wellness examination for [place].  Hearing/Vision screen Hearing Screening - Comments:: Denies hearing difficulties   Vision Screening - Comments:: Wears rx glasses - up to date with routine eye exams with Rosana Hoes in [place] issues and exercise activities discussed: Current Exercise Habits: Home exercise routine, Type of exercise: walking;Other - see comments (house work, yard work), Time (Minutes): 20, Frequency (Times/Week): 7, Weekly Exercise (Minutes/Week): 140, Intensity: Mild, Exercise limited by: orthopedic condition(s)   Goals Addressed  This Visit's Progress    AWV   On track    10/24/2019 AWV Goal: Fall Prevention  Over the next year, patient will decrease their risk for falls by: Using assistive devices, such as a cane or walker, as needed Identifying fall risks within their home and correcting them by: Removing throw rugs Adding handrails to stairs or ramps Removing clutter and keeping a clear pathway throughout the home Increasing light, especially at night Adding shower handles/bars Raising toilet seat Identifying potential personal risk factors for falls: Medication side effects Incontinence/urgency Vestibular dysfunction Hearing loss Musculoskeletal disorders Neurological disorders Orthostatic hypotension       AWV   On track    01/02/2022 AWV Goal: Fall Prevention  Over the next year, patient will decrease their risk for falls by: Using assistive devices, such as a cane or walker, as needed Identifying fall risks within their home and correcting them by: Removing throw rugs Adding handrails to stairs or  ramps Removing clutter and keeping a clear pathway throughout the home Increasing light, especially at night Adding shower handles/bars Raising toilet seat Identifying potential personal risk factors for falls: Medication side effects Incontinence/urgency Vestibular dysfunction Hearing loss Musculoskeletal disorders Neurological disorders Orthostatic hypotension  12/31/2020 AWV Goal: Exercise for General Health  Patient will verbalize understanding of the benefits of increased physical activity: Exercising regularly is important. It will improve your overall fitness, flexibility, and endurance. Regular exercise also will improve your overall health. It can help you control your weight, reduce stress, and improve your bone density. Over the next year, patient will increase physical activity as tolerated with a goal of at least 150 minutes of moderate physical activity per week.  You can tell that you are exercising at a moderate intensity if your heart starts beating faster and you start breathing faster but can still hold a conversation. Moderate-intensity exercise ideas include: Walking 1 mile (1.6 km) in about 15 minutes Biking Hiking Golfing Dancing Water aerobics Patient will verbalize understanding of everyday activities that increase physical activity by providing examples like the following: Yard work, such as: Sales promotion account executive Gardening Washing windows or floors Patient will be able to explain general safety guidelines for exercising:  Before you start a new exercise program, talk with your health care provider. Do not exercise so much that you hurt yourself, feel dizzy, or get very short of breath. Wear comfortable clothes and wear shoes with good support. Drink plenty of water while you exercise to prevent dehydration or heat stroke. Work out until your breathing and your heartbeat get  faster.        Depression Screen PHQ 2/9 Scores 01/02/2022 12/24/2021 05/21/2021 12/31/2020 12/19/2020 10/19/2020 05/16/2020  PHQ - 2 Score 0 0 3 0 0 0 0  PHQ- 9 Score 0 0 3 - - - -    Fall Risk Fall Risk  01/02/2022 12/24/2021 05/21/2021 12/31/2020 12/19/2020  Falls in the past year? 0 0 0 0 0  Number falls in past yr: 0 - - - -  Injury with Fall? 0 - - - -  Risk for fall due to : Orthopedic patient;Impaired vision - - - -  Follow up Falls prevention discussed - - - -    FALL RISK PREVENTION PERTAINING TO THE HOME:  Any stairs in or around the home? Yes  If so, are there any without handrails? No  Home free of loose throw rugs in  walkways, pet beds, electrical cords, etc? Yes  Adequate lighting in your home to reduce risk of falls? Yes   ASSISTIVE DEVICES UTILIZED TO PREVENT FALLS:  Life alert? No  Use of a cane, walker or w/c? No  Grab bars in the bathroom? Yes  Shower chair or bench in shower? Yes  Elevated toilet seat or a handicapped toilet? Yes   TIMED UP AND GO:  Was the test performed? No . Telephonic visit  Cognitive Function:     6CIT Screen 01/02/2022 12/31/2020 10/24/2019  What Year? 0 points 0 points 0 points  What month? 0 points 0 points 0 points  What time? 0 points 0 points 0 points  Count back from 20 0 points 0 points 2 points  Months in reverse 0 points 0 points 0 points  Repeat phrase 2 points 2 points 4 points  Total Score 2 2 6     Immunizations Immunization History  Administered Date(s) Administered   Fluad Quad(high Dose 65+) 09/10/2016, 08/10/2017, 09/09/2018, 09/08/2019, 09/07/2020, 09/10/2021   Influenza Split 09/12/2020   Influenza, High Dose Seasonal PF 09/10/2016, 08/10/2017, 09/09/2018   Influenza, Seasonal, Injecte, Preservative Fre 09/14/2014, 09/03/2015   Influenza-Unspecified 09/14/2014, 09/03/2015   Moderna Sars-Covid-2 Vaccination 09/13/2019, 02/22/2020, 03/19/2020, 10/22/2020   Pneumococcal Conjugate-13 08/03/2015   Pneumococcal  Polysaccharide-23 12/09/2016   Tdap 08/03/2015    TDAP status: Up to date  Flu Vaccine status: Up to date  Pneumococcal vaccine status: Up to date  Covid-19 vaccine status: Completed vaccines  Qualifies for Shingles Vaccine? Yes   Zostavax completed No   Shingrix Completed?: No.    Education has been provided regarding the importance of this vaccine. Patient has been advised to call insurance company to determine out of pocket expense if they have not yet received this vaccine. Advised may also receive vaccine at local pharmacy or Health Dept. Verbalized acceptance and understanding.  Screening Tests Health Maintenance  Topic Date Due   COVID-19 Vaccine (5 - Booster for Moderna series) 01/09/2022 (Originally 12/17/2020)   Zoster Vaccines- Shingrix (1 of 2) 03/24/2022 (Originally [birth year]1991)   DEXA SCAN  12/24/2024   TETANUS/TDAP  08/02/2025   Pneumonia Vaccine 11+ Years old  Completed   INFLUENZA VACCINE  Completed   HPV VACCINES  Aged Out    Health Maintenance  There are no preventive care reminders to display for this patient.  Colorectal cancer screening: No longer required.   Mammogram status: No longer required due to age.  Bone Density status: Completed 12/24/2021. Results reflect: Bone density results: NORMAL. Repeat every 3 years.  Lung Cancer Screening: (Low Dose CT Chest recommended if Age 42-80 years, 30 pack-year currently smoking OR have quit w/in 15years.) does not qualify.  Additional Screening:  Hepatitis C Screening: does not qualify  Vision Screening: Recommended annual ophthalmology exams for early detection of glaucoma and other disorders of the eye. Is the patient up to date with their annual eye exam?  Yes  Who is the provider or what is the name of the office in which the patient attends annual eye exams? [facility] in [place] If pt is not established with a provider, would they like to be referred to a provider to establish care? No .   Dental Screening:  Recommended annual dental exams for proper oral hygiene  Community Resource Referral / Chronic Care Management: CRR required this visit?  No   CCM required this visit?  No      Plan:     I have personally reviewed  and noted the following in the patients chart:   Medical and social history Use of alcohol, tobacco or illicit drugs  Current medications and supplements including opioid prescriptions.  Functional ability and status Nutritional status Physical activity Advanced directives List of other physicians Hospitalizations, surgeries, and ER visits in previous 12 months Vitals Screenings to include cognitive, depression, and falls Referrals and appointments  In addition, I have reviewed and discussed with patient certain preventive protocols, quality metrics, and best practice recommendations. A written personalized care plan for preventive services as well as general preventive health recommendations were provided to patient.     Sandrea Hammond, LPN   [id number]   Nurse Notes: None

## 2022-01-02 NOTE — Patient Instructions (Signed)
Hannah [name] , Thank you for taking time to come for your Medicare Wellness Visit. I appreciate your ongoing commitment to your health goals. Please review the following plan we discussed and let me know if I can assist you in the future.   Screening recommendations/referrals: Colonoscopy: No longer required Mammogram: No longer required Bone Density: Done 12/24/2021 - repeat in 3 years Recommended yearly ophthalmology/optometry visit for glaucoma screening and checkup Recommended yearly dental visit for hygiene and checkup  Vaccinations: Influenza vaccine: Done 09/10/2021 - Repeat annually  Pneumococcal vaccine: Done 08/03/2015 & 12/09/2016 Tdap vaccine: Done 08/03/2015 - Repeat in 10 years Shingles vaccine: Declined - recommend 2 doses 2 months apart   Covid-19:Done 02/22/2020, 03/19/2020, 09/12/2020, & 10/22/2020  Advanced directives: Advance directive discussed with you today. Even though you declined this today, please call our office should you change your mind, and we can give you the proper paperwork for you to fill out.   Conditions/risks identified: Aim for 30 minutes of exercise or brisk walking each day, drink 6-8 glasses of water and eat lots of fruits and vegetables.   Next appointment: Follow up in one year for your annual wellness visit    Preventive Care 65 Years and Older, Female Preventive care refers to lifestyle choices and visits with your health care provider that can promote health and wellness. What does preventive care include? A yearly physical exam. This is also called an annual well check. Dental exams once or twice a year. Routine eye exams. Ask your health care provider how often you should have your eyes checked. Personal lifestyle choices, including: Daily care of your teeth and gums. Regular physical activity. Eating a healthy diet. Avoiding tobacco and drug use. Limiting alcohol use. Practicing safe sex. Taking low-dose aspirin every day. Taking vitamin  and mineral supplements as recommended by your health care provider. What happens during an annual well check? The services and screenings done by your health care provider during your annual well check will depend on your age, overall health, lifestyle risk factors, and family history of disease. Counseling  Your health care provider may ask you questions about your: Alcohol use. Tobacco use. Drug use. Emotional well-being. Home and relationship well-being. Sexual activity. Eating habits. History of falls. Memory and ability to understand (cognition). Work and work Astronomer. Reproductive health. Screening  You may have the following tests or measurements: Height, weight, and BMI. Blood pressure. Lipid and cholesterol levels. These may be checked every 5 years, or more frequently if you are over 16 years old. Skin check. Lung cancer screening. You may have this screening every year starting at age 2 if you have a 30-pack-year history of smoking and currently smoke or have quit within the past 15 years. Fecal occult blood test (FOBT) of the stool. You may have this test every year starting at age 27. Flexible sigmoidoscopy or colonoscopy. You may have a sigmoidoscopy every 5 years or a colonoscopy every 10 years starting at age 61. Hepatitis C blood test. Hepatitis B blood test. Sexually transmitted disease (STD) testing. Diabetes screening. This is done by checking your blood sugar (glucose) after you have not eaten for a while (fasting). You may have this done every 1-3 years. Bone density scan. This is done to screen for osteoporosis. You may have this done starting at age 60. Mammogram. This may be done every 1-2 years. Talk to your health care provider about how often you should have regular mammograms. Talk with your health care provider about  your test results, treatment options, and if necessary, the need for more tests. Vaccines  Your health care provider may recommend  certain vaccines, such as: Influenza vaccine. This is recommended every year. Tetanus, diphtheria, and acellular pertussis (Tdap, Td) vaccine. You may need a Td booster every 10 years. Zoster vaccine. You may need this after age 33. Pneumococcal 13-valent conjugate (PCV13) vaccine. One dose is recommended after age 42. Pneumococcal polysaccharide (PPSV23) vaccine. One dose is recommended after age 70. Talk to your health care provider about which screenings and vaccines you need and how often you need them. This information is not intended to replace advice given to you by your health care provider. Make sure you discuss any questions you have with your health care provider. Document Released: 12/14/2015 Document Revised: 08/06/2016 Document Reviewed: 09/18/2015 Elsevier Interactive Patient Education  2017 [employer] Prevention in the Home Falls can cause injuries. They can happen to people of all ages. There are many things you can do to make your home safe and to help prevent falls. What can I do on the outside of my home? Regularly fix the edges of walkways and driveways and fix any cracks. Remove anything that might make you trip as you walk through a door, such as a raised step or threshold. Trim any bushes or trees on the path to your home. Use bright outdoor lighting. Clear any walking paths of anything that might make someone trip, such as rocks or tools. Regularly check to see if handrails are loose or broken. Make sure that both sides of any steps have handrails. Any raised decks and porches should have guardrails on the edges. Have any leaves, snow, or ice cleared regularly. Use sand or salt on walking paths during winter. Clean up any spills in your garage right away. This includes oil or grease spills. What can I do in the bathroom? Use night lights. Install grab bars by the toilet and in the tub and shower. Do not use towel bars as grab bars. Use non-skid mats or  decals in the tub or shower. If you need to sit down in the shower, use a plastic, non-slip stool. Keep the floor dry. Clean up any water that spills on the floor as soon as it happens. Remove soap buildup in the tub or shower regularly. Attach bath mats securely with double-sided non-slip rug tape. Do not have throw rugs and other things on the floor that can make you trip. What can I do in the bedroom? Use night lights. Make sure that you have a light by your bed that is easy to reach. Do not use any sheets or blankets that are too big for your bed. They should not hang down onto the floor. Have a firm chair that has side arms. You can use this for support while you get dressed. Do not have throw rugs and other things on the floor that can make you trip. What can I do in the kitchen? Clean up any spills right away. Avoid walking on wet floors. Keep items that you use a lot in easy-to-reach places. If you need to reach something above you, use a strong step stool that has a grab bar. Keep electrical cords out of the way. Do not use floor polish or wax that makes floors slippery. If you must use wax, use non-skid floor wax. Do not have throw rugs and other things on the floor that can make you trip. What can I do with  my stairs? Do not leave any items on the stairs. Make sure that there are handrails on both sides of the stairs and use them. Fix handrails that are broken or loose. Make sure that handrails are as long as the stairways. Check any carpeting to make sure that it is firmly attached to the stairs. Fix any carpet that is loose or worn. Avoid having throw rugs at the top or bottom of the stairs. If you do have throw rugs, attach them to the floor with carpet tape. Make sure that you have a light switch at the top of the stairs and the bottom of the stairs. If you do not have them, ask someone to add them for you. What else can I do to help prevent falls? Wear shoes that: Do not  have high heels. Have rubber bottoms. Are comfortable and fit you well. Are closed at the toe. Do not wear sandals. If you use a stepladder: Make sure that it is fully opened. Do not climb a closed stepladder. Make sure that both sides of the stepladder are locked into place. Ask someone to hold it for you, if possible. Clearly mark and make sure that you can see: Any grab bars or handrails. First and last steps. Where the edge of each step is. Use tools that help you move around (mobility aids) if they are needed. These include: Canes. Walkers. Scooters. Crutches. Turn on the lights when you go into a dark area. Replace any light bulbs as soon as they burn out. Set up your furniture so you have a clear path. Avoid moving your furniture around. If any of your floors are uneven, fix them. If there are any pets around you, be aware of where they are. Review your medicines with your doctor. Some medicines can make you feel dizzy. This can increase your chance of falling. Ask your doctor what other things that you can do to help prevent falls. This information is not intended to replace advice given to you by your health care provider. Make sure you discuss any questions you have with your health care provider. Document Released: 09/13/2009 Document Revised: 04/24/2016 Document Reviewed: 12/22/2014 Elsevier Interactive Patient Education  2017 [employer].

## 2022-01-31 ENCOUNTER — Other Ambulatory Visit: Payer: Self-pay | Admitting: Family Medicine

## 2022-01-31 DIAGNOSIS — E7841 Elevated Lipoprotein(a): Secondary | ICD-10-CM

## 2022-02-04 ENCOUNTER — Ambulatory Visit: Payer: Medicare Other | Admitting: Family Medicine

## 2022-02-21 ENCOUNTER — Ambulatory Visit (INDEPENDENT_AMBULATORY_CARE_PROVIDER_SITE_OTHER): Payer: Medicare Other | Admitting: Family Medicine

## 2022-02-21 ENCOUNTER — Encounter: Payer: Self-pay | Admitting: Family Medicine

## 2022-02-21 VITALS — BP 147/74 | HR 93 | Temp 98.1°F | Ht 66.0 in | Wt 172.0 lb

## 2022-02-21 DIAGNOSIS — I10 Essential (primary) hypertension: Secondary | ICD-10-CM

## 2022-02-21 LAB — BMP8+EGFR
BUN/Creatinine Ratio: 20 (ref 12–28)
BUN: 20 mg/dL (ref 8–27)
CO2: 25 mmol/L (ref 20–29)
Calcium: 9.5 mg/dL (ref 8.7–10.3)
Chloride: 103 mmol/L (ref 96–106)
Creatinine, Ser: 1 mg/dL (ref 0.57–1.00)
Glucose: 120 mg/dL — ABNORMAL HIGH (ref 70–99)
Potassium: 3.5 mmol/L (ref 3.5–5.2)
Sodium: 143 mmol/L (ref 134–144)
eGFR: 57 mL/min/{1.73_m2} — ABNORMAL LOW (ref >59)

## 2022-02-21 NOTE — Progress Notes (Signed)
? ?Assessment & Plan:  ?1. Essential hypertension ?Well controlled on current regimen. Continue exercising and low salt diet. ?- BMP8+EGFR ? ? ?Return on/after 12/24/2022, for annual physical. ? ?Hannah Boston, MSN, APRN, FNP-C ?[facility] ? ?Subjective:  ? ? Patient ID: Hannah [name], female    DOB: November 12, 1941, 81 y.o.   MRN: [id number] ? ?Patient Care Team: ?Gwenlyn Fudge, FNP as PCP - General (Family Medicine) ?Desiree Lucy, OD (Optometry)  ? ?Chief Complaint:  ?Chief Complaint  ?Patient presents with  ? Hypertension  ?  6 week follow up   ? ? ?HPI: ?Hannah [name] is a 81 y.o. female presenting on 02/21/2022 for Hypertension (6 week follow up ) ? ?Hypertension: takes medication as prescribed. Eats a low salt diet. She has been walking since the weather has warmed up. She does not monitor her blood pressure at home, but has checked it at the drug store and reports similar readings to what we got here. ? ?New complaints: ?None ? ? ?Social history: ? ?Relevant past medical, surgical, family and social history reviewed and updated as indicated. Interim medical history since our last visit reviewed. ? ?Allergies and medications reviewed and updated. ? ?DATA REVIEWED: CHART IN EPIC ? ?ROS: Negative unless specifically indicated above in HPI.  ? ? ?Current Outpatient Medications:  ?  atorvastatin (LIPITOR) 10 MG tablet, TAKE 1 TABLET BY MOUTH EVERY DAY, Disp: 90 tablet, Rfl: 0 ?  Calcium Carb-Cholecalciferol 600-400 MG-UNIT TABS, Take 1 tablet by mouth daily. , Disp: , Rfl:  ?  levothyroxine (SYNTHROID) 75 MCG tablet, Take 1 tablet (75 mcg total) by mouth daily., Disp: 90 tablet, Rfl: 1 ?  lisinopril-hydrochlorothiazide (ZESTORETIC) 20-25 MG tablet, Take 1 tablet by mouth daily., Disp: 90 tablet, Rfl: 1 ?  Multiple Vitamin (MULTIVITAMIN) tablet, Take 1 tablet by mouth daily., Disp: , Rfl:   ? ?Allergies  ?Allergen Reactions  ? Penicillins Rash  ?  Did it involve swelling of the  face/tongue/throat, SOB, or low BP? Unknown ?Did it involve sudden or severe rash/hives, skin peeling, or any reaction on the inside of your mouth or nose? Unknown-rash ?Did you need to seek medical attention at a hospital or doctor's office? No ?When did it last happen?  childhood ?If all above answers are ?NO?, may proceed with cephalosporin use. ?  ? Sulfa Antibiotics Rash  ? ?Past Medical History:  ?Diagnosis Date  ? Cataract   ? Cataracts, bilateral   ? Hyperlipidemia   ? Hypertension   ? Incarcerated umbilical hernia 06/21/2019  ? Thyroid disease   ?  ?Past Surgical History:  ?Procedure Laterality Date  ? ABDOMINAL HYSTERECTOMY    ? APPENDECTOMY    ? BLADDER SURGERY    ? EYE SURGERY    ? Cataracts  ? HERNIA REPAIR    ? UMBILICAL HERNIA REPAIR N/A 06/22/2019  ? Procedure: HERNIA REPAIR UMBILICAL ADULT;  Surgeon: Franky Macho, MD;  Location: [facility];  Service: General;  Laterality: N/A;  ?  ?Social History  ? ?Socioeconomic History  ? Marital status: Widowed  ?  Spouse name: Not on file  ? Number of children: 1  ? Years of education: 4  ? Highest education level: Not on file  ?Occupational History  ? Occupation: Retired  ?Tobacco Use  ? Smoking status: Never  ? Smokeless tobacco: Never  ?Vaping Use  ? Vaping Use: Never used  ?Substance and Sexual Activity  ? Alcohol use: Never  ? Drug use: Never  ? Sexual  activity: Not Currently  ?Other Topics Concern  ? Not on file  ?Social History Narrative  ? Lives alone - duplex - only 2 stairs to come in  ? Neighbor is handicap, so she mows his yard and helps him when needed  ? Very independent - no cane or walker   ? Daughter lives 5 minutes away  ? ?Social Determinants of Health  ? ?Financial Resource Strain: Low Risk   ? Difficulty of Paying Living Expenses: Not very hard  ?Food Insecurity: No Food Insecurity  ? Worried About Programme researcher, broadcasting/film/video in the Last Year: Never true  ? Ran Out of Food in the Last Year: Never true  ?Transportation Needs: No Transportation Needs  ?  Lack of Transportation (Medical): No  ? Lack of Transportation (Non-Medical): No  ?Physical Activity: Insufficiently Active  ? Days of Exercise per Week: 7 days  ? Minutes of Exercise per Session: 20 min  ?Stress: No Stress Concern Present  ? Feeling of Stress : Only a little  ?Social Connections: Moderately Isolated  ? Frequency of Communication with Friends and Family: Three times a week  ? Frequency of Social Gatherings with Friends and Family: Three times a week  ? Attends Religious Services: More than 4 times per year  ? Active Member of Clubs or Organizations: No  ? Attends Banker Meetings: Never  ? Marital Status: Widowed  ?Intimate Partner Violence: Not At Risk  ? Fear of Current or Ex-Partner: No  ? Emotionally Abused: No  ? Physically Abused: No  ? Sexually Abused: No  ?  ? ?   ?Objective:  ?  ?BP (!) 148/78   Pulse 93   Temp 98.1 ?F (36.7 ?C) (Temporal)   Ht 5\' 6"  (1.676 m)   Wt 172 lb (78 kg)   SpO2 97%   BMI 27.76 kg/m?  ? ?Wt Readings from Last 3 Encounters:  ?02/21/22 172 lb (78 kg)  ?01/02/22 173 lb (78.5 kg)  ?12/24/21 172 lb 12.8 oz (78.4 kg)  ? ? ?Physical Exam ?Vitals reviewed.  ?Constitutional:   ?   General: She is not in acute distress. ?   Appearance: Normal appearance. She is overweight. She is not ill-appearing, toxic-appearing or diaphoretic.  ?HENT:  ?   Head: Normocephalic and atraumatic.  ?Eyes:  ?   General: No scleral icterus.    ?   Right eye: No discharge.     ?   Left eye: No discharge.  ?   Conjunctiva/sclera: Conjunctivae normal.  ?Cardiovascular:  ?   Rate and Rhythm: Normal rate and regular rhythm.  ?   Heart sounds: Normal heart sounds. No murmur heard. ?  No friction rub. No gallop.  ?Pulmonary:  ?   Effort: Pulmonary effort is normal. No respiratory distress.  ?   Breath sounds: Normal breath sounds. No stridor. No wheezing, rhonchi or rales.  ?Musculoskeletal:     ?   General: Normal range of motion.  ?   Cervical back: Normal range of motion.  ?Skin: ?    General: Skin is warm and dry.  ?   Capillary Refill: Capillary refill takes less than 2 seconds.  ?Neurological:  ?   General: No focal deficit present.  ?   Mental Status: She is alert and oriented to person, place, and time. Mental status is at baseline.  ?Psychiatric:     ?   Mood and Affect: Mood normal.     ?   Behavior: Behavior  normal.     ?   Thought Content: Thought content normal.     ?   Judgment: Judgment normal.  ? ? ?Lab Results  ?Component Value Date  ? TSH 1.920 12/24/2021  ? ?Lab Results  ?Component Value Date  ? WBC 5.5 12/24/2021  ? HGB 14.4 12/24/2021  ? HCT 42.4 12/24/2021  ? MCV 87 12/24/2021  ? PLT 118 (L) 12/24/2021  ? ?Lab Results  ?Component Value Date  ? NA 144 12/24/2021  ? K 3.8 12/24/2021  ? CO2 25 12/24/2021  ? GLUCOSE 94 12/24/2021  ? BUN 16 12/24/2021  ? CREATININE 0.96 12/24/2021  ? BILITOT 0.6 12/24/2021  ? ALKPHOS 88 12/24/2021  ? AST 26 12/24/2021  ? ALT 16 12/24/2021  ? PROT 7.2 12/24/2021  ? ALBUMIN 4.6 12/24/2021  ? CALCIUM 9.4 12/24/2021  ? ANIONGAP 10 06/24/2019  ? EGFR 60 12/24/2021  ? ?Lab Results  ?Component Value Date  ? CHOL 137 12/24/2021  ? ?Lab Results  ?Component Value Date  ? HDL 41 12/24/2021  ? ?Lab Results  ?Component Value Date  ? LDLCALC 70 12/24/2021  ? ?Lab Results  ?Component Value Date  ? TRIG 148 12/24/2021  ? ?Lab Results  ?Component Value Date  ? CHOLHDL 3.3 12/24/2021  ? ?No results found for: HGBA1C ? ?   ? ? ? ? ?

## 2022-04-30 ENCOUNTER — Other Ambulatory Visit: Payer: Self-pay | Admitting: Family Medicine

## 2022-04-30 DIAGNOSIS — E7841 Elevated Lipoprotein(a): Secondary | ICD-10-CM

## 2022-05-30 ENCOUNTER — Other Ambulatory Visit: Payer: Self-pay | Admitting: Family Medicine

## 2022-05-30 DIAGNOSIS — E039 Hypothyroidism, unspecified: Secondary | ICD-10-CM

## 2022-06-12 ENCOUNTER — Encounter: Payer: Self-pay | Admitting: Family Medicine

## 2022-06-12 ENCOUNTER — Ambulatory Visit (INDEPENDENT_AMBULATORY_CARE_PROVIDER_SITE_OTHER): Payer: Medicare Other | Admitting: Family Medicine

## 2022-06-12 VITALS — BP 139/62 | HR 92 | Temp 97.1°F | Ht 66.0 in | Wt 170.4 lb

## 2022-06-12 DIAGNOSIS — R300 Dysuria: Secondary | ICD-10-CM | POA: Diagnosis not present

## 2022-06-12 DIAGNOSIS — N3001 Acute cystitis with hematuria: Secondary | ICD-10-CM

## 2022-06-12 LAB — URINALYSIS, ROUTINE W REFLEX MICROSCOPIC
Bilirubin, UA: NEGATIVE
Glucose, UA: NEGATIVE
Ketones, UA: NEGATIVE
Nitrite, UA: NEGATIVE
Specific Gravity, UA: 1.015 (ref 1.005–1.030)
Urobilinogen, Ur: 0.2 mg/dL (ref 0.2–1.0)
pH, UA: 7 (ref 5.0–7.5)

## 2022-06-12 LAB — MICROSCOPIC EXAMINATION
Renal Epithel, UA: NONE SEEN /hpf
WBC, UA: >30 /hpf — AB (ref 0–5)

## 2022-06-12 MED ORDER — PHENAZOPYRIDINE HCL 200 MG PO TABS: 200.0000 mg | ORAL_TABLET | Freq: Three times a day (TID) | ORAL | 0 refills | Status: DC | PRN

## 2022-06-12 MED ORDER — CEPHALEXIN 500 MG PO CAPS: 500.0000 mg | ORAL_CAPSULE | Freq: Two times a day (BID) | ORAL | 0 refills | Status: AC

## 2022-06-12 NOTE — Progress Notes (Signed)
Assessment & Plan:  1. Acute cystitis with hematuria Education provided on UTIs. Encouraged adequate hydration.  - Urine Culture - cephALEXin (KEFLEX) 500 MG capsule; Take 1 capsule (500 mg total) by mouth 2 (two) times daily for 7 days.  Dispense: 14 capsule; Refill: 0 - phenazopyridine (PYRIDIUM) 200 MG tablet; Take 1 tablet (200 mg total) by mouth 3 (three) times daily as needed for pain.  Dispense: 10 tablet; Refill: 0  2. Dysuria - Urinalysis, Routine w reflex microscopic - Urine dipstick shows positive for RBC's, positive for protein, and positive for leukocytes.  Micro exam: >30 WBC's per HPF, 0-2 RBC's per HPF, and many bacteria.   Follow up plan: Return if symptoms worsen or fail to improve.  [name], MSN, APRN, FNP-C [facility]  Subjective:   Patient ID: Hannah [name], female    DOB: Aug 31, 1941, 81 y.o.   MRN: [id number]  HPI: Hannah [name] is a 81 y.o. female presenting on 06/12/2022 for Dysuria (X 1 day)  Patient complains of dysuria and frequency. She has had symptoms for 1 day. Patient also complains of back pain. Patient denies fever. Patient does have a history of recurrent UTI.  Patient does not have a history of pyelonephritis.    ROS: Negative unless specifically indicated above in HPI.   Relevant past medical history reviewed and updated as indicated.   Allergies and medications reviewed and updated.   Current Outpatient Medications:    atorvastatin (LIPITOR) 10 MG tablet, TAKE 1 TABLET BY MOUTH EVERY DAY, Disp: 90 tablet, Rfl: 0   Calcium Carb-Cholecalciferol 600-400 MG-UNIT TABS, Take 1 tablet by mouth daily. , Disp: , Rfl:    levothyroxine (SYNTHROID) 75 MCG tablet, TAKE 1 TABLET BY MOUTH EVERY DAY, Disp: 90 tablet, Rfl: 1   lisinopril-hydrochlorothiazide (ZESTORETIC) 20-25 MG tablet, Take 1 tablet by mouth daily., Disp: 90 tablet, Rfl: 1   Multiple Vitamin (MULTIVITAMIN) tablet, Take 1 tablet by mouth daily., Disp: , Rfl:    Allergies  Allergen Reactions   Penicillins Rash    Did it involve swelling of the face/tongue/throat, SOB, or low BP? Unknown Did it involve sudden or severe rash/hives, skin peeling, or any reaction on the inside of your mouth or nose? Ruta Hinds Did you need to seek medical attention at a hospital or doctor's office? No When did it last happen?  childhood If all above answers are "NO", may proceed with cephalosporin use.    Sulfa Antibiotics Rash    Objective:   BP 139/62   Pulse 92   Temp (!) 97.1 F (36.2 C) (Temporal)   Ht 5\' 6"  (1.676 m)   Wt 170 lb 6.4 oz (77.3 kg)   SpO2 97%   BMI 27.50 kg/m    Physical Exam Vitals reviewed.  Constitutional:      General: She is not in acute distress.    Appearance: Normal appearance. She is not ill-appearing, toxic-appearing or diaphoretic.  HENT:     Head: Normocephalic and atraumatic.  Eyes:     General: No scleral icterus.       Right eye: No discharge.        Left eye: No discharge.     Conjunctiva/sclera: Conjunctivae normal.  Cardiovascular:     Rate and Rhythm: Normal rate.  Pulmonary:     Effort: Pulmonary effort is normal. No respiratory distress.  Musculoskeletal:        General: Normal range of motion.     Cervical back: Normal range of motion.  Skin:    General: Skin is warm and dry.     Capillary Refill: Capillary refill takes less than 2 seconds.  Neurological:     General: No focal deficit present.     Mental Status: She is alert and oriented to person, place, and time. Mental status is at baseline.  Psychiatric:        Mood and Affect: Mood normal.        Behavior: Behavior normal.        Thought Content: Thought content normal.        Judgment: Judgment normal.

## 2022-06-15 LAB — URINE CULTURE

## 2022-06-18 ENCOUNTER — Other Ambulatory Visit: Payer: Self-pay | Admitting: Family Medicine

## 2022-06-18 DIAGNOSIS — I10 Essential (primary) hypertension: Secondary | ICD-10-CM

## 2022-07-17 ENCOUNTER — Encounter: Payer: Self-pay | Admitting: *Deleted

## 2022-07-24 ENCOUNTER — Ambulatory Visit (INDEPENDENT_AMBULATORY_CARE_PROVIDER_SITE_OTHER): Payer: Medicare Other | Admitting: Family Medicine

## 2022-07-24 ENCOUNTER — Encounter: Payer: Self-pay | Admitting: Family Medicine

## 2022-07-24 VITALS — BP 137/68 | HR 94 | Temp 98.4°F | Ht 66.0 in | Wt 169.0 lb

## 2022-07-24 DIAGNOSIS — N390 Urinary tract infection, site not specified: Secondary | ICD-10-CM | POA: Diagnosis not present

## 2022-07-24 DIAGNOSIS — R300 Dysuria: Secondary | ICD-10-CM

## 2022-07-24 LAB — URINALYSIS, ROUTINE W REFLEX MICROSCOPIC
Bilirubin, UA: NEGATIVE
Glucose, UA: NEGATIVE
Ketones, UA: NEGATIVE
Nitrite, UA: POSITIVE — AB
Specific Gravity, UA: 1.015 (ref 1.005–1.030)
Urobilinogen, Ur: 0.2 mg/dL (ref 0.2–1.0)
pH, UA: 7 (ref 5.0–7.5)

## 2022-07-24 LAB — MICROSCOPIC EXAMINATION
Renal Epithel, UA: NONE SEEN /hpf
WBC, UA: >30 /hpf — AB (ref 0–5)

## 2022-07-24 MED ORDER — CIPROFLOXACIN HCL 500 MG PO TABS: 500.0000 mg | ORAL_TABLET | Freq: Two times a day (BID) | ORAL | 0 refills | Status: AC

## 2022-07-24 NOTE — Progress Notes (Signed)
Subjective:  Patient ID: Hannah [name], female    DOB: Aug 04, 1941, 81 y.o.   MRN: [id number]  Patient Care Team: Gwenlyn Fudge, FNP as PCP - General (Family Medicine) Desiree Lucy, OD (Optometry)   Chief Complaint:  Dysuria   HPI: Hannah [name] is a 81 y.o. female presenting on 07/24/2022 for Dysuria   Dysuria  This is a recurrent problem. The current episode started in the past 7 days. The problem occurs every urination. The problem has been gradually worsening. The quality of the pain is described as burning and aching. The pain is moderate. There has been no fever. She is Not sexually active. There is No history of pyelonephritis. Associated symptoms include frequency and urgency. Pertinent negatives include no chills, discharge, flank pain, hematuria, hesitancy, nausea, possible pregnancy, sweats or vomiting. She has tried increased fluids for the symptoms. The treatment provided no relief. There is no history of recurrent UTIs.     Relevant past medical, surgical, family, and social history reviewed and updated as indicated.  Allergies and medications reviewed and updated. Data reviewed: Chart in Epic.   Past Medical History:  Diagnosis Date   Cataract    Cataracts, bilateral    Hyperlipidemia    Hypertension    Incarcerated umbilical hernia 06/21/2019   Thyroid disease     Past Surgical History:  Procedure Laterality Date   ABDOMINAL HYSTERECTOMY     APPENDECTOMY     BLADDER SURGERY     EYE SURGERY     Cataracts   HERNIA REPAIR     UMBILICAL HERNIA REPAIR N/A 06/22/2019   Procedure: HERNIA REPAIR UMBILICAL ADULT;  Surgeon: Franky Macho, MD;  Location: [facility];  Service: General;  Laterality: N/A;    Social History   Socioeconomic History   Marital status: Widowed    Spouse name: Not on file   Number of children: 1   Years of education: 5   Highest education level: Not on file  Occupational History   Occupation: Retired  Tobacco Use   Smoking  status: Never   Smokeless tobacco: Never  Vaping Use   Vaping Use: Never used  Substance and Sexual Activity   Alcohol use: Never   Drug use: Never   Sexual activity: Not Currently  Other Topics Concern   Not on file  Social History Narrative   Lives alone - duplex - only 2 stairs to come in   Neighbor is handicap, so she mows his yard and helps him when needed   Very independent - no cane or walker    Daughter lives 5 minutes away   Social Determinants of Health   Financial Resource Strain: Low Risk  (01/02/2022)   Overall Financial Resource Strain (CARDIA)    Difficulty of Paying Living Expenses: Not very hard  Food Insecurity: No Food Insecurity (01/02/2022)   Hunger Vital Sign    Worried About Running Out of Food in the Last Year: Never true    Ran Out of Food in the Last Year: Never true  Transportation Needs: No Transportation Needs (01/02/2022)   PRAPARE - Administrator, Civil Service (Medical): No    Lack of Transportation (Non-Medical): No  Physical Activity: Insufficiently Active (01/02/2022)   Exercise Vital Sign    Days of Exercise per Week: 7 days    Minutes of Exercise per Session: 20 min  Stress: No Stress Concern Present (01/02/2022)   [employer] of Occupational Health - Occupational Stress Questionnaire  Feeling of Stress : Only a little  Social Connections: Moderately Isolated (01/02/2022)   Social Connection and Isolation Panel [NHANES]    Frequency of Communication with Friends and Family: Three times a week    Frequency of Social Gatherings with Friends and Family: Three times a week    Attends Religious Services: More than 4 times per year    Active Member of Clubs or Organizations: No    Attends Banker Meetings: Never    Marital Status: Widowed  Intimate Partner Violence: Not At Risk (01/02/2022)   Humiliation, Afraid, Rape, and Kick questionnaire    Fear of Current or Ex-Partner: No    Emotionally Abused: No    Physically  Abused: No    Sexually Abused: No    Outpatient Encounter Medications as of 07/24/2022  Medication Sig   atorvastatin (LIPITOR) 10 MG tablet TAKE 1 TABLET BY MOUTH EVERY DAY   Calcium Carb-Cholecalciferol 600-400 MG-UNIT TABS Take 1 tablet by mouth daily.    ciprofloxacin (CIPRO) 500 MG tablet Take 1 tablet (500 mg total) by mouth 2 (two) times daily for 5 days.   levothyroxine (SYNTHROID) 75 MCG tablet TAKE 1 TABLET BY MOUTH EVERY DAY   lisinopril-hydrochlorothiazide (ZESTORETIC) 20-25 MG tablet TAKE 1 TABLET BY MOUTH EVERY DAY   Multiple Vitamin (MULTIVITAMIN) tablet Take 1 tablet by mouth daily.   phenazopyridine (PYRIDIUM) 200 MG tablet Take 1 tablet (200 mg total) by mouth 3 (three) times daily as needed for pain.   No facility-administered encounter medications on file as of 07/24/2022.    Allergies  Allergen Reactions   Penicillins Rash    Did it involve swelling of the face/tongue/throat, SOB, or low BP? Unknown Did it involve sudden or severe rash/hives, skin peeling, or any reaction on the inside of your mouth or nose? Ruta Hinds Did you need to seek medical attention at a hospital or doctor's office? No When did it last happen?  childhood If all above answers are "NO", may proceed with cephalosporin use.    Sulfa Antibiotics Rash    Review of Systems  Constitutional:  Negative for activity change, appetite change, chills, diaphoresis, fatigue, fever and unexpected weight change.  Respiratory:  Negative for cough and shortness of breath.   Cardiovascular:  Negative for chest pain, palpitations and leg swelling.  Gastrointestinal:  Negative for abdominal pain, nausea and vomiting.  Genitourinary:  Positive for dysuria, frequency and urgency. Negative for decreased urine volume, difficulty urinating, enuresis, flank pain, genital sores, hematuria, hesitancy, pelvic pain, vaginal bleeding, vaginal discharge and vaginal pain.  Musculoskeletal:  Negative for back pain.   Neurological:  Negative for dizziness, tremors, seizures, syncope, facial asymmetry, speech difficulty, weakness, light-headedness, numbness and headaches.  Psychiatric/Behavioral:  Negative for confusion and decreased concentration.   All other systems reviewed and are negative.       Objective:  BP 137/68   Pulse 94   Temp 98.4 F (36.9 C)   Ht 5\' 6"  (1.676 m)   Wt 169 lb (76.7 kg)   SpO2 95%   BMI 27.28 kg/m    Wt Readings from Last 3 Encounters:  07/24/22 169 lb (76.7 kg)  06/12/22 170 lb 6.4 oz (77.3 kg)  02/21/22 172 lb (78 kg)    Physical Exam Vitals and nursing note reviewed.  Constitutional:      General: She is not in acute distress.    Appearance: Normal appearance. She is normal weight. She is not ill-appearing, toxic-appearing or diaphoretic.  HENT:  Head: Normocephalic and atraumatic.     Mouth/Throat:     Mouth: Mucous membranes are moist.  Eyes:     Pupils: Pupils are equal, round, and reactive to light.  Cardiovascular:     Rate and Rhythm: Normal rate and regular rhythm.     Heart sounds: Normal heart sounds.  Pulmonary:     Effort: Pulmonary effort is normal.     Breath sounds: Normal breath sounds.  Abdominal:     General: Bowel sounds are normal.     Palpations: Abdomen is soft.     Tenderness: There is no abdominal tenderness. There is no right CVA tenderness or left CVA tenderness.  Skin:    General: Skin is warm and dry.     Capillary Refill: Capillary refill takes less than 2 seconds.  Neurological:     General: No focal deficit present.     Mental Status: She is alert and oriented to person, place, and time.  Psychiatric:        Mood and Affect: Mood normal.        Behavior: Behavior normal.        Thought Content: Thought content normal.        Judgment: Judgment normal.     Results for orders placed or performed in visit on 06/12/22  Urine Culture   Specimen: Urine   UR  Result Value Ref Range   Urine Culture, Routine  Final report (A)    Organism ID, Bacteria Comment (A)    Antimicrobial Susceptibility Comment   Microscopic Examination   Urine  Result Value Ref Range   WBC, UA >30 (A) 0 - 5 /hpf   RBC, Urine 0-2 0 - 2 /hpf   Epithelial Cells (non renal) 0-10 0 - 10 /hpf   Renal Epithel, UA None seen None seen /hpf   Bacteria, UA Many (A) None seen/Few  Urinalysis, Routine w reflex microscopic  Result Value Ref Range   Specific Gravity, UA 1.015 1.005 - 1.030   pH, UA 7.0 5.0 - 7.5   Color, UA Yellow Yellow   Appearance Ur Cloudy (A) Clear   Leukocytes,UA 3+ (A) Negative   Protein,UA 1+ (A) Negative/Trace   Glucose, UA Negative Negative   Ketones, UA Negative Negative   RBC, UA 1+ (A) Negative   Bilirubin, UA Negative Negative   Urobilinogen, Ur 0.2 0.2 - 1.0 mg/dL   Nitrite, UA Negative Negative   Microscopic Examination See below:        Pertinent labs & imaging results that were available during my care of the patient were reviewed by me and considered in my medical decision making.  Assessment & Plan:  Rowen was seen today for dysuria.  Diagnoses and all orders for this visit:  Dysuria Recurrent UTI Urinalysis positive for nitrites and leukocytes in office. Culture pending. Pt aware to increase water intake and avoid bladder irritants such as caffeine. Will place on cipro as pt was just treated for UTI last month with Keflex. Allergies to PCN and Sulfa. Repeat urinalysis and culture in 2 weeks. No red flags present concerning for acute pyelonephritis. Report new, worsening, or persistent symptoms.  -     Urinalysis, Routine w reflex microscopic -     Urine Culture -     ciprofloxacin (CIPRO) 500 MG tablet; Take 1 tablet (500 mg total) by mouth 2 (two) times daily for 5 days.     Continue all other maintenance medications.  Follow up plan: Return in  about 2 weeks (around 08/07/2022), or if symptoms worsen or fail to improve, for urine recheck .   Continue healthy lifestyle  choices, including diet (rich in fruits, vegetables, and lean proteins, and low in salt and simple carbohydrates) and exercise (at least 30 minutes of moderate physical activity daily).  Educational handout given for UTI  The above assessment and management plan was discussed with the patient. The patient verbalized understanding of and has agreed to the management plan. Patient is aware to call the clinic if they develop any new symptoms or if symptoms persist or worsen. Patient is aware when to return to the clinic for a follow-up visit. Patient educated on when it is appropriate to go to the emergency department.   Kari Baars, FNP-C [facility] [phone]

## 2022-07-26 LAB — URINE CULTURE

## 2022-07-28 ENCOUNTER — Other Ambulatory Visit: Payer: Self-pay | Admitting: Family Medicine

## 2022-07-28 DIAGNOSIS — E7841 Elevated Lipoprotein(a): Secondary | ICD-10-CM

## 2022-08-07 ENCOUNTER — Other Ambulatory Visit: Payer: Self-pay | Admitting: *Deleted

## 2022-08-07 ENCOUNTER — Other Ambulatory Visit: Payer: Medicare Other

## 2022-08-07 DIAGNOSIS — N390 Urinary tract infection, site not specified: Secondary | ICD-10-CM

## 2022-08-07 LAB — URINALYSIS, ROUTINE W REFLEX MICROSCOPIC
Bilirubin, UA: NEGATIVE
Glucose, UA: NEGATIVE
Ketones, UA: NEGATIVE
Nitrite, UA: NEGATIVE
Specific Gravity, UA: 1.015 (ref 1.005–1.030)
Urobilinogen, Ur: 0.2 mg/dL (ref 0.2–1.0)
pH, UA: 5.5 (ref 5.0–7.5)

## 2022-08-09 LAB — URINE CULTURE

## 2022-09-09 ENCOUNTER — Ambulatory Visit (INDEPENDENT_AMBULATORY_CARE_PROVIDER_SITE_OTHER): Payer: Medicare Other

## 2022-09-09 DIAGNOSIS — Z23 Encounter for immunization: Secondary | ICD-10-CM

## 2022-11-26 ENCOUNTER — Other Ambulatory Visit: Payer: Self-pay | Admitting: Family Medicine

## 2022-11-26 DIAGNOSIS — E039 Hypothyroidism, unspecified: Secondary | ICD-10-CM

## 2022-12-09 DIAGNOSIS — H3589 Other specified retinal disorders: Secondary | ICD-10-CM | POA: Diagnosis not present

## 2022-12-13 ENCOUNTER — Other Ambulatory Visit: Payer: Self-pay | Admitting: Family Medicine

## 2022-12-13 DIAGNOSIS — I10 Essential (primary) hypertension: Secondary | ICD-10-CM

## 2022-12-18 ENCOUNTER — Telehealth: Payer: Self-pay | Admitting: Family Medicine

## 2022-12-18 NOTE — Telephone Encounter (Signed)
Sending to Monia Pouch, FNP, last chronic FU was 02/21/22 Last RF 12/13/22 #30 w/ NTBS appt w/ Sharyn Lull sched for 03/12/23

## 2022-12-26 ENCOUNTER — Encounter: Payer: Medicare Other | Admitting: Nurse Practitioner

## 2023-01-05 ENCOUNTER — Ambulatory Visit (INDEPENDENT_AMBULATORY_CARE_PROVIDER_SITE_OTHER): Payer: Medicare Other

## 2023-01-05 VITALS — Ht 66.0 in | Wt 169.0 lb

## 2023-01-05 DIAGNOSIS — Z0000 Encounter for general adult medical examination without abnormal findings: Secondary | ICD-10-CM | POA: Diagnosis not present

## 2023-01-05 NOTE — Progress Notes (Signed)
Subjective:   Hannah [name] is a 82 y.o. female who presents for Medicare Annual (Subsequent) preventive examination.  I connected with  Hannah [name] on 01/05/23 by a audio enabled telemedicine application and verified that I am speaking with the correct person using two identifiers.  Patient Location: Home  Provider Location: Home Office  I discussed the limitations of evaluation and management by telemedicine. The patient expressed understanding and agreed to proceed.  Review of Systems     Cardiac Risk Factors include: advanced age (>5men, >69 women);dyslipidemia;hypertension     Objective:    Today's Vitals   01/05/23 1427  Weight: 169 lb (76.7 kg)  Height: 5\' 6"  (1.676 m)   Body mass index is 27.28 kg/m.     01/05/2023    2:29 PM 01/02/2022    2:06 PM 12/31/2020    8:24 AM 10/24/2019   10:15 AM 06/22/2019   10:16 AM 06/20/2019   10:58 PM 06/20/2019    2:25 PM  Advanced Directives  Does Patient Have a Medical Advance Directive? No No No No No No No  Would patient like information on creating a medical advance directive? No - Patient declined No - Patient declined No - Patient declined No - Patient declined  No - Guardian declined     Current Medications (verified) Outpatient Encounter Medications as of 01/05/2023  Medication Sig   atorvastatin (LIPITOR) 10 MG tablet TAKE 1 TABLET BY MOUTH EVERY DAY   Calcium Carb-Cholecalciferol 600-400 MG-UNIT TABS Take 1 tablet by mouth daily.    levothyroxine (SYNTHROID) 75 MCG tablet TAKE 1 TABLET BY MOUTH EVERY DAY   lisinopril-hydrochlorothiazide (ZESTORETIC) 20-25 MG tablet Take 1 tablet by mouth daily. (NEEDS TO BE SEEN BEFORE NEXT REFILL)   Multiple Vitamin (MULTIVITAMIN) tablet Take 1 tablet by mouth daily.   phenazopyridine (PYRIDIUM) 200 MG tablet Take 1 tablet (200 mg total) by mouth 3 (three) times daily as needed for pain.   No facility-administered encounter medications on file as of 01/05/2023.    Allergies  (verified) Penicillins and Sulfa antibiotics   History: Past Medical History:  Diagnosis Date   Cataract    Cataracts, bilateral    Hyperlipidemia    Hypertension    Incarcerated umbilical hernia 04/09/2584   Thyroid disease    Past Surgical History:  Procedure Laterality Date   ABDOMINAL HYSTERECTOMY     APPENDECTOMY     BLADDER SURGERY     EYE SURGERY     Cataracts   HERNIA REPAIR     UMBILICAL HERNIA REPAIR N/A 06/22/2019   Procedure: HERNIA REPAIR UMBILICAL ADULT;  Surgeon: Aviva Signs, MD;  Location: [facility];  Service: General;  Laterality: N/A;   Family History  Problem Relation Age of Onset   Stroke Mother    Heart disease Mother    Heart disease Father    Stroke Father    Cancer Father    Cancer Sister    Aneurysm Brother    Social History   Socioeconomic History   Marital status: Widowed    Spouse name: Not on file   Number of children: 1   Years of education: 27   Highest education level: Not on file  Occupational History   Occupation: Retired  Tobacco Use   Smoking status: Never   Smokeless tobacco: Never  Vaping Use   Vaping Use: Never used  Substance and Sexual Activity   Alcohol use: Never   Drug use: Never   Sexual activity: Not Currently  Other  Topics Concern   Not on file  Social History Narrative   Lives alone - duplex - only 2 stairs to come in   Neighbor is handicap, so she mows his yard and helps him when needed   Very independent - no cane or walker    Daughter lives 5 minutes away   Social Determinants of Health   Financial Resource Strain: Low Risk  (01/05/2023)   Overall Financial Resource Strain (CARDIA)    Difficulty of Paying Living Expenses: Not hard at all  Food Insecurity: No Food Insecurity (01/05/2023)   Hunger Vital Sign    Worried About Running Out of Food in the Last Year: Never true    [place] in the Last Year: Never true  Transportation Needs: No Transportation Needs (01/05/2023)   PRAPARE - Armed forces logistics/support/administrative officer (Medical): No    Lack of Transportation (Non-Medical): No  Physical Activity: Sufficiently Active (01/05/2023)   Exercise Vital Sign    Days of Exercise per Week: 7 days    Minutes of Exercise per Session: 30 min  Stress: No Stress Concern Present (01/05/2023)   [place]    Feeling of Stress : Not at all  Social Connections: Moderately Isolated (01/05/2023)   Social Connection and Isolation Panel [NHANES]    Frequency of Communication with Friends and Family: More than three times a week    Frequency of Social Gatherings with Friends and Family: Three times a week    Attends Religious Services: More than 4 times per year    Active Member of Clubs or Organizations: No    Attends Archivist Meetings: Never    Marital Status: Widowed    Tobacco Counseling Counseling given: Not Answered   Clinical Intake:  Pre-visit preparation completed: Yes  Pain : No/denies pain  Diabetes: No  How often do you need to have someone help you when you read instructions, pamphlets, or other written materials from your doctor or pharmacy?: 1 - Never  Diabetic?No   Interpreter Needed?: No  Information entered by :: Hannah George LPN   Activities of Daily Living    01/05/2023    2:30 PM  In your present state of health, do you have any difficulty performing the following activities:  Hearing? 0  Vision? 0  Difficulty concentrating or making decisions? 0  Walking or climbing stairs? 0  Dressing or bathing? 0  Doing errands, shopping? 0  Preparing Food and eating ? N  Using the Toilet? N  In the past six months, have you accidently leaked urine? N  Do you have problems with loss of bowel control? N  Managing your Medications? N  Managing your Finances? N  Housekeeping or managing your Housekeeping? N    Patient Care Team: Hannah Gouty, FNP as PCP - General (Family Medicine) Hannah [name], OD (Optometry)  Indicate any recent Medical Services you may have received from other than [facility] providers in the past year (date may be approximate).     Assessment:   This is a routine wellness examination for [name].  Hearing/Vision screen Hearing Screening - Comments:: Denies hearing difficulties  Vision Screening - Comments:: Wears rx glasses - up to date with routine eye exams with Dr. Leticia [name]    Dietary issues and exercise activities discussed: Current Exercise Habits: Home exercise routine, Type of exercise: walking, Time (Minutes): 30, Frequency (Times/Week): 5, Weekly Exercise (Minutes/Week): 150, Intensity:  Mild   Goals Addressed   None    Depression Screen    01/05/2023    2:29 PM 07/24/2022    9:26 AM 02/21/2022   10:14 AM 01/02/2022    2:04 PM 12/24/2021    9:44 AM 05/21/2021   10:08 AM 12/31/2020    8:21 AM  PHQ 2/9 Scores  PHQ - 2 Score 0 0 0 0 0 3 0  PHQ- 9 Score  0 0 0 0 3     Fall Risk    01/05/2023    2:28 PM 07/24/2022    9:27 AM 02/21/2022   10:14 AM 01/02/2022    2:06 PM 12/24/2021    9:44 AM  Fall Risk   Falls in the past year? 0 0 0 0 0  Number falls in past yr: 0   0   Injury with Fall? 0   0   Risk for fall due to :    Orthopedic patient;Impaired vision   Follow up Falls prevention discussed;Education provided;Falls evaluation completed   Falls prevention discussed     FALL RISK PREVENTION PERTAINING TO THE HOME:  Any stairs in or around the home? No  If so, are there any without handrails? No  Home free of loose throw rugs in walkways, pet beds, electrical cords, etc? Yes  Adequate lighting in your home to reduce risk of falls? Yes   ASSISTIVE DEVICES UTILIZED TO PREVENT FALLS:  Life alert? No  Use of a cane, walker or w/c? No  Grab bars in the bathroom? Yes  Shower chair or bench in shower? No  Elevated toilet seat or a handicapped toilet? Yes   TIMED UP AND GO:  Was the test performed? No . Telephonic visit    Cognitive Function:        01/05/2023    2:30 PM 01/02/2022    2:08 PM 12/31/2020    8:27 AM 10/24/2019   10:17 AM  6CIT Screen  What Year? 0 points 0 points 0 points 0 points  What month? 0 points 0 points 0 points 0 points  What time? 0 points 0 points 0 points 0 points  Count back from 20 0 points 0 points 0 points 2 points  Months in reverse 0 points 0 points 0 points 0 points  Repeat phrase 0 points 2 points 2 points 4 points  Total Score 0 points 2 points 2 points 6 points    Immunizations Immunization History  Administered Date(s) Administered   Fluad Quad(high Dose 65+) 09/10/2016, 08/10/2017, 09/09/2018, 09/08/2019, 09/07/2020, 09/10/2021, 09/09/2022   Influenza Split 09/12/2020   Influenza, High Dose Seasonal PF 09/10/2016, 08/10/2017, 09/09/2018   Influenza, Seasonal, Injecte, Preservative Fre 09/14/2014, 09/03/2015   Influenza-Unspecified 09/14/2014, 09/03/2015   Moderna Sars-Covid-2 Vaccination 09/13/2019, 02/22/2020, 03/19/2020, 10/22/2020   Pneumococcal Conjugate-13 08/03/2015   Pneumococcal Polysaccharide-23 12/09/2016   Tdap 08/03/2015    TDAP status: Up to date  Flu Vaccine status: Up to date  Pneumococcal vaccine status: Up to date  Covid-19 vaccine status: Information provided on how to obtain vaccines.   Qualifies for Shingles Vaccine? No   Zostavax completed No   Shingrix Completed?: Yes  Screening Tests Health Maintenance  Topic Date Due   Zoster Vaccines- Shingrix (1 of 2) Never done   COVID-19 Vaccine (5 - 2023-24 season) 08/01/2022   Medicare Annual Wellness (AWV)  01/06/2024   DEXA SCAN  12/24/2024   DTaP/Tdap/Td (2 - Td or Tdap) 08/02/2025   Pneumonia Vaccine 81+ Years old  Completed   INFLUENZA VACCINE  Completed   HPV VACCINES  Aged Out    Health Maintenance  Health Maintenance Due  Topic Date Due   Zoster Vaccines- Shingrix (1 of 2) Never done   COVID-19 Vaccine (5 - 2023-24 season) 08/01/2022    Colorectal cancer  screening: No longer required.   Mammogram status: No longer required due to age.  Bone Density status: Completed 12/24/21. Results reflect: Bone density results: NORMAL. Repeat every 5 years.  Lung Cancer Screening: (Low Dose CT Chest recommended if Age 46-80 years, 30 pack-year currently smoking OR have quit w/in 15years.) does not qualify.   Lung Cancer Screening Referral: n/a   Additional Screening:  Hepatitis C Screening: does not qualify  Vision Screening: Recommended annual ophthalmology exams for early detection of glaucoma and other disorders of the eye. Is the patient up to date with their annual eye exam?  Yes  Who is the provider or what is the name of the office in which the patient attends annual eye exams? Dr. Desiree Lucy  If pt is not established with a provider, would they like to be referred to a provider to establish care? No .   Dental Screening: Recommended annual dental exams for proper oral hygiene  Community Resource Referral / Chronic Care Management: CRR required this visit?  No   CCM required this visit?  No      Plan:     I have personally reviewed and noted the following in the patient's chart:   Medical and social history Use of alcohol, tobacco or illicit drugs  Current medications and supplements including opioid prescriptions. Patient is not currently taking opioid prescriptions. Functional ability and status Nutritional status Physical activity Advanced directives List of other physicians Hospitalizations, surgeries, and ER visits in previous 12 months Vitals Screenings to include cognitive, depression, and falls Referrals and appointments  In addition, I have reviewed and discussed with patient certain preventive protocols, quality metrics, and best practice recommendations. A written personalized care plan for preventive services as well as general preventive health recommendations were provided to patient.     Durwin Nora, [place]   01/31/4400   Due to this being a virtual visit, the after visit summary with patients personalized plan was offered to patient via mail or my-chart.  per request, patient was mailed a copy of AVS.  Nurse Notes: No concerns

## 2023-01-05 NOTE — Patient Instructions (Signed)
Hannah [name] , Thank you for taking time to come for your Medicare Wellness Visit. I appreciate your ongoing commitment to your health goals. Please review the following plan we discussed and let me know if I can assist you in the future.   These are the goals we discussed:  Goals      AWV     10/24/2019 AWV Goal: Fall Prevention  Over the next year, patient will decrease their risk for falls by: Using assistive devices, such as a cane or walker, as needed Identifying fall risks within their home and correcting them by: Removing throw rugs Adding handrails to stairs or ramps Removing clutter and keeping a clear pathway throughout the home Increasing light, especially at night Adding shower handles/bars Raising toilet seat Identifying potential personal risk factors for falls: Medication side effects Incontinence/urgency Vestibular dysfunction Hearing loss Musculoskeletal disorders Neurological disorders Orthostatic hypotension       AWV     01/02/2022 AWV Goal: Fall Prevention  Over the next year, patient will decrease their risk for falls by: Using assistive devices, such as a cane or walker, as needed Identifying fall risks within their home and correcting them by: Removing throw rugs Adding handrails to stairs or ramps Removing clutter and keeping a clear pathway throughout the home Increasing light, especially at night Adding shower handles/bars Raising toilet seat Identifying potential personal risk factors for falls: Medication side effects Incontinence/urgency Vestibular dysfunction Hearing loss Musculoskeletal disorders Neurological disorders Orthostatic hypotension  12/31/2020 AWV Goal: Exercise for General Health  Patient will verbalize understanding of the benefits of increased physical activity: Exercising regularly is important. It will improve your overall fitness, flexibility, and endurance. Regular exercise also will improve your overall health. It  can help you control your weight, reduce stress, and improve your bone density. Over the next year, patient will increase physical activity as tolerated with a goal of at least 150 minutes of moderate physical activity per week.  You can tell that you are exercising at a moderate intensity if your heart starts beating faster and you start breathing faster but can still hold a conversation. Moderate-intensity exercise ideas include: Walking 1 mile (1.6 km) in about 15 minutes Biking Hiking Golfing Dancing Water aerobics Patient will verbalize understanding of everyday activities that increase physical activity by providing examples like the following: Yard work, such as: Sales promotion account executive Gardening Washing windows or floors Patient will be able to explain general safety guidelines for exercising:  Before you start a new exercise program, talk with your health care provider. Do not exercise so much that you hurt yourself, feel dizzy, or get very short of breath. Wear comfortable clothes and wear shoes with good support. Drink plenty of water while you exercise to prevent dehydration or heat stroke. Work out until your breathing and your heartbeat get faster.         This is a list of the screening recommended for you and due dates:  Health Maintenance  Topic Date Due   Zoster (Shingles) Vaccine (1 of 2) Never done   COVID-19 Vaccine (5 - 2023-24 season) 08/01/2022   Medicare Annual Wellness Visit  01/06/2024   DEXA scan (bone density measurement)  12/24/2024   DTaP/Tdap/Td vaccine (2 - Td or Tdap) 08/02/2025   Pneumonia Vaccine  Completed   Flu Shot  Completed   HPV Vaccine  Aged Out    Advanced directives: Forms are available  if you choose in the future to pursue completion.  This is recommended in order to make sure that your health wishes are honored in the event that you are unable to  verbalize them to the provider.    Conditions/risks identified: Aim for 30 minutes of exercise or brisk walking, 6-8 glasses of water, and 5 servings of fruits and vegetables each day.  Next appointment: Follow up in one year for your annual wellness visit    Preventive Care 65 Years and Older, Female Preventive care refers to lifestyle choices and visits with your health care provider that can promote health and wellness. What does preventive care include? A yearly physical exam. This is also called an annual well check. Dental exams once or twice a year. Routine eye exams. Ask your health care provider how often you should have your eyes checked. Personal lifestyle choices, including: Daily care of your teeth and gums. Regular physical activity. Eating a healthy diet. Avoiding tobacco and drug use. Limiting alcohol use. Practicing safe sex. Taking low-dose aspirin every day. Taking vitamin and mineral supplements as recommended by your health care provider. What happens during an annual well check? The services and screenings done by your health care provider during your annual well check will depend on your age, overall health, lifestyle risk factors, and family history of disease. Counseling  Your health care provider may ask you questions about your: Alcohol use. Tobacco use. Drug use. Emotional well-being. Home and relationship well-being. Sexual activity. Eating habits. History of falls. Memory and ability to understand (cognition). Work and work Astronomer. Reproductive health. Screening  You may have the following tests or measurements: Height, weight, and BMI. Blood pressure. Lipid and cholesterol levels. These may be checked every 5 years, or more frequently if you are over 32 years old. Skin check. Lung cancer screening. You may have this screening every year starting at age 20 if you have a 30-pack-year history of smoking and currently smoke or have quit  within the past 15 years. Fecal occult blood test (FOBT) of the stool. You may have this test every year starting at age 13. Flexible sigmoidoscopy or colonoscopy. You may have a sigmoidoscopy every 5 years or a colonoscopy every 10 years starting at age 5. Hepatitis C blood test. Hepatitis B blood test. Sexually transmitted disease (STD) testing. Diabetes screening. This is done by checking your blood sugar (glucose) after you have not eaten for a while (fasting). You may have this done every 1-3 years. Bone density scan. This is done to screen for osteoporosis. You may have this done starting at age 49. Mammogram. This may be done every 1-2 years. Talk to your health care provider about how often you should have regular mammograms. Talk with your health care provider about your test results, treatment options, and if necessary, the need for more tests. Vaccines  Your health care provider may recommend certain vaccines, such as: Influenza vaccine. This is recommended every year. Tetanus, diphtheria, and acellular pertussis (Tdap, Td) vaccine. You may need a Td booster every 10 years. Zoster vaccine. You may need this after age 15. Pneumococcal 13-valent conjugate (PCV13) vaccine. One dose is recommended after age 26. Pneumococcal polysaccharide (PPSV23) vaccine. One dose is recommended after age 96. Talk to your health care provider about which screenings and vaccines you need and how often you need them. This information is not intended to replace advice given to you by your health care provider. Make sure you discuss any questions you have  with your health care provider. Document Released: 12/14/2015 Document Revised: 08/06/2016 Document Reviewed: 09/18/2015 Elsevier Interactive Patient Education  2017 [employer] Prevention in the Home Falls can cause injuries. They can happen to people of all ages. There are many things you can do to make your home safe and to help prevent  falls. What can I do on the outside of my home? Regularly fix the edges of walkways and driveways and fix any cracks. Remove anything that might make you trip as you walk through a door, such as a raised step or threshold. Trim any bushes or trees on the path to your home. Use bright outdoor lighting. Clear any walking paths of anything that might make someone trip, such as rocks or tools. Regularly check to see if handrails are loose or broken. Make sure that both sides of any steps have handrails. Any raised decks and porches should have guardrails on the edges. Have any leaves, snow, or ice cleared regularly. Use sand or salt on walking paths during winter. Clean up any spills in your garage right away. This includes oil or grease spills. What can I do in the bathroom? Use night lights. Install grab bars by the toilet and in the tub and shower. Do not use towel bars as grab bars. Use non-skid mats or decals in the tub or shower. If you need to sit down in the shower, use a plastic, non-slip stool. Keep the floor dry. Clean up any water that spills on the floor as soon as it happens. Remove soap buildup in the tub or shower regularly. Attach bath mats securely with double-sided non-slip rug tape. Do not have throw rugs and other things on the floor that can make you trip. What can I do in the bedroom? Use night lights. Make sure that you have a light by your bed that is easy to reach. Do not use any sheets or blankets that are too big for your bed. They should not hang down onto the floor. Have a firm chair that has side arms. You can use this for support while you get dressed. Do not have throw rugs and other things on the floor that can make you trip. What can I do in the kitchen? Clean up any spills right away. Avoid walking on wet floors. Keep items that you use a lot in easy-to-reach places. If you need to reach something above you, use a strong step stool that has a grab  bar. Keep electrical cords out of the way. Do not use floor polish or wax that makes floors slippery. If you must use wax, use non-skid floor wax. Do not have throw rugs and other things on the floor that can make you trip. What can I do with my stairs? Do not leave any items on the stairs. Make sure that there are handrails on both sides of the stairs and use them. Fix handrails that are broken or loose. Make sure that handrails are as long as the stairways. Check any carpeting to make sure that it is firmly attached to the stairs. Fix any carpet that is loose or worn. Avoid having throw rugs at the top or bottom of the stairs. If you do have throw rugs, attach them to the floor with carpet tape. Make sure that you have a light switch at the top of the stairs and the bottom of the stairs. If you do not have them, ask someone to add them for you. What  else can I do to help prevent falls? Wear shoes that: Do not have high heels. Have rubber bottoms. Are comfortable and fit you well. Are closed at the toe. Do not wear sandals. If you use a stepladder: Make sure that it is fully opened. Do not climb a closed stepladder. Make sure that both sides of the stepladder are locked into place. Ask someone to hold it for you, if possible. Clearly mark and make sure that you can see: Any grab bars or handrails. First and last steps. Where the edge of each step is. Use tools that help you move around (mobility aids) if they are needed. These include: Canes. Walkers. Scooters. Crutches. Turn on the lights when you go into a dark area. Replace any light bulbs as soon as they burn out. Set up your furniture so you have a clear path. Avoid moving your furniture around. If any of your floors are uneven, fix them. If there are any pets around you, be aware of where they are. Review your medicines with your doctor. Some medicines can make you feel dizzy. This can increase your chance of falling. Ask  your doctor what other things that you can do to help prevent falls. This information is not intended to replace advice given to you by your health care provider. Make sure you discuss any questions you have with your health care provider. Document Released: 09/13/2009 Document Revised: 04/24/2016 Document Reviewed: 12/22/2014 Elsevier Interactive Patient Education  2017 [employer].

## 2023-01-06 ENCOUNTER — Other Ambulatory Visit: Payer: Self-pay | Admitting: Family Medicine

## 2023-01-06 DIAGNOSIS — I10 Essential (primary) hypertension: Secondary | ICD-10-CM

## 2023-01-21 ENCOUNTER — Other Ambulatory Visit: Payer: Self-pay | Admitting: Family Medicine

## 2023-01-21 DIAGNOSIS — E7841 Elevated Lipoprotein(a): Secondary | ICD-10-CM

## 2023-02-20 ENCOUNTER — Other Ambulatory Visit: Payer: Self-pay | Admitting: Family Medicine

## 2023-02-20 DIAGNOSIS — E039 Hypothyroidism, unspecified: Secondary | ICD-10-CM

## 2023-03-12 ENCOUNTER — Ambulatory Visit (INDEPENDENT_AMBULATORY_CARE_PROVIDER_SITE_OTHER): Payer: Medicare Other | Admitting: Family Medicine

## 2023-03-12 ENCOUNTER — Encounter: Payer: Self-pay | Admitting: Family Medicine

## 2023-03-12 VITALS — BP 156/77 | HR 88 | Temp 97.8°F | Ht 66.0 in | Wt 173.2 lb

## 2023-03-12 DIAGNOSIS — E039 Hypothyroidism, unspecified: Secondary | ICD-10-CM | POA: Diagnosis not present

## 2023-03-12 DIAGNOSIS — R7309 Other abnormal glucose: Secondary | ICD-10-CM | POA: Diagnosis not present

## 2023-03-12 DIAGNOSIS — E782 Mixed hyperlipidemia: Secondary | ICD-10-CM

## 2023-03-12 DIAGNOSIS — E7841 Elevated Lipoprotein(a): Secondary | ICD-10-CM

## 2023-03-12 DIAGNOSIS — I10 Essential (primary) hypertension: Secondary | ICD-10-CM

## 2023-03-12 DIAGNOSIS — D696 Thrombocytopenia, unspecified: Secondary | ICD-10-CM | POA: Diagnosis not present

## 2023-03-12 LAB — CMP14+EGFR

## 2023-03-12 LAB — CBC WITH DIFFERENTIAL/PLATELET
EOS (ABSOLUTE): 0.1 10*3/uL (ref 0.0–0.4)
Immature Grans (Abs): 0 10*3/uL (ref 0.0–0.1)
Monocytes Absolute: 0.4 10*3/uL (ref 0.1–0.9)
RDW: 13.1 % (ref 11.7–15.4)

## 2023-03-12 LAB — THYROID PANEL WITH TSH

## 2023-03-12 LAB — BAYER DCA HB A1C WAIVED: HB A1C (BAYER DCA - WAIVED): 5.4 % (ref 4.8–5.6)

## 2023-03-12 LAB — LIPID PANEL

## 2023-03-12 MED ORDER — ATORVASTATIN CALCIUM 10 MG PO TABS: 10.0000 mg | ORAL_TABLET | Freq: Every day | ORAL | 1 refills | Status: DC

## 2023-03-12 MED ORDER — LEVOTHYROXINE SODIUM 75 MCG PO TABS
75.0000 ug | ORAL_TABLET | Freq: Every day | ORAL | 1 refills | Status: DC
Start: 2023-03-12 — End: 2023-09-08

## 2023-03-12 MED ORDER — LISINOPRIL-HYDROCHLOROTHIAZIDE 20-25 MG PO TABS
1.0000 | ORAL_TABLET | Freq: Every day | ORAL | 1 refills | Status: DC
Start: 2023-03-12 — End: 2023-09-07

## 2023-03-12 NOTE — Progress Notes (Signed)
Subjective:  Patient ID: Mackenzi Osio, female    DOB: [birth year], 82 y.o.   MRN: [id number]  Patient Care Team: Sonny Masters, FNP as PCP - General (Family Medicine) Desiree Lucy, OD (Optometry)   Chief Complaint:  Establish Care (Previous [facility] patient ) and Medical Management of Chronic Issues   HPI: Brynlea Kreisher is a 82 y.o. female presenting on 03/12/2023 for Establish Care (Previous [facility] patient ) and Medical Management of Chronic Issues   Pt presents today to establish care with new PCP and for management of chronic medical conditions. She is very active for her age. States she still works in her yard and is very active. Does not require assistance with ADLs. She does have visual changes but is still able to drive during the day. She is compliant with all of her medications without associated side effects. Denies hyper- or hypothyroid symptoms. No changes in bowel or bladder habits. No hair, skin, or nail, changes. Takes her levothyroxine daily on an empty stomach. She is on statin therapy for elevated cholesterol. Does try to follow a healthy diet. Does cook at home for the most part. She is on zestoretic for her hypertension with good control. No headaches, chest pain, leg swelling, palpitations, confusion, or weakness. Previous labs revealed a platelet count that has trended down over the last several years. Denies abnormal bleeding or bruising. No rectal bleeding or hematuria. Her glucose was also elevated. Denies hypo- or hyperglycemic symptoms. She does have a slight tremor in her left hand, states this is not bothersome and does not interfere with daily activities.    Relevant past medical, surgical, family, and social history reviewed and updated as indicated.  Allergies and medications reviewed and updated. Data reviewed: Chart in Epic.   Past Medical History:  Diagnosis Date  . Cataract   . Cataracts, bilateral   . Hyperlipidemia   . Hypertension   .  Incarcerated umbilical hernia 06/21/2019  . Thyroid disease     Past Surgical History:  Procedure Laterality Date  . ABDOMINAL HYSTERECTOMY    . APPENDECTOMY    . BLADDER SURGERY    . EYE SURGERY     Cataracts  . HERNIA REPAIR    . UMBILICAL HERNIA REPAIR N/A 06/22/2019   Procedure: HERNIA REPAIR UMBILICAL ADULT;  Surgeon: Franky Macho, MD;  Location: [facility];  Service: General;  Laterality: N/A;    Social History   Socioeconomic History  . Marital status: Widowed    Spouse name: Not on file  . Number of children: 1  . Years of education: 58  . Highest education level: Not on file  Occupational History  . Occupation: Retired  Tobacco Use  . Smoking status: Never  . Smokeless tobacco: Never  Vaping Use  . Vaping Use: Never used  Substance and Sexual Activity  . Alcohol use: Never  . Drug use: Never  . Sexual activity: Not Currently  Other Topics Concern  . Not on file  Social History Narrative   Lives alone - duplex - only 2 stairs to come in   Neighbor is handicap, so she mows his yard and helps him when needed   Very independent - no cane or walker    Daughter lives 5 minutes away   Social Determinants of Health   Financial Resource Strain: Low Risk  (01/05/2023)   Overall Financial Resource Strain (CARDIA)   . Difficulty of Paying Living Expenses: Not hard at all  Food Insecurity: No  Food Insecurity (01/05/2023)   Hunger Vital Sign   . Worried About Programme researcher, broadcasting/film/video in the Last Year: Never true   . Ran Out of Food in the Last Year: Never true  Transportation Needs: No Transportation Needs (01/05/2023)   PRAPARE - Transportation   . Lack of Transportation (Medical): No   . Lack of Transportation (Non-Medical): No  Physical Activity: Sufficiently Active (01/05/2023)   Exercise Vital Sign   . Days of Exercise per Week: 7 days   . Minutes of Exercise per Session: 30 min  Stress: No Stress Concern Present (01/05/2023)   [employer] of Occupational Health -  Occupational Stress Questionnaire   . Feeling of Stress : Not at all  Social Connections: Moderately Isolated (01/05/2023)   Social Connection and Isolation Panel [NHANES]   . Frequency of Communication with Friends and Family: More than three times a week   . Frequency of Social Gatherings with Friends and Family: Three times a week   . Attends Religious Services: More than 4 times per year   . Active Member of Clubs or Organizations: No   . Attends Banker Meetings: Never   . Marital Status: Widowed  Intimate Partner Violence: Not At Risk (01/05/2023)   Humiliation, Afraid, Rape, and Kick questionnaire   . Fear of Current or Ex-Partner: No   . Emotionally Abused: No   . Physically Abused: No   . Sexually Abused: No    Outpatient Encounter Medications as of 03/12/2023  Medication Sig  . Calcium Carb-Cholecalciferol 600-400 MG-UNIT TABS Take 1 tablet by mouth daily.   . Cranberry 500 MG CAPS Take by mouth.  . Multiple Vitamin (MULTIVITAMIN) tablet Take 1 tablet by mouth daily.  . phenazopyridine (PYRIDIUM) 200 MG tablet Take 1 tablet (200 mg total) by mouth 3 (three) times daily as needed for pain.  . [DISCONTINUED] atorvastatin (LIPITOR) 10 MG tablet TAKE 1 TABLET BY MOUTH EVERY DAY  . [DISCONTINUED] levothyroxine (SYNTHROID) 75 MCG tablet Take 1 tablet (75 mcg total) by mouth daily. (NEEDS TO BE SEEN BEFORE NEXT REFILL)  . [DISCONTINUED] lisinopril-hydrochlorothiazide (ZESTORETIC) 20-25 MG tablet Take 1 tablet by mouth daily.  Marland Kitchen atorvastatin (LIPITOR) 10 MG tablet Take 1 tablet (10 mg total) by mouth daily.  Marland Kitchen levothyroxine (SYNTHROID) 75 MCG tablet Take 1 tablet (75 mcg total) by mouth daily.  Marland Kitchen lisinopril-hydrochlorothiazide (ZESTORETIC) 20-25 MG tablet Take 1 tablet by mouth daily.   No facility-administered encounter medications on file as of 03/12/2023.    Allergies  Allergen Reactions  . Penicillins Rash    Did it involve swelling of the face/tongue/throat, SOB, or  low BP? Unknown Did it involve sudden or severe rash/hives, skin peeling, or any reaction on the inside of your mouth or nose? Ruta Hinds Did you need to seek medical attention at a hospital or doctor's office? No When did it last happen?  childhood If all above answers are "NO", may proceed with cephalosporin use.   . Sulfa Antibiotics Rash    Review of Systems  Constitutional:  Negative for activity change, appetite change, chills, diaphoresis, fatigue, fever and unexpected weight change.  HENT: Negative.    Eyes:  Positive for visual disturbance.  Respiratory:  Negative for cough, chest tightness and shortness of breath.   Cardiovascular:  Negative for chest pain, palpitations and leg swelling.  Gastrointestinal:  Negative for abdominal pain, anal bleeding, blood in stool, constipation, diarrhea, nausea and vomiting.  Endocrine: Negative.  Negative for polydipsia, polyphagia and polyuria.  Genitourinary:  Negative for decreased urine volume, difficulty urinating, dysuria, frequency, hematuria and urgency.  Musculoskeletal:  Negative for arthralgias and myalgias.  Skin: Negative.   Allergic/Immunologic: Negative.   Neurological:  Positive for tremors. Negative for dizziness, seizures, syncope, facial asymmetry, speech difficulty, weakness, light-headedness, numbness and headaches.  Hematological: Negative.  Does not bruise/bleed easily.  Psychiatric/Behavioral:  Negative for confusion, hallucinations, sleep disturbance and suicidal ideas.   All other systems reviewed and are negative.       Objective:  BP (!) 156/77   Pulse 88   Temp 97.8 F (36.6 C) (Temporal)   Ht 5\' 6"  (1.676 m)   Wt 173 lb 3.2 oz (78.6 kg)   SpO2 96%   BMI 27.96 kg/m    Wt Readings from Last 3 Encounters:  03/12/23 173 lb 3.2 oz (78.6 kg)  01/05/23 169 lb (76.7 kg)  07/24/22 169 lb (76.7 kg)    Physical Exam Vitals and nursing note reviewed.  Constitutional:      General: She is not in acute  distress.    Appearance: Normal appearance. She is well-developed and well-groomed. She is not ill-appearing, toxic-appearing or diaphoretic.  HENT:     Head: Normocephalic and atraumatic.     Jaw: There is normal jaw occlusion.     Right Ear: Hearing normal.     Left Ear: Hearing normal.     Nose: Nose normal.     Mouth/Throat:     Lips: Pink.     Mouth: Mucous membranes are moist.     Pharynx: Oropharynx is clear. Uvula midline.  Eyes:     General: Lids are normal.     Extraocular Movements: Extraocular movements intact.     Conjunctiva/sclera: Conjunctivae normal.     Pupils: Pupils are equal, round, and reactive to light.     Comments: Left eye exotropia  Neck:     Thyroid: No thyroid mass, thyromegaly or thyroid tenderness.     Vascular: No carotid bruit or JVD.     Trachea: Trachea and phonation normal.  Cardiovascular:     Rate and Rhythm: Normal rate and regular rhythm.     Chest Wall: PMI is not displaced.     Pulses: Normal pulses.     Heart sounds: Normal heart sounds. No murmur heard.    No friction rub. No gallop.  Pulmonary:     Effort: Pulmonary effort is normal. No respiratory distress.     Breath sounds: Normal breath sounds. No wheezing.  Abdominal:     General: Bowel sounds are normal. There is no distension or abdominal bruit.     Palpations: Abdomen is soft. There is no hepatomegaly or splenomegaly.     Tenderness: There is no abdominal tenderness. There is no right CVA tenderness or left CVA tenderness.     Hernia: No hernia is present.  Musculoskeletal:        General: Normal range of motion.     Cervical back: Normal range of motion and neck supple.     Right lower leg: No edema.     Left lower leg: No edema.     Comments: Kyphosis   Lymphadenopathy:     Cervical: No cervical adenopathy.  Skin:    General: Skin is warm and dry.     Capillary Refill: Capillary refill takes less than 2 seconds.     Coloration: Skin is not cyanotic, jaundiced or  pale.     Findings: No rash.  Neurological:  General: No focal deficit present.     Mental Status: She is alert and oriented to person, place, and time.     Sensory: Sensation is intact.     Motor: Motor function is intact.     Coordination: Coordination is intact.     Gait: Gait is intact.     Deep Tendon Reflexes: Reflexes are normal and symmetric.  Psychiatric:        Attention and Perception: Attention and perception normal.        Mood and Affect: Mood and affect normal.        Speech: Speech normal.        Behavior: Behavior normal. Behavior is cooperative.        Thought Content: Thought content normal.        Cognition and Memory: Cognition and memory normal.        Judgment: Judgment normal.    Results for orders placed or performed in visit on 03/12/23  Bayer DCA Hb A1c Waived  Result Value Ref Range   HB A1C (BAYER DCA - WAIVED) 5.4 4.8 - 5.6 %       Pertinent labs & imaging results that were available during my care of the patient were reviewed by me and considered in my medical decision making.  Assessment & Plan:  Clorine was seen today for establish care and medical management of chronic issues.  Diagnoses and all orders for this visit:  Acquired hypothyroidism Thyroid disease has been well controlled. Labs are pending. Adjustments to regimen will be made if warranted. Make sure to take medications on an empty stomach with a full glass of water. Make sure to avoid vitamins or supplements for at least 4 hours before and 4 hours after taking medications. Repeat labs in 3 months if adjustments are made and in 6 months if stable.   -     levothyroxine (SYNTHROID) 75 MCG tablet; Take 1 tablet (75 mcg total) by mouth daily. -     Thyroid Panel With TSH  Mixed hyperlipidemia Elevated lipoproteins Diet encouraged - increase intake of fresh fruits and vegetables, increase intake of lean proteins. Bake, broil, or grill foods. Avoid fried, greasy, and fatty foods.  Avoid fast foods. Increase intake of fiber-rich whole grains. Exercise encouraged - at least 150 minutes per week and advance as tolerated.  Goal BMI < 25. Continue medications as prescribed. Follow up in 3-6 months as discussed.  -     atorvastatin (LIPITOR) 10 MG tablet; Take 1 tablet (10 mg total) by mouth daily. -     CMP14+EGFR -     Lipid panel  Essential hypertension BP fairly controlled. Changes were not made in regimen today. Goal BP is 130/80. Pt aware to report any persistent high or low readings. DASH diet and exercise encouraged. Exercise at least 150 minutes per week and increase as tolerated. Goal BMI > 25. Stress management encouraged. Avoid nicotine and tobacco product use. Avoid excessive alcohol and NSAID's. Avoid more than 2000 mg of sodium daily. Medications as prescribed. Follow up as scheduled.  -     lisinopril-hydrochlorothiazide (ZESTORETIC) 20-25 MG tablet; Take 1 tablet by mouth daily. -     CBC with Differential/Platelet -     CMP14+EGFR -     Thyroid Panel With TSH  Low platelet count Will repeat today and refer to hematology if warranted.  -     CBC with Differential/Platelet  Elevated glucose Will check labs today and treat if warranted. Marland Kitchen -  Bayer DCA Hb A1c Waived -     CMP14+EGFR     Continue all other maintenance medications.  Follow up plan: Return in about 6 months (around 09/11/2023) for CPE.   Continue healthy lifestyle choices, including diet (rich in fruits, vegetables, and lean proteins, and low in salt and simple carbohydrates) and exercise (at least 30 minutes of moderate physical activity daily).  Educational handout given for health maintenance   The above assessment and management plan was discussed with the patient. The patient verbalized understanding of and has agreed to the management plan. Patient is aware to call the clinic if they develop any new symptoms or if symptoms persist or worsen. Patient is aware when to return to  the clinic for a follow-up visit. Patient educated on when it is appropriate to go to the emergency department.   Kari Baars, FNP-C [facility] [phone]

## 2023-03-13 LAB — LIPID PANEL
Chol/HDL Ratio: 3.2 ratio (ref 0.0–4.4)
Cholesterol, Total: 139 mg/dL (ref 100–199)
HDL: 43 mg/dL (ref >39)
LDL Chol Calc (NIH): 71 mg/dL (ref 0–99)
Triglycerides: 140 mg/dL (ref 0–149)

## 2023-03-13 LAB — CBC WITH DIFFERENTIAL/PLATELET
Basophils Absolute: 0 10*3/uL (ref 0.0–0.2)
Basos: 1 %
Eos: 1 %
Hematocrit: 41.8 % (ref 34.0–46.6)
Hemoglobin: 14.2 g/dL (ref 11.1–15.9)
Immature Granulocytes: 0 %
Lymphocytes Absolute: 1.2 10*3/uL (ref 0.7–3.1)
Lymphs: 20 %
MCH: 29.5 pg (ref 26.6–33.0)
MCHC: 34 g/dL (ref 31.5–35.7)
MCV: 87 fL (ref 79–97)
Monocytes: 7 %
Neutrophils Absolute: 4.4 10*3/uL (ref 1.4–7.0)
Neutrophils: 71 %
Platelets: 155 10*3/uL (ref 150–450)
RBC: 4.81 x10E6/uL (ref 3.77–5.28)
WBC: 6.1 10*3/uL (ref 3.4–10.8)

## 2023-03-13 LAB — CMP14+EGFR
AST: 21 IU/L (ref 0–40)
Albumin: 4.4 g/dL (ref 3.7–4.7)
Alkaline Phosphatase: 80 IU/L (ref 44–121)
BUN/Creatinine Ratio: 18 (ref 12–28)
BUN: 17 mg/dL (ref 8–27)
CO2: 23 mmol/L (ref 20–29)
Calcium: 9.6 mg/dL (ref 8.7–10.3)
Creatinine, Ser: 0.95 mg/dL (ref 0.57–1.00)
Globulin, Total: 3.1 g/dL (ref 1.5–4.5)
Glucose: 100 mg/dL — ABNORMAL HIGH (ref 70–99)
Sodium: 139 mmol/L (ref 134–144)
Total Protein: 7.5 g/dL (ref 6.0–8.5)

## 2023-03-13 LAB — THYROID PANEL WITH TSH
T3 Uptake Ratio: 30 % (ref 24–39)
T4, Total: 8.7 ug/dL (ref 4.5–12.0)
TSH: 3.33 u[IU]/mL (ref 0.450–4.500)

## 2023-09-06 ENCOUNTER — Other Ambulatory Visit: Payer: Self-pay | Admitting: Family Medicine

## 2023-09-06 DIAGNOSIS — I10 Essential (primary) hypertension: Secondary | ICD-10-CM

## 2023-09-08 ENCOUNTER — Other Ambulatory Visit: Payer: Self-pay | Admitting: Family Medicine

## 2023-09-08 DIAGNOSIS — E039 Hypothyroidism, unspecified: Secondary | ICD-10-CM

## 2023-09-11 ENCOUNTER — Encounter: Payer: Self-pay | Admitting: Family Medicine

## 2023-09-11 ENCOUNTER — Ambulatory Visit: Payer: Medicare Other | Admitting: Family Medicine

## 2023-09-11 VITALS — BP 156/53 | HR 72 | Temp 98.0°F | Ht 66.0 in | Wt 173.4 lb

## 2023-09-11 DIAGNOSIS — E039 Hypothyroidism, unspecified: Secondary | ICD-10-CM | POA: Diagnosis not present

## 2023-09-11 DIAGNOSIS — G8929 Other chronic pain: Secondary | ICD-10-CM

## 2023-09-11 DIAGNOSIS — M25561 Pain in right knee: Secondary | ICD-10-CM

## 2023-09-11 DIAGNOSIS — I10 Essential (primary) hypertension: Secondary | ICD-10-CM | POA: Diagnosis not present

## 2023-09-11 DIAGNOSIS — E559 Vitamin D deficiency, unspecified: Secondary | ICD-10-CM | POA: Diagnosis not present

## 2023-09-11 DIAGNOSIS — Z0000 Encounter for general adult medical examination without abnormal findings: Secondary | ICD-10-CM | POA: Diagnosis not present

## 2023-09-11 DIAGNOSIS — N289 Disorder of kidney and ureter, unspecified: Secondary | ICD-10-CM | POA: Diagnosis not present

## 2023-09-11 DIAGNOSIS — E7841 Elevated Lipoprotein(a): Secondary | ICD-10-CM

## 2023-09-11 DIAGNOSIS — E782 Mixed hyperlipidemia: Secondary | ICD-10-CM

## 2023-09-11 DIAGNOSIS — Z23 Encounter for immunization: Secondary | ICD-10-CM | POA: Diagnosis not present

## 2023-09-11 MED ORDER — LISINOPRIL-HYDROCHLOROTHIAZIDE 20-25 MG PO TABS
1.0000 | ORAL_TABLET | Freq: Every day | ORAL | 0 refills | Status: DC
Start: 2023-09-11 — End: 2024-02-29

## 2023-09-11 MED ORDER — ATORVASTATIN CALCIUM 10 MG PO TABS: 10.0000 mg | ORAL_TABLET | Freq: Every day | ORAL | 1 refills | Status: DC

## 2023-09-11 MED ORDER — LEVOTHYROXINE SODIUM 75 MCG PO TABS
75.0000 ug | ORAL_TABLET | Freq: Every day | ORAL | 1 refills | Status: DC
Start: 2023-09-11 — End: 2024-03-11

## 2023-09-11 NOTE — Progress Notes (Signed)
Subjective:  Patient ID: Hannah [name], female    DOB: Apr 06, 1941, 82 y.o.   MRN: [id number]  Patient Care Team: Sonny Masters, FNP as PCP - General (Family Medicine) Dr [facility], OD (Optometry)   Chief Complaint:  Annual Exam   HPI: Hannah [name] is a 82 y.o. female presenting on 09/11/2023 for Annual Exam   Discussed the use of AI scribe software for clinical note transcription with the patient, who gave verbal consent to proceed.  History of Present Illness   Miss Tashawnda, an elderly patient with a history of hypertension and thyroid disease, presents with no new health issues since the last visit. The patient remains active, despite recent familial stress due to her sister's cancer diagnosis. She reports no chest pain or other cardiac symptoms during exertion. However, she struggles with blood pressure control, experiencing fluctuations despite daily adherence to her antihypertensive medication, Zestoretic.  The patient's thyroid condition appears well-managed, no hyperthyroid or hypothyroid symptoms reported.  She also reports a significant improvement in her bladder health, with no infections for over a year, attributing this to the use of cranberry supplements.  The patient experiences intermittent right knee pain, which has been ongoing for several years. The pain seems to improve with activity and worsens during rest. She manages the pain with ice packs and various topical creams, including Biofreeze.  The patient's vision has remained stable, although she has been diagnosed with cataracts. She continues to read daily and drives during the day, avoiding night driving due to her vision.  Overall, the patient appears to be in good health for her age, maintaining an active lifestyle and managing her chronic conditions effectively.        Relevant past medical, surgical, family, and social history reviewed and updated as indicated.  Allergies and  medications reviewed and updated. Data reviewed: Chart in Epic.   Past Medical History:  Diagnosis Date   Cataract    Cataracts, bilateral    Hyperlipidemia    Hypertension    Incarcerated umbilical hernia 06/21/2019   Thyroid disease     Past Surgical History:  Procedure Laterality Date   ABDOMINAL HYSTERECTOMY     APPENDECTOMY     BLADDER SURGERY     EYE SURGERY     Cataracts   HERNIA REPAIR     UMBILICAL HERNIA REPAIR N/A 06/22/2019   Procedure: HERNIA REPAIR UMBILICAL ADULT;  Surgeon: Franky Macho, MD;  Location: [facility];  Service: General;  Laterality: N/A;    Social History   Socioeconomic History   Marital status: Widowed    Spouse name: Not on file   Number of children: 1   Years of education: 59   Highest education level: Not on file  Occupational History   Occupation: Retired  Tobacco Use   Smoking status: Never   Smokeless tobacco: Never  Vaping Use   Vaping status: Never Used  Substance and Sexual Activity   Alcohol use: Never   Drug use: Never   Sexual activity: Not Currently  Other Topics Concern   Not on file  Social History Narrative   Lives alone - duplex - only 2 stairs to come in   Neighbor is handicap, so she mows his yard and helps him when needed   Very independent - no cane or walker    Daughter lives 5 minutes away   Social Determinants of Health   Financial Resource Strain: Low Risk  (01/05/2023)   Overall Financial  Resource Strain (CARDIA)    Difficulty of Paying Living Expenses: Not hard at all  Food Insecurity: No Food Insecurity (01/05/2023)   Hunger Vital Sign    Worried About Running Out of Food in the Last Year: Never true    Ran Out of Food in the Last Year: Never true  Transportation Needs: No Transportation Needs (01/05/2023)   PRAPARE - Administrator, Civil Service (Medical): No    Lack of Transportation (Non-Medical): No  Physical Activity: Sufficiently Active (01/05/2023)   Exercise Vital Sign    Days of  Exercise per Week: 7 days    Minutes of Exercise per Session: 30 min  Stress: No Stress Concern Present (01/05/2023)   [employer] of Occupational Health - Occupational Stress Questionnaire    Feeling of Stress : Not at all  Social Connections: Moderately Isolated (01/05/2023)   Social Connection and Isolation Panel [NHANES]    Frequency of Communication with Friends and Family: More than three times a week    Frequency of Social Gatherings with Friends and Family: Three times a week    Attends Religious Services: More than 4 times per year    Active Member of Clubs or Organizations: No    Attends Banker Meetings: Never    Marital Status: Widowed  Intimate Partner Violence: Not At Risk (01/05/2023)   Humiliation, Afraid, Rape, and Kick questionnaire    Fear of Current or Ex-Partner: No    Emotionally Abused: No    Physically Abused: No    Sexually Abused: No    Outpatient Encounter Medications as of 09/11/2023  Medication Sig   Calcium Carb-Cholecalciferol 600-400 MG-UNIT TABS Take 1 tablet by mouth daily.    Cranberry 500 MG CAPS Take by mouth.   Multiple Vitamin (MULTIVITAMIN) tablet Take 1 tablet by mouth daily.   [DISCONTINUED] atorvastatin (LIPITOR) 10 MG tablet Take 1 tablet (10 mg total) by mouth daily.   [DISCONTINUED] levothyroxine (SYNTHROID) 75 MCG tablet TAKE 1 TABLET BY MOUTH EVERY DAY   [DISCONTINUED] lisinopril-hydrochlorothiazide (ZESTORETIC) 20-25 MG tablet TAKE 1 TABLET BY MOUTH EVERY DAY   [DISCONTINUED] phenazopyridine (PYRIDIUM) 200 MG tablet Take 1 tablet (200 mg total) by mouth 3 (three) times daily as needed for pain.   atorvastatin (LIPITOR) 10 MG tablet Take 1 tablet (10 mg total) by mouth daily.   levothyroxine (SYNTHROID) 75 MCG tablet Take 1 tablet (75 mcg total) by mouth daily.   lisinopril-hydrochlorothiazide (ZESTORETIC) 20-25 MG tablet Take 1 tablet by mouth daily.   No facility-administered encounter medications on file as of  09/11/2023.    Allergies  Allergen Reactions   Penicillins Rash    Did it involve swelling of the face/tongue/throat, SOB, or low BP? Unknown Did it involve sudden or severe rash/hives, skin peeling, or any reaction on the inside of your mouth or nose? Ruta Hinds Did you need to seek medical attention at a hospital or doctor's office? No When did it last happen?  childhood If all above answers are "NO", may proceed with cephalosporin use.    Sulfa Antibiotics Rash    ROS per HPI, otherwise negative      Objective:  BP (!) 156/53   Pulse 72   Temp 98 F (36.7 C) (Temporal)   Ht 5\' 6"  (1.676 m)   Wt 173 lb 6.4 oz (78.7 kg)   SpO2 97%   BMI 27.99 kg/m    Wt Readings from Last 3 Encounters:  09/11/23 173 lb 6.4 oz (78.7 kg)  03/12/23 173 lb 3.2 oz (78.6 kg)  01/05/23 169 lb (76.7 kg)    Physical Exam Vitals and nursing note reviewed.  Constitutional:      Appearance: Normal appearance. She is well-developed, well-groomed and overweight.  HENT:     Head: Normocephalic and atraumatic.     Right Ear: Tympanic membrane, ear canal and external ear normal.     Left Ear: Tympanic membrane, ear canal and external ear normal.     Mouth/Throat:     Mouth: Mucous membranes are moist.     Pharynx: Oropharynx is clear.  Eyes:     Conjunctiva/sclera: Conjunctivae normal.     Pupils: Pupils are equal, round, and reactive to light.     Comments: Left eye exotropia   Cardiovascular:     Rate and Rhythm: Normal rate and regular rhythm.     Pulses: Normal pulses.     Heart sounds: Normal heart sounds.  Pulmonary:     Effort: Pulmonary effort is normal.     Breath sounds: Normal breath sounds.  Abdominal:     General: Bowel sounds are normal. There is no distension.     Palpations: Abdomen is soft.     Tenderness: There is no abdominal tenderness.  Musculoskeletal:     Cervical back: Normal range of motion and neck supple.     Right lower leg: No edema.     Left lower leg:  No edema.     Comments: Kyphosis   Skin:    General: Skin is warm and dry.     Capillary Refill: Capillary refill takes less than 2 seconds.  Neurological:     General: No focal deficit present.     Mental Status: She is alert and oriented to person, place, and time.  Psychiatric:        Mood and Affect: Mood normal.        Behavior: Behavior normal.        Thought Content: Thought content normal.        Judgment: Judgment normal.     Results for orders placed or performed in visit on 03/12/23  Bayer DCA Hb A1c Waived  Result Value Ref Range   HB A1C (BAYER DCA - WAIVED) 5.4 4.8 - 5.6 %  CBC with Differential/Platelet  Result Value Ref Range   WBC 6.1 3.4 - 10.8 x10E3/uL   RBC 4.81 3.77 - 5.28 x10E6/uL   Hemoglobin 14.2 11.1 - 15.9 g/dL   Hematocrit 25.9 56.3 - 46.6 %   MCV 87 79 - 97 fL   MCH 29.5 26.6 - 33.0 pg   MCHC 34.0 31.5 - 35.7 g/dL   RDW 87.5 64.3 - 32.9 %   Platelets 155 150 - 450 x10E3/uL   Neutrophils 71 Not Estab. %   Lymphs 20 Not Estab. %   Monocytes 7 Not Estab. %   Eos 1 Not Estab. %   Basos 1 Not Estab. %   Neutrophils Absolute 4.4 1.4 - 7.0 x10E3/uL   Lymphocytes Absolute 1.2 0.7 - 3.1 x10E3/uL   Monocytes Absolute 0.4 0.1 - 0.9 x10E3/uL   EOS (ABSOLUTE) 0.1 0.0 - 0.4 x10E3/uL   Basophils Absolute 0.0 0.0 - 0.2 x10E3/uL   Immature Granulocytes 0 Not Estab. %   Immature Grans (Abs) 0.0 0.0 - 0.1 x10E3/uL  CMP14+EGFR  Result Value Ref Range   Glucose 100 (H) 70 - 99 mg/dL   BUN 17 8 - 27 mg/dL   Creatinine, Ser 5.18 0.57 - 1.00  mg/dL   eGFR 60 >40 JW/JXB/1.47   BUN/Creatinine Ratio 18 12 - 28   Sodium 139 134 - 144 mmol/L   Potassium 3.9 3.5 - 5.2 mmol/L   Chloride 100 96 - 106 mmol/L   CO2 23 20 - 29 mmol/L   Calcium 9.6 8.7 - 10.3 mg/dL   Total Protein 7.5 6.0 - 8.5 g/dL   Albumin 4.4 3.7 - 4.7 g/dL   Globulin, Total 3.1 1.5 - 4.5 g/dL   Albumin/Globulin Ratio 1.4 1.2 - 2.2   Bilirubin Total 0.6 0.0 - 1.2 mg/dL   Alkaline Phosphatase 80  44 - 121 IU/L   AST 21 0 - 40 IU/L   ALT 17 0 - 32 IU/L  Thyroid Panel With TSH  Result Value Ref Range   TSH 3.330 0.450 - 4.500 uIU/mL   T4, Total 8.7 4.5 - 12.0 ug/dL   T3 Uptake Ratio 30 24 - 39 %   Free Thyroxine Index 2.6 1.2 - 4.9  Lipid panel  Result Value Ref Range   Cholesterol, Total 139 100 - 199 mg/dL   Triglycerides 829 0 - 149 mg/dL   HDL 43 >56 mg/dL   VLDL Cholesterol Cal 25 5 - 40 mg/dL   LDL Chol Calc (NIH) 71 0 - 99 mg/dL   Chol/HDL Ratio 3.2 0.0 - 4.4 ratio       Pertinent labs & imaging results that were available during my care of the patient were reviewed by me and considered in my medical decision making.  Assessment & Plan:  Saudia was seen today for annual exam.  Assessment and Plan    Hypertension Mildly elevated blood pressure. No symptoms of end-organ damage. Currently on Zestoretic. -Continue Zestoretic. -Provide information on DASH diet to help manage blood pressure.  Hypothyroidism No symptoms suggestive of poor control. Currently on Synthroid. -Check thyroid function today. -Refill Synthroid prescription based on lab results.  Recurrent Urinary Tract Infections No recent infections. Currently using cranberry supplements. -Continue cranberry supplements.  Right Knee Pain Chronic, intermittent pain. No recent injuries. Using ice packs and various creams for relief. -Consider use of Tylenol Arthritis as needed. -Continue use of ice packs and creams as tolerated.  General Health Maintenance -Administer influenza vaccine today. -Refill all medications. -Plan follow-up in 6 months unless something comes up.        Diagnoses and all orders for this visit:  Annual physical exam -     CMP14+EGFR -     CBC with Differential/Platelet -     Lipid panel -     Thyroid Panel With TSH -     VITAMIN D 25 Hydroxy (Vit-D Deficiency, Fractures) -     Flu Vaccine Trivalent High Dose (Fluad)  Mixed hyperlipidemia -     atorvastatin  (LIPITOR) 10 MG tablet; Take 1 tablet (10 mg total) by mouth daily. -     CMP14+EGFR -     Lipid panel  Elevated lipoprotein(a) -     atorvastatin (LIPITOR) 10 MG tablet; Take 1 tablet (10 mg total) by mouth daily. -     Lipid panel  Acquired hypothyroidism -     levothyroxine (SYNTHROID) 75 MCG tablet; Take 1 tablet (75 mcg total) by mouth daily. -     Thyroid Panel With TSH  Essential hypertension -     lisinopril-hydrochlorothiazide (ZESTORETIC) 20-25 MG tablet; Take 1 tablet by mouth daily. -     CMP14+EGFR -     CBC with Differential/Platelet -  Lipid panel -     Thyroid Panel With TSH  Vitamin D deficiency -     VITAMIN D 25 Hydroxy (Vit-D Deficiency, Fractures)  Chronic pain of right knee  Encounter for immunization -     Flu Vaccine Trivalent High Dose (Fluad)     Continue all other maintenance medications.  Follow up plan: Return in 6 months (on 03/11/2024), or if symptoms worsen or fail to improve, for chronic follow up.   Continue healthy lifestyle choices, including diet (rich in fruits, vegetables, and lean proteins, and low in salt and simple carbohydrates) and exercise (at least 30 minutes of moderate physical activity daily).  Educational handout given for DASH diet  The above assessment and management plan was discussed with the patient. The patient verbalized understanding of and has agreed to the management plan. Patient is aware to call the clinic if they develop any new symptoms or if symptoms persist or worsen. Patient is aware when to return to the clinic for a follow-up visit. Patient educated on when it is appropriate to go to the emergency department.   Kari Baars, FNP-C [facility] [phone]

## 2023-09-11 NOTE — Patient Instructions (Signed)

## 2023-09-12 LAB — CBC WITH DIFFERENTIAL/PLATELET
Basophils Absolute: 0 10*3/uL (ref 0.0–0.2)
Basos: 1 %
EOS (ABSOLUTE): 0.2 10*3/uL (ref 0.0–0.4)
Eos: 3 %
Hematocrit: 41.5 % (ref 34.0–46.6)
Hemoglobin: 13.8 g/dL (ref 11.1–15.9)
Immature Grans (Abs): 0 10*3/uL (ref 0.0–0.1)
Immature Granulocytes: 0 %
Lymphocytes Absolute: 1.1 10*3/uL (ref 0.7–3.1)
Lymphs: 19 %
MCH: 29.6 pg (ref 26.6–33.0)
MCHC: 33.3 g/dL (ref 31.5–35.7)
MCV: 89 fL (ref 79–97)
Monocytes Absolute: 0.4 10*3/uL (ref 0.1–0.9)
Monocytes: 6 %
Neutrophils Absolute: 4 10*3/uL (ref 1.4–7.0)
Neutrophils: 71 %
Platelets: 133 10*3/uL — ABNORMAL LOW (ref 150–450)
RBC: 4.66 x10E6/uL (ref 3.77–5.28)
RDW: 13.5 % (ref 11.7–15.4)
WBC: 5.7 10*3/uL (ref 3.4–10.8)

## 2023-09-12 LAB — CMP14+EGFR
ALT: 13 [IU]/L (ref 0–32)
AST: 19 [IU]/L (ref 0–40)
Albumin: 4.1 g/dL (ref 3.7–4.7)
Alkaline Phosphatase: 79 [IU]/L (ref 44–121)
BUN/Creatinine Ratio: 15 (ref 12–28)
BUN: 15 mg/dL (ref 8–27)
Bilirubin Total: 0.4 mg/dL (ref 0.0–1.2)
CO2: 24 mmol/L (ref 20–29)
Calcium: 9.3 mg/dL (ref 8.7–10.3)
Chloride: 102 mmol/L (ref 96–106)
Creatinine, Ser: 1.01 mg/dL — ABNORMAL HIGH (ref 0.57–1.00)
Globulin, Total: 2.8 g/dL (ref 1.5–4.5)
Glucose: 89 mg/dL (ref 70–99)
Potassium: 3.8 mmol/L (ref 3.5–5.2)
Sodium: 142 mmol/L (ref 134–144)
Total Protein: 6.9 g/dL (ref 6.0–8.5)
eGFR: 56 mL/min/{1.73_m2} — ABNORMAL LOW (ref >59)

## 2023-09-12 LAB — LIPID PANEL
Chol/HDL Ratio: 3.6 {ratio} (ref 0.0–4.4)
Cholesterol, Total: 136 mg/dL (ref 100–199)
HDL: 38 mg/dL — ABNORMAL LOW (ref >39)
LDL Chol Calc (NIH): 64 mg/dL (ref 0–99)
Triglycerides: 207 mg/dL — ABNORMAL HIGH (ref 0–149)
VLDL Cholesterol Cal: 34 mg/dL (ref 5–40)

## 2023-09-12 LAB — THYROID PANEL WITH TSH
Free Thyroxine Index: 2.3 (ref 1.2–4.9)
T3 Uptake Ratio: 29 % (ref 24–39)
T4, Total: 8 ug/dL (ref 4.5–12.0)
TSH: 3.18 u[IU]/mL (ref 0.450–4.500)

## 2023-09-12 LAB — VITAMIN D 25 HYDROXY (VIT D DEFICIENCY, FRACTURES): Vit D, 25-Hydroxy: 50.3 ng/mL (ref 30.0–100.0)

## 2023-09-15 NOTE — Addendum Note (Signed)
Addended by: Lorelee Cover C on: 09/15/2023 04:13 PM   Modules accepted: Orders

## 2023-09-29 ENCOUNTER — Other Ambulatory Visit: Payer: Medicare Other

## 2023-09-29 DIAGNOSIS — N289 Disorder of kidney and ureter, unspecified: Secondary | ICD-10-CM

## 2023-09-30 LAB — CBC WITH DIFFERENTIAL/PLATELET
Basophils Absolute: 0 10*3/uL (ref 0.0–0.2)
Basos: 1 %
EOS (ABSOLUTE): 0.1 10*3/uL (ref 0.0–0.4)
Eos: 2 %
Hematocrit: 42.3 % (ref 34.0–46.6)
Hemoglobin: 13.8 g/dL (ref 11.1–15.9)
Immature Grans (Abs): 0 10*3/uL (ref 0.0–0.1)
Immature Granulocytes: 0 %
Lymphocytes Absolute: 1.5 10*3/uL (ref 0.7–3.1)
Lymphs: 26 %
MCH: 29.1 pg (ref 26.6–33.0)
MCHC: 32.6 g/dL (ref 31.5–35.7)
MCV: 89 fL (ref 79–97)
Monocytes Absolute: 0.4 10*3/uL (ref 0.1–0.9)
Monocytes: 8 %
Neutrophils Absolute: 3.5 10*3/uL (ref 1.4–7.0)
Neutrophils: 63 %
Platelets: 144 10*3/uL — ABNORMAL LOW (ref 150–450)
RBC: 4.74 x10E6/uL (ref 3.77–5.28)
RDW: 13.4 % (ref 11.7–15.4)
WBC: 5.5 10*3/uL (ref 3.4–10.8)

## 2023-09-30 LAB — BMP8+EGFR
BUN/Creatinine Ratio: 18 (ref 12–28)
BUN: 21 mg/dL (ref 8–27)
CO2: 24 mmol/L (ref 20–29)
Calcium: 9.2 mg/dL (ref 8.7–10.3)
Chloride: 103 mmol/L (ref 96–106)
Creatinine, Ser: 1.14 mg/dL — ABNORMAL HIGH (ref 0.57–1.00)
Glucose: 90 mg/dL (ref 70–99)
Potassium: 4 mmol/L (ref 3.5–5.2)
Sodium: 144 mmol/L (ref 134–144)
eGFR: 48 mL/min/{1.73_m2} — ABNORMAL LOW (ref >59)

## 2023-10-23 ENCOUNTER — Ambulatory Visit (INDEPENDENT_AMBULATORY_CARE_PROVIDER_SITE_OTHER): Payer: Medicare Other | Admitting: Family Medicine

## 2023-10-23 ENCOUNTER — Encounter: Payer: Self-pay | Admitting: Family Medicine

## 2023-10-23 VITALS — BP 133/74 | HR 95 | Temp 97.9°F | Ht 66.0 in | Wt 168.8 lb

## 2023-10-23 DIAGNOSIS — J069 Acute upper respiratory infection, unspecified: Secondary | ICD-10-CM | POA: Diagnosis not present

## 2023-10-23 MED ORDER — BENZONATATE 100 MG PO CAPS: 100.0000 mg | ORAL_CAPSULE | Freq: Two times a day (BID) | ORAL | 0 refills | Status: DC | PRN

## 2023-10-23 MED ORDER — AZITHROMYCIN 250 MG PO TABS: ORAL_TABLET | ORAL | 0 refills | Status: AC

## 2023-10-23 NOTE — Progress Notes (Signed)
Acute Office Visit  Subjective:     Patient ID: Hannah [name], female    DOB: 1941/03/21, 82 y.o.   MRN: [id number]  Chief Complaint  Patient presents with   Cough    4 days slight drainage     Cough This is a new problem. Episode onset: 4 days ago. The problem has been unchanged. The problem occurs every few minutes. The cough is Non-productive. Associated symptoms include nasal congestion, postnasal drip, rhinorrhea and shortness of breath (mild with coughing fit). Pertinent negatives include no chest pain, chills, ear congestion, ear pain, fever, headaches, heartburn, hemoptysis, myalgias, rash, sore throat, sweats, weight loss or wheezing. Nothing aggravates the symptoms. Treatments tried: sinex severe. The treatment provided mild relief. There is no history of asthma, bronchiectasis, bronchitis, COPD, emphysema or pneumonia.    Review of Systems  Constitutional:  Negative for chills, fever and weight loss.  HENT:  Positive for postnasal drip and rhinorrhea. Negative for ear pain and sore throat.   Respiratory:  Positive for cough and shortness of breath (mild with coughing fit). Negative for hemoptysis and wheezing.   Cardiovascular:  Negative for chest pain.  Gastrointestinal:  Negative for heartburn.  Musculoskeletal:  Negative for myalgias.  Skin:  Negative for rash.  Neurological:  Negative for headaches.        Objective:    BP 133/74   Pulse 100   Temp 97.9 F (36.6 C) (Temporal)   Ht 5\' 6"  (1.676 m)   Wt 168 lb 12.8 oz (76.6 kg)   SpO2 91%   BMI 27.25 kg/m  SpO2 Readings from Last 3 Encounters:  10/23/23 91%  09/11/23 97%  03/12/23 96%   Pulse Readings from Last 3 Encounters:  10/23/23 100  09/11/23 72  03/12/23 88     Physical Exam Vitals and nursing note reviewed.  Constitutional:      General: She is not in acute distress.    Appearance: Normal appearance. She is not ill-appearing, toxic-appearing or diaphoretic.  HENT:     Head:  Normocephalic and atraumatic.     Right Ear: Tympanic membrane, ear canal and external ear normal.     Left Ear: Tympanic membrane, ear canal and external ear normal.     Nose: Congestion present.     Right Sinus: No maxillary sinus tenderness or frontal sinus tenderness.     Left Sinus: No maxillary sinus tenderness or frontal sinus tenderness.     Mouth/Throat:     Mouth: Mucous membranes are moist.     Pharynx: Oropharynx is clear. No oropharyngeal exudate or posterior oropharyngeal erythema.  Eyes:     General:        Right eye: No discharge.        Left eye: No discharge.     Conjunctiva/sclera: Conjunctivae normal.  Cardiovascular:     Rate and Rhythm: Regular rhythm. Tachycardia present.     Heart sounds: Normal heart sounds. No murmur heard. Pulmonary:     Effort: Pulmonary effort is normal. No respiratory distress.     Breath sounds: Normal breath sounds. No wheezing, rhonchi or rales.  Chest:     Chest wall: No tenderness.  Abdominal:     General: Bowel sounds are normal.     Palpations: Abdomen is soft.  Musculoskeletal:     Cervical back: Neck supple. No rigidity.     Right lower leg: No edema.     Left lower leg: No edema.  Lymphadenopathy:     Cervical:  No cervical adenopathy.  Skin:    General: Skin is warm and dry.  Neurological:     General: No focal deficit present.     Mental Status: She is alert and oriented to person, place, and time.  Psychiatric:        Mood and Affect: Mood normal.        Behavior: Behavior normal.     No results found for any visits on 10/23/23.      Assessment & Plan:   [name] was seen today for cough.  Diagnoses and all orders for this visit:  Viral URI with cough Unfortunately she will be outside of the window for antiviral treatment by the time Covid PCR test results. Discussed symptomatic care for viral illness. Pocket script given if her symptoms worsen or do not improve after 2 days. Zpak provided due to PCN  allergy. Return to office for new or worsening symptoms, or if symptoms persist.  -     benzonatate (TESSALON) 100 MG capsule; Take 1 capsule (100 mg total) by mouth 2 (two) times daily as needed for cough. -     azithromycin (ZITHROMAX) 250 MG tablet; Take 2 tablets on day 1, then 1 tablet daily on days 2 through 5  Return if symptoms worsen or fail to improve.  The patient indicates understanding of these issues and agrees with the plan.  Gabriel Earing, FNP

## 2023-11-17 ENCOUNTER — Other Ambulatory Visit (HOSPITAL_COMMUNITY): Payer: Self-pay | Admitting: Nephrology

## 2023-11-17 DIAGNOSIS — N1831 Chronic kidney disease, stage 3a: Secondary | ICD-10-CM

## 2023-11-17 DIAGNOSIS — R809 Proteinuria, unspecified: Secondary | ICD-10-CM

## 2023-11-17 DIAGNOSIS — I5032 Chronic diastolic (congestive) heart failure: Secondary | ICD-10-CM

## 2023-11-26 ENCOUNTER — Ambulatory Visit (HOSPITAL_COMMUNITY)
Admission: RE | Admit: 2023-11-26 | Discharge: 2023-11-26 | Disposition: A | Discharge status: Home / Self Care | Payer: Medicare Other | Source: Ambulatory Visit | Attending: Nephrology | Admitting: Nephrology

## 2023-11-26 DIAGNOSIS — N1831 Chronic kidney disease, stage 3a: Secondary | ICD-10-CM | POA: Insufficient documentation

## 2023-11-26 DIAGNOSIS — N189 Chronic kidney disease, unspecified: Secondary | ICD-10-CM | POA: Diagnosis not present

## 2023-11-26 DIAGNOSIS — I5032 Chronic diastolic (congestive) heart failure: Secondary | ICD-10-CM | POA: Diagnosis not present

## 2023-11-26 DIAGNOSIS — R809 Proteinuria, unspecified: Secondary | ICD-10-CM | POA: Insufficient documentation

## 2023-12-08 DIAGNOSIS — H3589 Other specified retinal disorders: Secondary | ICD-10-CM | POA: Diagnosis not present

## 2023-12-08 DIAGNOSIS — H5211 Myopia, right eye: Secondary | ICD-10-CM | POA: Diagnosis not present

## 2024-01-11 ENCOUNTER — Ambulatory Visit (INDEPENDENT_AMBULATORY_CARE_PROVIDER_SITE_OTHER): Payer: Medicare Other

## 2024-01-11 VITALS — Ht 66.0 in | Wt 168.0 lb

## 2024-01-11 DIAGNOSIS — Z0000 Encounter for general adult medical examination without abnormal findings: Secondary | ICD-10-CM | POA: Diagnosis not present

## 2024-01-11 NOTE — Progress Notes (Signed)
 Subjective:   Hannah [name] is a 83 y.o. female who presents for Medicare Annual (Subsequent) preventive examination.  Visit Complete: Virtual I connected with  Hannah [name] on 01/11/24 by a audio enabled telemedicine application and verified that I am speaking with the correct person using two identifiers.  Patient Location: Home  Provider Location: Home Office  I discussed the limitations of evaluation and management by telemedicine. The patient expressed understanding and agreed to proceed.  Vital Signs: Because this visit was a virtual/telehealth visit, some criteria may be missing or patient reported. Any vitals not documented were not able to be obtained and vitals that have been documented are patient reported.  Cardiac Risk Factors include: advanced age (>72men, >75 women);hypertension;dyslipidemia     Objective:    Today's Vitals   01/11/24 1434  Weight: 168 lb (76.2 kg)  Height: 5\' 6"  (1.676 m)   Body mass index is 27.12 kg/m.     01/11/2024    2:43 PM 01/05/2023    2:29 PM 01/02/2022    2:06 PM 12/31/2020    8:24 AM 10/24/2019   10:15 AM 06/22/2019   10:16 AM 06/20/2019   10:58 PM  Advanced Directives  Does Patient Have a Medical Advance Directive? No No No No No No No  Would patient like information on creating a medical advance directive? Yes (MAU/Ambulatory/Procedural Areas - Information given) No - Patient declined No - Patient declined No - Patient declined No - Patient declined  No - Guardian declined    Current Medications (verified) Outpatient Encounter Medications as of 01/11/2024  Medication Sig   atorvastatin  (LIPITOR) 10 MG tablet Take 1 tablet (10 mg total) by mouth daily.   Calcium  Carb-Cholecalciferol 600-400 MG-UNIT TABS Take 1 tablet by mouth daily.    Cranberry 500 MG CAPS Take by mouth.   levothyroxine  (SYNTHROID ) 75 MCG tablet Take 1 tablet (75 mcg total) by mouth daily.   lisinopril -hydrochlorothiazide  (ZESTORETIC ) 20-25 MG tablet Take 1  tablet by mouth daily.   Multiple Vitamin (MULTIVITAMIN) tablet Take 1 tablet by mouth daily.   benzonatate  (TESSALON ) 100 MG capsule Take 1 capsule (100 mg total) by mouth 2 (two) times daily as needed for cough. (Patient not taking: Reported on 01/11/2024)   No facility-administered encounter medications on file as of 01/11/2024.    Allergies (verified) Penicillins and Sulfa antibiotics   History: Past Medical History:  Diagnosis Date   Cataract    Cataracts, bilateral    Hyperlipidemia    Hypertension    Incarcerated umbilical hernia 06/21/2019   Thyroid  disease    Past Surgical History:  Procedure Laterality Date   ABDOMINAL HYSTERECTOMY     APPENDECTOMY     BLADDER SURGERY     EYE SURGERY     Cataracts   HERNIA REPAIR     UMBILICAL HERNIA REPAIR N/A 06/22/2019   Procedure: HERNIA REPAIR UMBILICAL ADULT;  Surgeon: Alanda Allegra, MD;  Location: [facility];  Service: General;  Laterality: N/A;   Family History  Problem Relation Age of Onset   Stroke Mother    Heart disease Mother    Heart disease Father    Stroke Father    Bone cancer Father    Cervical cancer Sister    Aneurysm Brother    Social History   Socioeconomic History   Marital status: Widowed    Spouse name: Not on file   Number of children: 1   Years of education: 61   Highest education level: Not on file  Occupational History   Occupation: Retired  Tobacco Use   Smoking status: Never   Smokeless tobacco: Never  Vaping Use   Vaping status: Never Used  Substance and Sexual Activity   Alcohol use: Never   Drug use: Never   Sexual activity: Not Currently  Other Topics Concern   Not on file  Social History Narrative   Lives alone - duplex - only 2 stairs to come in   Neighbor is handicap, so she mows his yard and helps him when needed   Very independent - no cane or walker    Daughter lives 5 minutes away   Social Drivers of Health   Financial Resource Strain: Low Risk  (01/11/2024)   Overall  Financial Resource Strain (CARDIA)    Difficulty of Paying Living Expenses: Not hard at all  Food Insecurity: No Food Insecurity (01/11/2024)   Hunger Vital Sign    Worried About Running Out of Food in the Last Year: Never true    Ran Out of Food in the Last Year: Never true  Transportation Needs: No Transportation Needs (01/11/2024)   PRAPARE - Administrator, Civil Service (Medical): No    Lack of Transportation (Non-Medical): No  Physical Activity: Sufficiently Active (01/11/2024)   Exercise Vital Sign    Days of Exercise per Week: 5 days    Minutes of Exercise per Session: 30 min  Stress: No Stress Concern Present (01/11/2024)   [employer] of Occupational Health - Occupational Stress Questionnaire    Feeling of Stress : Not at all  Social Connections: Moderately Isolated (01/11/2024)   Social Connection and Isolation Panel [NHANES]    Frequency of Communication with Friends and Family: More than three times a week    Frequency of Social Gatherings with Friends and Family: Three times a week    Attends Religious Services: More than 4 times per year    Active Member of Clubs or Organizations: No    Attends Banker Meetings: Never    Marital Status: Widowed    Tobacco Counseling Counseling given: Not Answered   Clinical Intake:  Pre-visit preparation completed: Yes  Pain : No/denies pain     Diabetes: No  How often do you need to have someone help you when you read instructions, pamphlets, or other written materials from your doctor or pharmacy?: 1 - Never  Interpreter Needed?: No  Information entered by :: Seabron Cypress LPN   Activities of Daily Living    01/11/2024    2:43 PM  In your present state of health, do you have any difficulty performing the following activities:  Hearing? 0  Vision? 0  Difficulty concentrating or making decisions? 0  Walking or climbing stairs? 0  Dressing or bathing? 0  Doing errands, shopping? 0   Preparing Food and eating ? N  Using the Toilet? N  In the past six months, have you accidently leaked urine? N  Do you have problems with loss of bowel control? N  Managing your Medications? N  Managing your Finances? N  Housekeeping or managing your Housekeeping? N    Patient Care Team: Galvin Jules, FNP as PCP - General (Family Medicine) Dr [facility], OD (Optometry)  Indicate any recent Medical Services you may have received from other than [facility] providers in the past year (date may be approximate).     Assessment:   This is a routine wellness examination for [place].  Hearing/Vision screen Hearing Screening -  Comments:: Denies hearing difficulties   Vision Screening - Comments:: Wears rx glasses - up to date with routine eye exams with Dr. Patria Bookbinder     Goals Addressed             This Visit's Progress    COMPLETED: AWV       10/24/2019 AWV Goal: Fall Prevention  Over the next year, patient will decrease their risk for falls by: Using assistive devices, such as a cane or walker, as needed Identifying fall risks within their home and correcting them by: Removing throw rugs Adding handrails to stairs or ramps Removing clutter and keeping a clear pathway throughout the home Increasing light, especially at night Adding shower handles/bars Raising toilet seat Identifying potential personal risk factors for falls: Medication side effects Incontinence/urgency Vestibular dysfunction Hearing loss Musculoskeletal disorders Neurological disorders Orthostatic hypotension       COMPLETED: AWV       01/02/2022 AWV Goal: Fall Prevention  Over the next year, patient will decrease their risk for falls by: Using assistive devices, such as a cane or walker, as needed Identifying fall risks within their home and correcting them by: Removing throw rugs Adding handrails to stairs or ramps Removing clutter and keeping a clear pathway  throughout the home Increasing light, especially at night Adding shower handles/bars Raising toilet seat Identifying potential personal risk factors for falls: Medication side effects Incontinence/urgency Vestibular dysfunction Hearing loss Musculoskeletal disorders Neurological disorders Orthostatic hypotension  12/31/2020 AWV Goal: Exercise for General Health  Patient will verbalize understanding of the benefits of increased physical activity: Exercising regularly is important. It will improve your overall fitness, flexibility, and endurance. Regular exercise also will improve your overall health. It can help you control your weight, reduce stress, and improve your bone density. Over the next year, patient will increase physical activity as tolerated with a goal of at least 150 minutes of moderate physical activity per week.  You can tell that you are exercising at a moderate intensity if your heart starts beating faster and you start breathing faster but can still hold a conversation. Moderate-intensity exercise ideas include: Walking 1 mile (1.6 km) in about 15 minutes Biking Hiking Golfing Dancing Water aerobics Patient will verbalize understanding of everyday activities that increase physical activity by providing examples like the following: Yard work, such as: Insurance underwriter Gardening Washing windows or floors Patient will be able to explain general safety guidelines for exercising:  Before you start a new exercise program, talk with your health care provider. Do not exercise so much that you hurt yourself, feel dizzy, or get very short of breath. Wear comfortable clothes and wear shoes with good support. Drink plenty of water while you exercise to prevent dehydration or heat stroke. Work out until your breathing and your heartbeat get faster.      Remain active and independent         Depression Screen    01/11/2024    2:36 PM 10/23/2023   10:35 AM 09/11/2023   10:13 AM 03/12/2023   10:53 AM 01/05/2023    2:29 PM 07/24/2022    9:26 AM 02/21/2022   10:14 AM  PHQ 2/9 Scores  PHQ - 2 Score 4 4 0 2 0 0 0  PHQ- 9 Score 9 10 0 2  0 0    Fall Risk    01/11/2024    2:42 PM 09/11/2023  10:13 AM 03/12/2023   10:53 AM 01/05/2023    2:28 PM 07/24/2022    9:27 AM  Fall Risk   Falls in the past year? 0 0 0 0 0  Number falls in past yr: 0   0   Injury with Fall? 0   0   Risk for fall due to : No Fall Risks      Follow up Falls prevention discussed;Education provided;Falls evaluation completed   Falls prevention discussed;Education provided;Falls evaluation completed     MEDICARE RISK AT HOME: Medicare Risk at Home Any stairs in or around the home?: No If so, are there any without handrails?: No Home free of loose throw rugs in walkways, pet beds, electrical cords, etc?: Yes Adequate lighting in your home to reduce risk of falls?: Yes Life alert?: No Use of a cane, walker or w/c?: No Grab bars in the bathroom?: Yes Shower chair or bench in shower?: No Elevated toilet seat or a handicapped toilet?: Yes  TIMED UP AND GO:  Was the test performed?  No    Cognitive Function:        01/11/2024    2:43 PM 01/05/2023    2:30 PM 01/02/2022    2:08 PM 12/31/2020    8:27 AM 10/24/2019   10:17 AM  6CIT Screen  What Year? 0 points 0 points 0 points 0 points 0 points  What month? 0 points 0 points 0 points 0 points 0 points  What time? 0 points 0 points 0 points 0 points 0 points  Count back from 20 0 points 0 points 0 points 0 points 2 points  Months in reverse 0 points 0 points 0 points 0 points 0 points  Repeat phrase 0 points 0 points 2 points 2 points 4 points  Total Score 0 points 0 points 2 points 2 points 6 points    Immunizations Immunization History  Administered Date(s) Administered   Fluad Quad(high Dose 65+) 09/10/2016, 08/10/2017, 09/09/2018,  09/08/2019, 09/07/2020, 09/10/2021, 09/09/2022   Fluad Trivalent(High Dose 65+) 09/14/2014, 09/03/2015, 09/11/2023   Influenza Split 09/12/2020   Influenza, High Dose Seasonal PF 09/10/2016, 08/10/2017, 09/09/2018   Influenza, Seasonal, Injecte, Preservative Fre 09/14/2014, 09/03/2015   Influenza-Unspecified 09/14/2014, 09/03/2015   Moderna Sars-Covid-2 Vaccination 09/13/2019, 02/22/2020, 03/19/2020, 10/22/2020   Pneumococcal Conjugate-13 08/03/2015   Pneumococcal Polysaccharide-23 12/09/2016   Tdap 08/03/2015    TDAP status: Up to date  Flu Vaccine status: Up to date  Pneumococcal vaccine status: Up to date  Covid-19 vaccine status: Information provided on how to obtain vaccines.   Qualifies for Shingles Vaccine? Yes   Zostavax completed No   Shingrix Completed?: No.    Education has been provided regarding the importance of this vaccine. Patient has been advised to call insurance company to determine out of pocket expense if they have not yet received this vaccine. Advised may also receive vaccine at local pharmacy or Health Dept. Verbalized acceptance and understanding.  Screening Tests Health Maintenance  Topic Date Due   Zoster Vaccines- Shingrix (1 of 2) Never done   COVID-19 Vaccine (5 - 2024-25 season) 08/02/2023   DEXA SCAN  12/24/2024   Medicare Annual Wellness (AWV)  01/10/2025   DTaP/Tdap/Td (2 - Td or Tdap) 08/02/2025   Pneumonia Vaccine 65+ Years old  Completed   INFLUENZA VACCINE  Completed   HPV VACCINES  Aged Out    Health Maintenance  Health Maintenance Due  Topic Date Due   Zoster Vaccines- Shingrix (1 of 2) Never  done   COVID-19 Vaccine (5 - 2024-25 season) 08/02/2023    Colorectal cancer screening: No longer required.   Mammogram status: No longer required due to age and preference.  Bone Density status: Completed 12/24/21. Results reflect: Bone density results: NORMAL. Repeat every 5 years.  Lung Cancer Screening: (Low Dose CT Chest recommended  if Age 28-80 years, 20 pack-year currently smoking OR have quit w/in 15years.) does not qualify.   Lung Cancer Screening Referral: n/a  Additional Screening:  Hepatitis C Screening: does not qualify  Vision Screening: Recommended annual ophthalmology exams for early detection of glaucoma and other disorders of the eye. Is the patient up to date with their annual eye exam?  Yes  Who is the provider or what is the name of the office in which the patient attends annual eye exams? Dr. Patria Bookbinder  If pt is not established with a provider, would they like to be referred to a provider to establish care? No .   Dental Screening: Recommended annual dental exams for proper oral hygiene  Community Resource Referral / Chronic Care Management: CRR required this visit?  No   CCM required this visit?  No     Plan:     I have personally reviewed and noted the following in the patient's chart:   Medical and social history Use of alcohol, tobacco or illicit drugs  Current medications and supplements including opioid prescriptions. Patient is not currently taking opioid prescriptions. Functional ability and status Nutritional status Physical activity Advanced directives List of other physicians Hospitalizations, surgeries, and ER visits in previous 12 months Vitals Screenings to include cognitive, depression, and falls Referrals and appointments  In addition, I have reviewed and discussed with patient certain preventive protocols, quality metrics, and best practice recommendations. A written personalized care plan for preventive services as well as general preventive health recommendations were provided to patient.     Seabron Cypress [place], [place]   1/61/0960   After Visit Summary: (MyChart) Due to this being a telephonic visit, the after visit summary with patients personalized plan was offered to patient via MyChart   Nurse Notes: No concerns at this time

## 2024-01-11 NOTE — Patient Instructions (Signed)
 Hannah [name] , Thank you for taking time to come for your Medicare Wellness Visit. I appreciate your ongoing commitment to your health goals. Please review the following plan we discussed and let me know if I can assist you in the future.   Referrals/Orders/Follow-Ups/Clinician Recommendations: Aim for 30 minutes of exercise or brisk walking, 6-8 glasses of water, and 5 servings of fruits and vegetables each day.  This is a list of the screening recommended for you and due dates:  Health Maintenance  Topic Date Due   Zoster (Shingles) Vaccine (1 of 2) Never done   COVID-19 Vaccine (5 - 2024-25 season) 08/02/2023   DEXA scan (bone density measurement)  12/24/2024   Medicare Annual Wellness Visit  01/10/2025   DTaP/Tdap/Td vaccine (2 - Td or Tdap) 08/02/2025   Pneumonia Vaccine  Completed   Flu Shot  Completed   HPV Vaccine  Aged Out    Advanced directives: (ACP Link)Information on Advanced Care Planning can be found at [place]  Secretary of [facility] Advance Health Care Directives Advance Health Care Directives ([email or web address]   Next Medicare Annual Wellness Visit scheduled for next year: Yes

## 2024-01-27 DIAGNOSIS — R778 Other specified abnormalities of plasma proteins: Secondary | ICD-10-CM | POA: Diagnosis not present

## 2024-01-27 DIAGNOSIS — N189 Chronic kidney disease, unspecified: Secondary | ICD-10-CM | POA: Diagnosis not present

## 2024-02-04 DIAGNOSIS — I5032 Chronic diastolic (congestive) heart failure: Secondary | ICD-10-CM | POA: Diagnosis not present

## 2024-02-04 DIAGNOSIS — R809 Proteinuria, unspecified: Secondary | ICD-10-CM | POA: Diagnosis not present

## 2024-02-04 DIAGNOSIS — N1831 Chronic kidney disease, stage 3a: Secondary | ICD-10-CM | POA: Diagnosis not present

## 2024-02-04 DIAGNOSIS — I129 Hypertensive chronic kidney disease with stage 1 through stage 4 chronic kidney disease, or unspecified chronic kidney disease: Secondary | ICD-10-CM | POA: Diagnosis not present

## 2024-02-25 ENCOUNTER — Encounter: Payer: Self-pay | Admitting: Family Medicine

## 2024-02-25 ENCOUNTER — Ambulatory Visit: Admitting: Family Medicine

## 2024-02-25 DIAGNOSIS — N760 Acute vaginitis: Secondary | ICD-10-CM | POA: Diagnosis not present

## 2024-02-25 DIAGNOSIS — R300 Dysuria: Secondary | ICD-10-CM

## 2024-02-25 LAB — URINALYSIS, ROUTINE W REFLEX MICROSCOPIC
Bilirubin, UA: NEGATIVE
Glucose, UA: NEGATIVE
Ketones, UA: NEGATIVE
Nitrite, UA: POSITIVE — AB
Specific Gravity, UA: 1.025 (ref 1.005–1.030)
Urobilinogen, Ur: 0.2 mg/dL (ref 0.2–1.0)
pH, UA: 5.5 (ref 5.0–7.5)

## 2024-02-25 LAB — MICROSCOPIC EXAMINATION
Renal Epithel, UA: NONE SEEN /HPF
WBC, UA: >30 /HPF — AB (ref 0–5)

## 2024-02-25 LAB — WET PREP FOR TRICH, YEAST, CLUE
Clue Cell Exam: NEGATIVE
Trichomonas Exam: NEGATIVE
Yeast Exam: POSITIVE — AB

## 2024-02-25 MED ORDER — CEPHALEXIN 500 MG PO CAPS: 500.0000 mg | ORAL_CAPSULE | Freq: Two times a day (BID) | ORAL | 0 refills | Status: AC

## 2024-02-25 MED ORDER — FLUCONAZOLE 150 MG PO TABS: 150.0000 mg | ORAL_TABLET | Freq: Once | ORAL | 0 refills | Status: AC

## 2024-02-25 NOTE — Progress Notes (Signed)
 Subjective:  Patient ID: Hannah [name], female    DOB: [birth year], 83 y.o.   MRN: [id number]  Patient Care Team: Sonny Masters, FNP as PCP - General (Family Medicine) Dr [facility], OD (Optometry)   Chief Complaint:  possible yeast infection   HPI: Hannah [name] is a 83 y.o. female presenting on 02/25/2024 for possible yeast infection  HPI 1. Acute vaginitis Reports that she has had yeast infection since 2011. Reports that she has incomplete itching. She was seeing a urologist previously. She is not taking prophylaxis. She is drinking cranberry juice. Symptoms started 3 days ago. She has burning, pain, cloudiness, increased frequency. Denies discharge, itching, odor. Denies N/V, fever. Endorses low back pain on her right side on day one, has improved.    Relevant past medical, surgical, family, and social history reviewed and updated as indicated.  Allergies and medications reviewed and updated. Data reviewed: Chart in Epic.   Past Medical History:  Diagnosis Date   Cataract    Cataracts, bilateral    Hyperlipidemia    Hypertension    Incarcerated umbilical hernia 06/21/2019   Thyroid disease     Past Surgical History:  Procedure Laterality Date   ABDOMINAL HYSTERECTOMY     APPENDECTOMY     BLADDER SURGERY     EYE SURGERY     Cataracts   HERNIA REPAIR     UMBILICAL HERNIA REPAIR N/A 06/22/2019   Procedure: HERNIA REPAIR UMBILICAL ADULT;  Surgeon: Franky Macho, MD;  Location: [facility];  Service: General;  Laterality: N/A;    Social History   Socioeconomic History   Marital status: Widowed    Spouse name: Not on file   Number of children: 1   Years of education: 75   Highest education level: Not on file  Occupational History   Occupation: Retired  Tobacco Use   Smoking status: Never   Smokeless tobacco: Never  Vaping Use   Vaping status: Never Used  Substance and Sexual Activity   Alcohol use: Never   Drug use: Never   Sexual  activity: Not Currently  Other Topics Concern   Not on file  Social History Narrative   Lives alone - duplex - only 2 stairs to come in   Neighbor is handicap, so she mows his yard and helps him when needed   Very independent - no cane or walker    Daughter lives 5 minutes away   Social Drivers of Health   Financial Resource Strain: Low Risk  (01/11/2024)   Overall Financial Resource Strain (CARDIA)    Difficulty of Paying Living Expenses: Not hard at all  Food Insecurity: No Food Insecurity (01/11/2024)   Hunger Vital Sign    Worried About Running Out of Food in the Last Year: Never true    Ran Out of Food in the Last Year: Never true  Transportation Needs: No Transportation Needs (01/11/2024)   PRAPARE - Administrator, Civil Service (Medical): No    Lack of Transportation (Non-Medical): No  Physical Activity: Sufficiently Active (01/11/2024)   Exercise Vital Sign    Days of Exercise per Week: 5 days    Minutes of Exercise per Session: 30 min  Stress: No Stress Concern Present (01/11/2024)   [employer] of Occupational Health - Occupational Stress Questionnaire    Feeling of Stress : Not at all  Social Connections: Moderately Isolated (01/11/2024)   Social Connection and Isolation Panel [NHANES]    Frequency  of Communication with Friends and Family: More than three times a week    Frequency of Social Gatherings with Friends and Family: Three times a week    Attends Religious Services: More than 4 times per year    Active Member of Clubs or Organizations: No    Attends Banker Meetings: Never    Marital Status: Widowed  Intimate Partner Violence: Not At Risk (01/11/2024)   Humiliation, Afraid, Rape, and Kick questionnaire    Fear of Current or Ex-Partner: No    Emotionally Abused: No    Physically Abused: No    Sexually Abused: No    Outpatient Encounter Medications as of 02/25/2024  Medication Sig   atorvastatin (LIPITOR) 10 MG tablet Take 1  tablet (10 mg total) by mouth daily.   Calcium Carb-Cholecalciferol 600-400 MG-UNIT TABS Take 1 tablet by mouth daily.    Cranberry 500 MG CAPS Take by mouth.   levothyroxine (SYNTHROID) 75 MCG tablet Take 1 tablet (75 mcg total) by mouth daily.   lisinopril-hydrochlorothiazide (ZESTORETIC) 20-25 MG tablet Take 1 tablet by mouth daily.   Multiple Vitamin (MULTIVITAMIN) tablet Take 1 tablet by mouth daily.   [DISCONTINUED] benzonatate (TESSALON) 100 MG capsule Take 1 capsule (100 mg total) by mouth 2 (two) times daily as needed for cough. (Patient not taking: Reported on 01/11/2024)   No facility-administered encounter medications on file as of 02/25/2024.    Allergies  Allergen Reactions   Penicillins Rash    Did it involve swelling of the face/tongue/throat, SOB, or low BP? Unknown Did it involve sudden or severe rash/hives, skin peeling, or any reaction on the inside of your mouth or nose? Ruta Hinds Did you need to seek medical attention at a hospital or doctor's office? No When did it last happen?  childhood If all above answers are "NO", may proceed with cephalosporin use.    Sulfa Antibiotics Rash    Review of Systems As per HPI  Objective:  BP 139/64   Pulse 94   Temp 98.5 F (36.9 C)   Ht 5\' 6"  (1.676 m)   Wt 170 lb (77.1 kg)   SpO2 94%   BMI 27.44 kg/m    Wt Readings from Last 3 Encounters:  02/25/24 170 lb (77.1 kg)  01/11/24 168 lb (76.2 kg)  10/23/23 168 lb 12.8 oz (76.6 kg)    Physical Exam Constitutional:      General: She is awake. She is not in acute distress.    Appearance: Normal appearance. She is well-developed, well-groomed and overweight. She is not ill-appearing, toxic-appearing or diaphoretic.  Cardiovascular:     Rate and Rhythm: Normal rate and regular rhythm.     Pulses: Normal pulses.          Radial pulses are 2+ on the right side and 2+ on the left side.       Posterior tibial pulses are 2+ on the right side and 2+ on the left side.      Heart sounds: Normal heart sounds. No murmur heard.    No gallop.  Pulmonary:     Effort: Pulmonary effort is normal. No respiratory distress.     Breath sounds: Normal breath sounds. No stridor. No wheezing, rhonchi or rales.  Abdominal:     General: Abdomen is flat. Bowel sounds are normal.     Palpations: Abdomen is soft.     Tenderness: There is abdominal tenderness in the suprapubic area. There is no right CVA tenderness or left CVA  tenderness.  Musculoskeletal:     Cervical back: Full passive range of motion without pain and neck supple.     Right lower leg: No edema.     Left lower leg: No edema.  Skin:    General: Skin is warm.     Capillary Refill: Capillary refill takes less than 2 seconds.  Neurological:     General: No focal deficit present.     Mental Status: She is alert, oriented to person, place, and time and easily aroused. Mental status is at baseline.     GCS: GCS eye subscore is 4. GCS verbal subscore is 5. GCS motor subscore is 6.     Motor: No weakness.  Psychiatric:        Attention and Perception: Attention and perception normal.        Mood and Affect: Mood and affect normal.        Speech: Speech normal.        Behavior: Behavior normal. Behavior is cooperative.        Thought Content: Thought content normal. Thought content does not include homicidal or suicidal ideation. Thought content does not include homicidal or suicidal plan.        Cognition and Memory: Cognition and memory normal.        Judgment: Judgment normal.     Results for orders placed or performed in visit on 09/29/23  BMP8+EGFR   Collection Time: 09/29/23  9:21 AM  Result Value Ref Range   Glucose 90 70 - 99 mg/dL   BUN 21 8 - 27 mg/dL   Creatinine, Ser 1.91 (H) 0.57 - 1.00 mg/dL   eGFR 48 (L) >47 WG/NFA/2.13   BUN/Creatinine Ratio 18 12 - 28   Sodium 144 134 - 144 mmol/L   Potassium 4.0 3.5 - 5.2 mmol/L   Chloride 103 96 - 106 mmol/L   CO2 24 20 - 29 mmol/L   Calcium 9.2 8.7 -  10.3 mg/dL  CBC with Differential/Platelet   Collection Time: 09/29/23  9:21 AM  Result Value Ref Range   WBC 5.5 3.4 - 10.8 x10E3/uL   RBC 4.74 3.77 - 5.28 x10E6/uL   Hemoglobin 13.8 11.1 - 15.9 g/dL   Hematocrit 08.6 57.8 - 46.6 %   MCV 89 79 - 97 fL   MCH 29.1 26.6 - 33.0 pg   MCHC 32.6 31.5 - 35.7 g/dL   RDW 46.9 62.9 - 52.8 %   Platelets 144 (L) 150 - 450 x10E3/uL   Neutrophils 63 Not Estab. %   Lymphs 26 Not Estab. %   Monocytes 8 Not Estab. %   Eos 2 Not Estab. %   Basos 1 Not Estab. %   Neutrophils Absolute 3.5 1.4 - 7.0 x10E3/uL   Lymphocytes Absolute 1.5 0.7 - 3.1 x10E3/uL   Monocytes Absolute 0.4 0.1 - 0.9 x10E3/uL   EOS (ABSOLUTE) 0.1 0.0 - 0.4 x10E3/uL   Basophils Absolute 0.0 0.0 - 0.2 x10E3/uL   Immature Granulocytes 0 Not Estab. %   Immature Grans (Abs) 0.0 0.0 - 0.1 x10E3/uL       02/25/2024    9:57 AM 01/11/2024    2:36 PM 10/23/2023   10:35 AM 09/11/2023   10:13 AM 03/12/2023   10:53 AM  Depression screen PHQ 2/9  Decreased Interest 0 3 3 0 2  Down, Depressed, Hopeless 0 1 1 0 0  PHQ - 2 Score 0 4 4 0 2  Altered sleeping 0 2 3 0 0  Tired, decreased energy 0 2 2 0 0  Change in appetite 0 1 1 0 0  Feeling bad or failure about yourself  0 0 0 0 0  Trouble concentrating 0 0 0 0 0  Moving slowly or fidgety/restless 0 0 0 0 0  Suicidal thoughts 0 0 0 0 0  PHQ-9 Score 0 9 10 0 2  Difficult doing work/chores Not difficult at all  Somewhat difficult Not difficult at all Not difficult at all       02/25/2024    9:56 AM 10/23/2023   10:35 AM 09/11/2023   10:13 AM 03/12/2023   10:53 AM  GAD 7 : Generalized Anxiety Score  Nervous, Anxious, on Edge 2 2 2 2   Control/stop worrying 0 2 2 0  Worry too much - different things 0 1 1 0  Trouble relaxing 0 0 0 0  Restless 0 0 0 0  Easily annoyed or irritable  0 0 0  Afraid - awful might happen 0 0 0 0  Total GAD 7 Score  5 5 2   Anxiety Difficulty  Not difficult at all Not difficult at all Not difficult at all    Pertinent labs & imaging results that were available during my care of the patient were reviewed by me and considered in my medical decision making.  Assessment & Plan:  Hannah [name] was seen today for possible yeast infection.  Diagnoses and all orders for this visit:  Acute vaginitis Based on UA and wet prep, will treat as below. Will await culture to determine next steps. Patient to follow up with specialty for recurrent infections.  -     Urinalysis, Routine w reflex microscopic -     Urine Culture -     WET PREP FOR TRICH, YEAST, CLUE -     fluconazole (DIFLUCAN) 150 MG tablet; Take 1 tablet (150 mg total) by mouth once for 1 dose. May take second dose 72 hours after first dose if symptoms remain -     cephALEXin (KEFLEX) 500 MG capsule; Take 1 capsule (500 mg total) by mouth 2 (two) times daily for 7 days.  Dysuria As above.  -     Urinalysis, Routine w reflex microscopic -     Urine Culture -     WET PREP FOR TRICH, YEAST, CLUE -     fluconazole (DIFLUCAN) 150 MG tablet; Take 1 tablet (150 mg total) by mouth once for 1 dose. May take second dose 72 hours after first dose if symptoms remain -     cephALEXin (KEFLEX) 500 MG capsule; Take 1 capsule (500 mg total) by mouth 2 (two) times daily for 7 days.  Continue all other maintenance medications.  Follow up plan: Return if symptoms worsen or fail to improve.   Continue healthy lifestyle choices, including diet (rich in fruits, vegetables, and lean proteins, and low in salt and simple carbohydrates) and exercise (at least 30 minutes of moderate physical activity daily).  Written and verbal instructions provided   The above assessment and management plan was discussed with the patient. The patient verbalized understanding of and has agreed to the management plan. Patient is aware to call the clinic if they develop any new symptoms or if symptoms persist or worsen. Patient is aware when to return to the clinic for a follow-up visit.  Patient educated on when it is appropriate to go to the emergency department.   Neale Burly, DNP-FNP [facility] [address]  [place], [place] [address] [phone]

## 2024-02-28 ENCOUNTER — Other Ambulatory Visit: Payer: Self-pay | Admitting: Family Medicine

## 2024-02-28 DIAGNOSIS — I10 Essential (primary) hypertension: Secondary | ICD-10-CM

## 2024-02-29 LAB — URINE CULTURE

## 2024-03-01 ENCOUNTER — Encounter: Payer: Self-pay | Admitting: Family Medicine

## 2024-03-01 NOTE — Progress Notes (Signed)
 Please have patient come in for repeat culture to see if keflex cleared infection, if she is still symptomatic, will plan to treat for rocephin or cipro.

## 2024-03-01 NOTE — Addendum Note (Signed)
 Addended by: Neale Burly on: 03/01/2024 09:11 AM   Modules accepted: Orders

## 2024-03-02 ENCOUNTER — Other Ambulatory Visit: Payer: Self-pay | Admitting: Family Medicine

## 2024-03-02 DIAGNOSIS — E7841 Elevated Lipoprotein(a): Secondary | ICD-10-CM

## 2024-03-02 DIAGNOSIS — E782 Mixed hyperlipidemia: Secondary | ICD-10-CM

## 2024-03-08 ENCOUNTER — Encounter: Payer: Self-pay | Admitting: Urology

## 2024-03-08 ENCOUNTER — Ambulatory Visit: Admitting: Urology

## 2024-03-08 VITALS — BP 125/72 | HR 102

## 2024-03-08 DIAGNOSIS — R339 Retention of urine, unspecified: Secondary | ICD-10-CM

## 2024-03-08 DIAGNOSIS — N183 Chronic kidney disease, stage 3 unspecified: Secondary | ICD-10-CM | POA: Diagnosis not present

## 2024-03-08 DIAGNOSIS — Z87440 Personal history of urinary (tract) infections: Secondary | ICD-10-CM | POA: Diagnosis not present

## 2024-03-08 LAB — BLADDER SCAN AMB NON-IMAGING: Scan Result: 389

## 2024-03-08 NOTE — Progress Notes (Signed)
 Name: Hannah [name] DOB: 30-Dec-1940 MRN: [id number]  History of Present Illness: Hannah [name] is a 83 y.o. female who presents today as a new patient at [facility] . All available relevant medical records have been reviewed. She is accompanied by her daughter Hannah [name]. GU History includes: 1. CKD stage 3. Followed by Nephrology (Dr. Wolfgang Phoenix).   Per chart review: > RUS on 11/26/2023 showed:  -  Increased echogenicity of the bilateral renal cortices, consistent with medical renal disease. - No GU stones, masses, or hydronephrosis; bladder unremarkable.  > 01/27/2024:  - Urine microscopy: 0 WBC/hpf, 0 RBC/hpf, few bacteria. - Urine culture negative. - GFR 55, creatinine 1.02. - WBC 5.6.  > 02/25/2024:  - Urine culture positive for Klebsiella pneumoniae.   Today: She was referred by Nephrology for concerns of incomplete bladder emptying which may be contributing to UTI risk and CKD.   She reports needing to do double voiding - after urinating she feels the need to void again within a few minutes to empty as well as possible. Reports occasional hesitancy. Denies urinary urgency, frequency, nocturia, dysuria, gross hematuria, straining to void, or sensations of incomplete emptying.  She reports only 1 UTI in the past 12 months.  She denies any UTIs since starting a daily cranberry supplement recently.   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      Cranberry 500 MG CAPS Take by mouth.     levothyroxine (SYNTHROID) 75 MCG tablet Take 1 tablet (75 mcg total) by mouth daily. 90 tablet 1    lisinopril-hydrochlorothiazide (ZESTORETIC) 20-25 MG tablet TAKE 1 TABLET BY MOUTH EVERY DAY 90 tablet 0   Multiple Vitamin (MULTIVITAMIN) tablet Take 1 tablet by mouth daily.     No current facility-administered medications for this visit.    Allergies: Allergies  Allergen Reactions   Penicillins Rash    Did it involve swelling of the face/tongue/throat, SOB, or low BP? Unknown Did it involve sudden or severe rash/hives, skin peeling, or any reaction on the inside of your mouth or nose? Ruta Hinds Did you need to seek medical attention at a hospital or doctor's office? No When did it last happen?  childhood If all above answers are "NO", may proceed with cephalosporin use.    Sulfa Antibiotics Rash    Past Medical History:  Diagnosis Date   Cataract    Cataracts, bilateral    Hyperlipidemia    Hypertension    Incarcerated umbilical hernia 06/21/2019   Thyroid disease    Past Surgical History:  Procedure Laterality Date   ABDOMINAL HYSTERECTOMY     APPENDECTOMY     BLADDER SURGERY     EYE SURGERY     Cataracts   HERNIA REPAIR     UMBILICAL HERNIA REPAIR N/A 06/22/2019   Procedure: HERNIA REPAIR UMBILICAL ADULT;  Surgeon: Franky Macho, MD;  Location: [facility];  Service: General;  Laterality: N/A;   Family History  Problem Relation Age of Onset   Stroke Mother    Heart disease Mother    Heart disease Father    Stroke Father    Bone cancer Father    Cervical cancer  Sister    Aneurysm Brother    Social History   Socioeconomic History   Marital status: Widowed    Spouse name: Not on file   Number of children: 1   Years of education: 14   Highest education level: Not on file  Occupational History   Occupation: Retired  Tobacco Use   Smoking status: Never   Smokeless tobacco: Never  Vaping Use   Vaping status: Never Used  Substance and Sexual Activity   Alcohol use: Never   Drug use: Never   Sexual activity: Not Currently  Other Topics Concern   Not on  file  Social History Narrative   Lives alone - duplex - only 2 stairs to come in   Neighbor is handicap, so she mows his yard and helps him when needed   Very independent - no cane or walker    Daughter lives 5 minutes away   Social Drivers of Health   Financial Resource Strain: Low Risk  (01/11/2024)   Overall Financial Resource Strain (CARDIA)    Difficulty of Paying Living Expenses: Not hard at all  Food Insecurity: No Food Insecurity (01/11/2024)   Hunger Vital Sign    Worried About Running Out of Food in the Last Year: Never true    Ran Out of Food in the Last Year: Never true  Transportation Needs: No Transportation Needs (01/11/2024)   PRAPARE - Administrator, Civil Service (Medical): No    Lack of Transportation (Non-Medical): No  Physical Activity: Sufficiently Active (01/11/2024)   Exercise Vital Sign    Days of Exercise per Week: 5 days    Minutes of Exercise per Session: 30 min  Stress: No Stress Concern Present (01/11/2024)   [employer] of Occupational Health - Occupational Stress Questionnaire    Feeling of Stress : Not at all  Social Connections: Moderately Isolated (01/11/2024)   Social Connection and Isolation Panel [NHANES]    Frequency of Communication with Friends and Family: More than three times a week    Frequency of Social Gatherings with Friends and Family: Three times a week    Attends Religious Services: More than 4 times per year    Active Member of Clubs or Organizations: No    Attends Banker Meetings: Never    Marital Status: Widowed  Intimate Partner Violence: Not At Risk (01/11/2024)   Humiliation, Afraid, Rape, and Kick questionnaire    Fear of Current or Ex-Partner: No    Emotionally Abused: No    Physically Abused: No    Sexually Abused: No    SUBJECTIVE  Review of Systems Constitutional: Patient denies any unintentional weight loss or change in strength lntegumentary: Patient denies any rashes or  pruritus Cardiovascular: Patient denies chest pain or syncope Respiratory: Patient denies shortness of breath Gastrointestinal: Patient denies constipation Musculoskeletal: Patient denies muscle cramps or weakness Neurologic: Patient denies convulsions or seizures Allergic/Immunologic: Patient denies recent allergic reaction(s) Hematologic/Lymphatic: Patient denies bleeding tendencies Endocrine: Patient denies heat/cold intolerance  GU: As per HPI.  OBJECTIVE Vitals:   03/08/24 1341  BP: 125/72  Pulse: (!) 102   There is no height or weight on file to calculate BMI.  Physical Examination Constitutional: No obvious distress; patient is non-toxic appearing  Cardiovascular: No visible lower extremity edema.  Respiratory: The patient does not have audible wheezing/stridor; respirations do not appear labored  Gastrointestinal: Abdomen non-distended Musculoskeletal: Normal ROM of UEs  Skin: No obvious rashes/open sores  Neurologic: CN 2-12 grossly  intact Psychiatric: Answered questions appropriately with normal affect  Hematologic/Lymphatic/Immunologic: No obvious bruises or sites of spontaneous bleeding  PVR = 389 ml. Reported urinating about 3-4 hours prior to arrival but was unable to void in office today. Unable to obtain UA.    ASSESSMENT Incomplete bladder emptying - Plan: Urinalysis, Routine w reflex microscopic, BLADDER SCAN AMB NON-IMAGING, US RENAL  History of UTI  Stage 3 chronic kidney disease, unspecified whether stage 3a or 3b CKD (HCC)  We considered possible etiologies of her intermittent (?) incomplete bladder emptying including but not limited to: neurogenic bladder, tight TVT sling, voiding dysfunction secondary to high-tone pelvic floor dysfunction and/or constipation, etc. Low concern at this time since she has had no bladder discomfort, only 1 UTI in the past 12 months, and no hydronephrosis on RUS done 11/26/2023. Today's PVR is reasonable based on her  reported last void being >3 hours prior. We discussed potential next steps such as observation, clean intermittent catheterization (CIC), indwelling Foley catheter placement, urodynamic testing, cystoscopy, etc. After a detailed discussion regarding potential risks and benefits, we agreed to proceed cautiously with observation and to follow up in late June 2025 with renal US to recheck for hydronephrosis.   For UTI prevention advise to maintain adequate daily hydration and to continue daily cranberry supplement. Handout provided with additional information about UTI prevention. She was advised to contact [facility] [place] if UTI symptoms occur.   Patient verbalized understanding of and agreement with current plan. All questions were answered.  PLAN Advised the following: 1. Maintain adequate daily hydration. 2. Continue daily cranberry supplement.  3. Return in about 3 months (around 05/30/2024) for RUS, UA, PVR, & f/u with Evette Georges NP.  Orders Placed This Encounter  Procedures   US RENAL    Standing Status:   Future    Expected Date:   05/09/2024    Expiration Date:   03/08/2025    Reason for Exam (SYMPTOM  OR DIAGNOSIS REQUIRED):   kidney stone known or suspected    Preferred imaging location?:   [facility]   Urinalysis, Routine w reflex microscopic   BLADDER SCAN AMB NON-IMAGING   It has been explained that the patient is to follow regularly with their PCP in addition to all other providers involved in their care and to follow instructions provided by these respective offices. Patient advised to contact urology clinic if any urologic-pertaining questions, concerns, new symptoms or problems arise in the interim period.  Patient Instructions  UTI prevention / management:  Difference between Urinalysis (urine dipstick test) and Urine culture:  Urinalysis (urine dipstick test): A quick office test used as an indicator to determine whether or not further testing is  necessary (such as a urine culture, urine microscopy, etc.) The urinalysis cannot differentiate a true bacterial UTI or give a definitive diagnosis for the findings.  Urine culture: May be performed based on the findings of a urinalysis to evaluate for UTI. Grows out on a petri dish for 48-72 hours. Provides important information about: whether or not bacterial growth is present and if so: what the predominant bacteria is which antibiotics will work best against that bacteria That information is important so that we can diagnose and treat patients appropriately as there are other conditions which may mimic UTls which must not be missed (such as cancer, interstitial cystitis, stones, etc.). Assists [place] with antibiotic stewardship to minimize patient's risk for developing antibiotic resistance (getting to a point where no antibiotics work anymore).  Options when UTI symptoms occur: 1. Call [facility] [place] and request to speak with triage nurse (phone # [phone], select option 3). In accordance with clinic guidelines the nurse will determine next steps based on patient-reported symptoms, which may include: same-day lab visit to provide urine specimen, recommendation to schedule Urology office visit appointment for further evaluation, recommendation to proceed to ER, etc. 2. Call your Primary Care Provider (PCP) office to request urgent / same-day visit. Be sure to request for urine culture to be ordered and have results faxed to Urology (fax # [phone]).  3. Go to urgent care. Be sure to request for urine culture to be ordered and have results faxed to Urology (fax # [phone]).   For bladder pain/ burning with urination: - Can take over-the-counter Pyridium (phenazopyridine; commonly known under the "AZO" brand) for a few days as needed. Limit use to no more than 3 days consecutively due to risk for methemoglobinemia, liver function issues, and bone health damage with long  term use of Pyridium. - Alternative: Prescription urinary analgesics (such as Uribel, Urogesic blue, Urelle, Uro-MP). Often expensive / poorly covered by insurance unfortunately.  Options / recommendations for UTI prevention: - Topical vaginal estrogen for vaginal atrophy (aka Genitourinary Syndrome of Menopause (GSM)). - Adequate daily fluid intake to flush out the urinary tract. - Go to the bathroom to urinate every 4-6 hours while awake to minimize urinary stasis / bacterial overgrowth in the bladder. - Proanthocyanidin (PAC) supplement 36 mg daily; must be soluble (insoluble form of PAC will be ineffective). Recommended brand: Ellura. This is an over-the-counter supplement (often must be found/ purchased online) supplement derived from cranberries with concentrated active component: Proanthocyanidin (PAC) 36 mg daily. Decreases bacterial adherence to bladder lining.  - D-mannose powder (2 grams daily). This is an over-the-counter supplement which decreases bacterial adherence to bladder lining (it is a sugar that inhibits bacterial adherence to urothelial cells by binding to the pili of enteric bacteria). Take as per manufacturer recommendation. Can be used as an alternative or in addition to the concentrated cranberry supplement.  - Vitamin C supplement to acidify urine to minimize bacterial growth.  - Probiotic to maintain healthy vaginal microbiome to suppress bacteria at urethral opening. Brand recommendations: Darrold Junker (includes probiotic & D-mannose ), Feminine Balance (highest concentration of lactobacillus) or Hyperbiotic Pro 15.  Note for patients with diabetes:  - Be aware that D-mannose contains sugar.  Note for patients with interstitial cystitis (IC):  - Patients with IC should typically avoid cranberry/ PAC supplements and Vitamin C supplements due to their acidity, which may exacerbate IC-related bladder pain. - Symptoms of true bacterial UTI can overlap / mimic symptoms of an IC  flare up. Antibiotic use is NOT indicated for IC flare ups. Urine culture needed prior to antibiotic treatment for IC patients. The goal is to minimize your risk for developing antibiotic-resistant bacteria.    Vaginal atrophy I Genitourinary syndrome of menopause (GSM):  What it is: Changes in the vaginal environment (including the vulva and urethra) including: Thinning of the epithelium (skin/ mucosa surface) Can contribute to urinary urgency and frequency Can contribute to dryness, itching, irritation of the vulvar and vaginal tissue Can contribute to pain with intercourse Can contribute to physical changes of the labia, vulva, and vagina such as: Narrowing of the vaginal opening Decreased vaginal length Loss of labial architecture Labial adhesions Pale color of vulvovaginal tissue  Loss of pubic hair Allows bacteria to become adherent  Results in increased risk  for urinary tract infection (UTI) due to bacterial overgrowth and migration up the urethra into the bladder Change in vaginal pH (acid/ base balance) Allows for alteration / disruption of the normal bacterial flora / microbiome Results in increased risk for urinary tract infection (UTI) due to bacterial overgrowth  Treatment options: Over-the-counter lubricants (see list below). Prescription vaginal estrogen replacement. Options: Topical vaginal estrogen cream Estrace, Premarin, or compounded estradiol cream/ gel We advise: Discard plastic applicator as that tends to use more medication than you need, which is not harmful but wastes / uses up the medication. Also the plastic applicator may cause discomfort. Insert blueberry size amount of medication via the tip of your finger inside vagina nightly for 1 week then 2-3 times per week (long term). Estring vaginal ring Exchanged every 3 months (either at home or in office by provider) Vagifem vaginal tablet Inserted nightly for 2 weeks then twice a week (long term)  lntrarosa vaginal suppository Vaginal DHEA: converts to estrogen in vaginal tissue without systemic effect Inserted nightly (long term) Vaginal laser therapy (Mona Lisa touch) Performed in 3 treatments each 6 weeks apart (available in our [place] office). Can feel like a sunburn for 3-4 days after each treatment until new skin heals in. Usually not covered by insurance. Estimated cost is $1500 for all 3 sessions.  FYI regarding prescription vaginal estrogen treatment options: All topical vaginal estrogen replacement options are equivalent in terms of efficacy. Topical vaginal estrogen replacement will take about 3 months to be effective. OK to have sex with any of the topical vaginal estrogen replacement options. Topical vaginal estrogen replacement may sting/burn initially due to severe dryness, which will improve with ongoing treatment. There have been studies that evaluate use of low-dose intravaginal estrogen that show minimal systemic absorption which is negligible after 3 weeks. There have been no studies indicating increased risk of contributing to cancer development or recurrence.  Topical vaginal estrogen cream safe to use with breast cancer history [email or web address]  Topical vaginal estrogen cream safe to use with blood clot history [email or web address]   Lubricants and Moisturizers for Treating Genitourinary Syndrome of Menopause and Vulvovaginal Atrophy Treatment Comments I Available Products   Lubricants   Water-based Ingredients: Deionized water, glycerin, propylene glycol; latex safe; rare irritation; dry out with extended sexual activity Astroglide, Good Clean Love, K-Y Jelly, Natural, Organic, Pink, Sliquid, Sylk, Yes    Oil Based  Ingredients: avocado, olive, peanut, corn; latex safe; can be used with silicone products; staining; safe (unless peanut allergy); non-irritating Coconut oil, vegetable oil, vitamin E oil  Silicone-Based Ingredients: Silicone polymers; staining; typically nonirritating, long lasting; waterproof; should not be used with silicone dilators, sexual toys, or gynecologic products Astroglide X, Oceanus Ultra Pure, Pink Silicone, Pjur Eros, Replens Silky Smooth, Silicone Premium JO, SKYN, Uberlube, [employer] Based Minimize harm to sperm motility; designed Astroglide TTC, Conceive Plus, Pre for couples trying to conceive Seed, Yes Baby  Fertility Friendly Minimize harm to sperm motility; designed Astroglide, TTC, Conceive Plus, Pre for couples trying to conceive Seed, Yes Baby  Vaginal Moisturizers   Vaginal Moisturizers For maintenance use 1 to 3 times weekly; can benefit women with dryness, chafing with AOL, and recurrent vaginal infections irrespective of sexual activity timing Balance Active Menopause Vaginal Moisturizing Lubricant, Canesintima Intimate Moisturizer, Replens, Rephresh, Sylk Natural Intimate Moisturizer, Yes Vaginal Moisturizer  Hybrids Properties of both water and silicone-based products (combination of a vaginal lubricant and moisturizer); Non-irritating; good option for women with allergies and sensitivities [place], [place]  Suppositories Hyaluronic acid to retain moisture Revaree  Vulvar Soothing Creams/Oils    Medicated CreamsP ain and burn relief; Ingredients: 4% Lidocaine, Aloe Vera gel Releveum ([place])  Non-Medicated Creams For anti-itch and moisture/maintenance; Ingredients: Coconut oil, Avocado oil, Shea Butter, Olive oil, Vitamin E Vajuvenate, Vmagic  Oils !For moisture/maintenance !Coconut oil, Vitamin E oil, Emu oil     Electronically signed by:  Donnita Falls, MSN, FNP-C, CUNP 03/08/2024 2:43 PM

## 2024-03-08 NOTE — Patient Instructions (Signed)
 UTI prevention / management:  Difference between Urinalysis (urine dipstick test) and Urine culture:  Urinalysis (urine dipstick test): A quick office test used as an indicator to determine whether or not further testing is necessary (such as a urine culture, urine microscopy, etc.) The urinalysis cannot differentiate a true bacterial UTI or give a definitive diagnosis for the findings.  Urine culture: May be performed based on the findings of a urinalysis to evaluate for UTI. Grows out on a petri dish for 48-72 hours. Provides important information about: whether or not bacterial growth is present and if so: what the predominant bacteria is which antibiotics will work best against that bacteria That information is important so that we can diagnose and treat patients appropriately as there are other conditions which may mimic UTls which must not be missed (such as cancer, interstitial cystitis, stones, etc.). Assists [place] with antibiotic stewardship to minimize patient's risk for developing antibiotic resistance (getting to a point where no antibiotics work anymore).  Options when UTI symptoms occur: 1. Call [facility] [place] and request to speak with triage nurse (phone # [phone], select option 3). In accordance with clinic guidelines the nurse will determine next steps based on patient-reported symptoms, which may include: same-day lab visit to provide urine specimen, recommendation to schedule Urology office visit appointment for further evaluation, recommendation to proceed to ER, etc. 2. Call your Primary Care Provider (PCP) office to request urgent / same-day visit. Be sure to request for urine culture to be ordered and have results faxed to Urology (fax # [phone]).  3. Go to urgent care. Be sure to request for urine culture to be ordered and have results faxed to Urology (fax # [phone]).   For bladder pain/ burning with urination: - Can take over-the-counter  Pyridium (phenazopyridine; commonly known under the "AZO" brand) for a few days as needed. Limit use to no more than 3 days consecutively due to risk for methemoglobinemia, liver function issues, and bone health damage with long term use of Pyridium. - Alternative: Prescription urinary analgesics (such as Uribel, Urogesic blue, Urelle, Uro-MP). Often expensive / poorly covered by insurance unfortunately.  Options / recommendations for UTI prevention: - Topical vaginal estrogen for vaginal atrophy (aka Genitourinary Syndrome of Menopause (GSM)). - Adequate daily fluid intake to flush out the urinary tract. - Go to the bathroom to urinate every 4-6 hours while awake to minimize urinary stasis / bacterial overgrowth in the bladder. - Proanthocyanidin (PAC) supplement 36 mg daily; must be soluble (insoluble form of PAC will be ineffective). Recommended brand: Ellura. This is an over-the-counter supplement (often must be found/ purchased online) supplement derived from cranberries with concentrated active component: Proanthocyanidin (PAC) 36 mg daily. Decreases bacterial adherence to bladder lining.  - D-mannose powder (2 grams daily). This is an over-the-counter supplement which decreases bacterial adherence to bladder lining (it is a sugar that inhibits bacterial adherence to urothelial cells by binding to the pili of enteric bacteria). Take as per manufacturer recommendation. Can be used as an alternative or in addition to the concentrated cranberry supplement.  - Vitamin C supplement to acidify urine to minimize bacterial growth.  - Probiotic to maintain healthy vaginal microbiome to suppress bacteria at urethral opening. Brand recommendations: Darrold Junker (includes probiotic & D-mannose ), Feminine Balance (highest concentration of lactobacillus) or Hyperbiotic Pro 15.  Note for patients with diabetes:  - Be aware that D-mannose contains sugar.  Note for patients with interstitial cystitis (IC):  -  Patients with IC should  typically avoid cranberry/ PAC supplements and Vitamin C supplements due to their acidity, which may exacerbate IC-related bladder pain. - Symptoms of true bacterial UTI can overlap / mimic symptoms of an IC flare up. Antibiotic use is NOT indicated for IC flare ups. Urine culture needed prior to antibiotic treatment for IC patients. The goal is to minimize your risk for developing antibiotic-resistant bacteria.    Vaginal atrophy I Genitourinary syndrome of menopause (GSM):  What it is: Changes in the vaginal environment (including the vulva and urethra) including: Thinning of the epithelium (skin/ mucosa surface) Can contribute to urinary urgency and frequency Can contribute to dryness, itching, irritation of the vulvar and vaginal tissue Can contribute to pain with intercourse Can contribute to physical changes of the labia, vulva, and vagina such as: Narrowing of the vaginal opening Decreased vaginal length Loss of labial architecture Labial adhesions Pale color of vulvovaginal tissue Loss of pubic hair Allows bacteria to become adherent  Results in increased risk for urinary tract infection (UTI) due to bacterial overgrowth and migration up the urethra into the bladder Change in vaginal pH (acid/ base balance) Allows for alteration / disruption of the normal bacterial flora / microbiome Results in increased risk for urinary tract infection (UTI) due to bacterial overgrowth  Treatment options: Over-the-counter lubricants (see list below). Prescription vaginal estrogen replacement. Options: Topical vaginal estrogen cream Estrace, Premarin, or compounded estradiol cream/ gel We advise: Discard plastic applicator as that tends to use more medication than you need, which is not harmful but wastes / uses up the medication. Also the plastic applicator may cause discomfort. Insert blueberry size amount of medication via the tip of your finger inside vagina  nightly for 1 week then 2-3 times per week (long term). Estring vaginal ring Exchanged every 3 months (either at home or in office by provider) Vagifem vaginal tablet Inserted nightly for 2 weeks then twice a week (long term) lntrarosa vaginal suppository Vaginal DHEA: converts to estrogen in vaginal tissue without systemic effect Inserted nightly (long term) Vaginal laser therapy (Mona Lisa touch) Performed in 3 treatments each 6 weeks apart (available in our [place] office). Can feel like a sunburn for 3-4 days after each treatment until new skin heals in. Usually not covered by insurance. Estimated cost is $1500 for all 3 sessions.  FYI regarding prescription vaginal estrogen treatment options: All topical vaginal estrogen replacement options are equivalent in terms of efficacy. Topical vaginal estrogen replacement will take about 3 months to be effective. OK to have sex with any of the topical vaginal estrogen replacement options. Topical vaginal estrogen replacement may sting/burn initially due to severe dryness, which will improve with ongoing treatment. There have been studies that evaluate use of low-dose intravaginal estrogen that show minimal systemic absorption which is negligible after 3 weeks. There have been no studies indicating increased risk of contributing to cancer development or recurrence.  Topical vaginal estrogen cream safe to use with breast cancer history [email or web address]  Topical vaginal estrogen cream safe to use with blood clot history [email or web address]   Lubricants and Moisturizers for Treating Genitourinary Syndrome of Menopause and Vulvovaginal Atrophy Treatment Comments I Available Products   Lubricants    Water-based Ingredients: Deionized water, glycerin, propylene glycol; latex safe; rare irritation; dry out with extended sexual activity Astroglide, Good Clean Love, K-Y Jelly, Natural, Organic, Pink, Sliquid, Sylk, Yes    Oil Based Ingredients: avocado, olive, peanut, corn; latex safe; can be used with silicone products; staining; safe (unless peanut allergy); non-irritating Coconut  oil, vegetable oil, vitamin E oil  Silicone-Based Ingredients: Silicone polymers; staining; typically nonirritating, long lasting; waterproof; should not be used with silicone dilators, sexual toys, or gynecologic products Astroglide X, Oceanus Ultra Pure, Pink Silicone, Pjur Eros, Replens Silky Smooth, Silicone Premium JO, SKYN, Uberlube, [employer] Based Minimize harm to sperm motility; designed Astroglide TTC, Conceive Plus, Pre for couples trying to conceive Seed, Yes Baby  Fertility Friendly Minimize harm to sperm motility; designed Astroglide, TTC, Conceive Plus, Pre for couples trying to conceive Seed, Yes Baby  Vaginal Moisturizers   Vaginal Moisturizers For maintenance use 1 to 3 times weekly; can benefit women with dryness, chafing with AOL, and recurrent vaginal infections irrespective of sexual activity timing Balance Active Menopause Vaginal Moisturizing Lubricant, Canesintima Intimate Moisturizer, Replens, Rephresh, Sylk Natural Intimate Moisturizer, Yes Vaginal Moisturizer  Hybrids Properties of both water and silicone-based products (combination of a vaginal lubricant and moisturizer); Non-irritating; good option for women with allergies and sensitivities Lubrigyn, Luvena  Suppositories Hyaluronic acid to retain moisture Revaree  Vulvar Soothing Creams/Oils    Medicated CreamsP ain and burn relief; Ingredients: 4% Lidocaine, Aloe Vera gel Releveum (Desert Wheaton)  Non-Medicated Creams For anti-itch and moisture/maintenance; Ingredients: Coconut oil, Avocado oil, Shea Butter,  Olive oil, Vitamin E Vajuvenate, Vmagic  Oils !For moisture/maintenance !Coconut oil, Vitamin E oil, Emu oil

## 2024-03-11 ENCOUNTER — Encounter: Payer: Self-pay | Admitting: Family Medicine

## 2024-03-11 ENCOUNTER — Ambulatory Visit: Payer: Medicare Other | Admitting: Family Medicine

## 2024-03-11 VITALS — BP 130/70 | HR 80 | Temp 97.6°F | Ht 66.0 in | Wt 169.6 lb

## 2024-03-11 DIAGNOSIS — E039 Hypothyroidism, unspecified: Secondary | ICD-10-CM

## 2024-03-11 DIAGNOSIS — E782 Mixed hyperlipidemia: Secondary | ICD-10-CM | POA: Diagnosis not present

## 2024-03-11 DIAGNOSIS — N1831 Chronic kidney disease, stage 3a: Secondary | ICD-10-CM

## 2024-03-11 DIAGNOSIS — I10 Essential (primary) hypertension: Secondary | ICD-10-CM | POA: Diagnosis not present

## 2024-03-11 DIAGNOSIS — E7841 Elevated Lipoprotein(a): Secondary | ICD-10-CM

## 2024-03-11 MED ORDER — LEVOTHYROXINE SODIUM 75 MCG PO TABS: 75.0000 ug | ORAL_TABLET | Freq: Every day | ORAL | 1 refills | Status: DC

## 2024-03-11 MED ORDER — LISINOPRIL-HYDROCHLOROTHIAZIDE 20-25 MG PO TABS: 1.0000 | ORAL_TABLET | Freq: Every day | ORAL | 1 refills | Status: DC

## 2024-03-11 MED ORDER — ATORVASTATIN CALCIUM 10 MG PO TABS: 10.0000 mg | ORAL_TABLET | Freq: Every day | ORAL | 2 refills | Status: AC

## 2024-03-11 NOTE — Patient Instructions (Signed)
 Goal BP:  For patients younger than 60: Goal BP < 140/90. For patients 60 and older: Goal BP < 150/90. For patients with diabetes: Goal BP < 140/90.  Take your medications faithfully as prescribed. Maintain a healthy weight. Get at least 150 minutes of aerobic exercise per week. Minimize salt intake, less than 2000 mg per day. Minimize alcohol intake.  DASH Eating Plan DASH stands for "Dietary Approaches to Stop Hypertension." The DASH eating plan is a healthy eating plan that has been shown to reduce high blood pressure (hypertension). Additional health benefits may include reducing the risk of type 2 diabetes mellitus, heart disease, and stroke. The DASH eating plan may also help with weight loss.  WHAT DO I NEED TO KNOW ABOUT THE DASH EATING PLAN? For the DASH eating plan, you will follow these general guidelines: Choose foods with a percent daily value for sodium of less than 5% (as listed on the food label). Use salt-free seasonings or herbs instead of table salt or sea salt. Check with your health care provider or pharmacist before using salt substitutes. Eat lower-sodium products, often labeled as "lower sodium" or "no salt added." Eat fresh foods. Eat more vegetables, fruits, and low-fat dairy products. Choose whole grains. Look for the word "whole" as the first word in the ingredient list. Choose fish and skinless chicken or [place] more often than red meat. Limit fish, poultry, and meat to 6 oz (170 g) each day. Limit sweets, desserts, sugars, and sugary drinks. Choose heart-healthy fats. Limit cheese to 1 oz (28 g) per day. Eat more home-cooked food and less restaurant, buffet, and fast food. Limit fried foods. Cook foods using methods other than frying. Limit canned vegetables. If you do use them, rinse them well to decrease the sodium. When eating at a restaurant, ask that your food be prepared with less salt, or no salt if possible.  WHAT FOODS CAN I EAT? Seek help from  a dietitian for individual calorie needs.  Grains Whole grain or whole wheat bread. Brown rice. Whole grain or whole wheat pasta. Quinoa, bulgur, and whole grain cereals. Low-sodium cereals. Corn or whole wheat flour tortillas. Whole grain cornbread. Whole grain crackers. Low-sodium crackers.  Vegetables Fresh or frozen vegetables (raw, steamed, roasted, or grilled). Low-sodium or reduced-sodium tomato and vegetable juices. Low-sodium or reduced-sodium tomato sauce and paste. Low-sodium or reduced-sodium canned vegetables.   Fruits All fresh, canned (in natural juice), or frozen fruits.  Meat and Other Protein Products Ground beef (85% or leaner), grass-fed beef, or beef trimmed of fat. Skinless chicken or [place]. Ground chicken or [place]. Pork trimmed of fat. All fish and seafood. Eggs. Dried beans, peas, or lentils. Unsalted nuts and seeds. Unsalted canned beans.  Dairy Low-fat dairy products, such as skim or 1% milk, 2% or reduced-fat cheeses, low-fat ricotta or cottage cheese, or plain low-fat yogurt. Low-sodium or reduced-sodium cheeses.  Fats and Oils Tub margarines without trans fats. Light or reduced-fat mayonnaise and salad dressings (reduced sodium). Avocado. Safflower, olive, or canola oils. Natural peanut or almond butter.  Other Unsalted popcorn and pretzels. The items listed above may not be a complete list of recommended foods or beverages. Contact your dietitian for more options.  WHAT FOODS ARE NOT RECOMMENDED?  Grains White bread. White pasta. White rice. Refined cornbread. Bagels and croissants. Crackers that contain trans fat.  Vegetables Creamed or fried vegetables. Vegetables in a cheese sauce. Regular canned vegetables. Regular canned tomato sauce and paste. Regular tomato and vegetable juices.  Fruits Dried fruits. Canned fruit in light or heavy syrup. Fruit juice.  Meat and Other Protein Products Fatty cuts of meat. Ribs, chicken wings, bacon, sausage,  bologna, salami, chitterlings, fatback, hot dogs, bratwurst, and packaged luncheon meats. Salted nuts and seeds. Canned beans with salt.  Dairy Whole or 2% milk, cream, half-and-half, and cream cheese. Whole-fat or sweetened yogurt. Full-fat cheeses or blue cheese. Nondairy creamers and whipped toppings. Processed cheese, cheese spreads, or cheese curds.  Condiments Onion and garlic salt, seasoned salt, table salt, and sea salt. Canned and packaged gravies. Worcestershire sauce. Tartar sauce. Barbecue sauce. Teriyaki sauce. Soy sauce, including reduced sodium. Steak sauce. Fish sauce. Oyster sauce. Cocktail sauce. Horseradish. Ketchup and mustard. Meat flavorings and tenderizers. Bouillon cubes. Hot sauce. Tabasco sauce. Marinades. Taco seasonings. Relishes.  Fats and Oils Butter, stick margarine, lard, shortening, ghee, and bacon fat. Coconut, palm kernel, or palm oils. Regular salad dressings.  Other Pickles and olives. Salted popcorn and pretzels.  The items listed above may not be a complete list of foods and beverages to avoid. Contact your dietitian for more information.  WHERE CAN I FIND MORE INFORMATION? National Heart, Lung, and Blood Institute: [email or web address] Document Released: 11/06/2011 Document Revised: 04/03/2014 Document Reviewed: 09/21/2013 [facility] Patient Information 2015 [place], [place]. This information is not intended to replace advice given to you by your health care provider. Make sure you discuss any questions you have with your health care provider.   I think that you would greatly benefit from seeing a nutritionist.  If you are interested, please call Dr. Gerilyn Pilgrim at [phone] to schedule an appointment.

## 2024-03-11 NOTE — Progress Notes (Signed)
 Subjective:  Patient ID: Hannah [name], female    DOB: [birth year], 83 y.o.   MRN: [id number]  Patient Care Team: Sonny Masters, FNP as PCP - General (Family Medicine) Dr [facility], OD (Optometry)   Chief Complaint:  Medical Management of Chronic Issues (6 month follow up )   HPI: Hannah [name] is a 83 y.o. female presenting on 03/11/2024 for Medical Management of Chronic Issues (6 month follow up )   Discussed the use of AI scribe software for clinical note transcription with the patient, who gave verbal consent to proceed.  History of Present Illness   Hannah [name] is an 83 year old female with hypertension and chronic urinary retention who presents for follow-up.  She has hypertension with home blood pressure readings typically in the 130s to 140s over 70s. This morning, her blood pressure was elevated, and she experiences headaches when her blood pressure is high. She has been monitoring her blood pressure at home after purchasing a monitor. No leg swelling, changes in hearing or vision.  She has a history of chronic urinary retention and recurrent urinary tract infections. Despite consultations with a nephrologist and a urologist, the cause of her incomplete bladder emptying remains unidentified. Follow-up with both specialists is scheduled in six months.  She had a recent urinary tract infection but has since recovered. She continues to take cranberry tablets.  She is currently taking her thyroid medication without any issues and denies symptoms such as fatigue, sleep disturbances, changes in bowel habits, or vision changes.  In terms of her diet, she has reduced her intake of fatty foods, bacon, and sausage, and has cut back on salt. She reports not having an appetite and not eating regular meals.          Relevant past medical, surgical, family, and social history reviewed and updated as indicated.  Allergies and medications reviewed and updated.  Data reviewed: Chart in Epic.   Past Medical History:  Diagnosis Date   Cataract    Cataracts, bilateral    Hyperlipidemia    Hypertension    Incarcerated umbilical hernia 06/21/2019   Thyroid disease     Past Surgical History:  Procedure Laterality Date   ABDOMINAL HYSTERECTOMY     APPENDECTOMY     BLADDER SURGERY     EYE SURGERY     Cataracts   HERNIA REPAIR     UMBILICAL HERNIA REPAIR N/A 06/22/2019   Procedure: HERNIA REPAIR UMBILICAL ADULT;  Surgeon: Franky Macho, MD;  Location: [facility];  Service: General;  Laterality: N/A;    Social History   Socioeconomic History   Marital status: Widowed    Spouse name: Not on file   Number of children: 1   Years of education: 12   Highest education level: Not on file  Occupational History   Occupation: Retired  Tobacco Use   Smoking status: Never   Smokeless tobacco: Never  Vaping Use   Vaping status: Never Used  Substance and Sexual Activity   Alcohol use: Never   Drug use: Never   Sexual activity: Not Currently  Other Topics Concern   Not on file  Social History Narrative   Lives alone - duplex - only 2 stairs to come in   Neighbor is handicap, so she mows his yard and helps him when needed   Very independent - no cane or walker    Daughter lives 5 minutes away   Social Drivers of Health  Financial Resource Strain: Low Risk  (01/11/2024)   Overall Financial Resource Strain (CARDIA)    Difficulty of Paying Living Expenses: Not hard at all  Food Insecurity: No Food Insecurity (01/11/2024)   Hunger Vital Sign    Worried About Running Out of Food in the Last Year: Never true    Ran Out of Food in the Last Year: Never true  Transportation Needs: No Transportation Needs (01/11/2024)   PRAPARE - Administrator, Civil Service (Medical): No    Lack of Transportation (Non-Medical): No  Physical Activity: Sufficiently Active (01/11/2024)   Exercise Vital Sign    Days of Exercise per Week: 5 days    Minutes of  Exercise per Session: 30 min  Stress: No Stress Concern Present (01/11/2024)   [name] [employer]ational [employer] - Occupational Stress Questionnaire    Feeling of Stress : Not at all  Social Connections: Moderately Isolated (01/11/2024)   Social Connection and Isolation Panel [NHANES]    Frequency of Communication with Friends and Family: More than three times a week    Frequency of Social Gatherings with Friends and Family: Three times a week    Attends Religious Services: More than 4 times per year    Active Member of Clubs or Organizations: No    Attends Banker Meetings: Never    Marital Status: Widowed  Intimate Partner Violence: Not At Risk (01/11/2024)   Humiliation, Afraid, Rape, and Kick questionnaire    Fear of Current or Ex-Partner: No    Emotionally Abused: No    Physically Abused: No    Sexually Abused: No    Outpatient Encounter Medications as of 03/11/2024  Medication Sig   Calcium Carb-Cholecalciferol 600-400 MG-UNIT TABS Take 1 tablet by mouth daily.    Cranberry 500 MG CAPS Take by mouth.   Multiple Vitamin (MULTIVITAMIN) tablet Take 1 tablet by mouth daily.   [DISCONTINUED] atorvastatin (LIPITOR) 10 MG tablet TAKE 1 TABLET BY MOUTH EVERY DAY   [DISCONTINUED] levothyroxine (SYNTHROID) 75 MCG tablet Take 1 tablet (75 mcg total) by mouth daily.   [DISCONTINUED] lisinopril-hydrochlorothiazide (ZESTORETIC) 20-25 MG tablet TAKE 1 TABLET BY MOUTH EVERY DAY   atorvastatin (LIPITOR) 10 MG tablet Take 1 tablet (10 mg total) by mouth at bedtime.   levothyroxine (SYNTHROID) 75 MCG tablet Take 1 tablet (75 mcg total) by mouth daily.   lisinopril-hydrochlorothiazide (ZESTORETIC) 20-25 MG tablet Take 1 tablet by mouth daily.   No facility-administered encounter medications on file as of 03/11/2024.    Allergies  Allergen Reactions   Penicillins Rash    Did it involve swelling of the face/tongue/throat, SOB, or low BP? Unknown Did it involve sudden or severe  rash/hives, skin peeling, or any reaction on the inside of your mouth or nose? Ruta Hinds Did you need to seek medical attention at a hospital or doctor's office? No When did it last happen?  childhood If all above answers are "NO", may proceed with cephalosporin use.    Sulfa Antibiotics Rash    Pertinent ROS per HPI, otherwise unremarkable      Objective:  BP 130/70   Pulse 80   Temp 97.6 F (36.4 C)   Ht 5\' 6"  (1.676 m)   Wt 169 lb 9.6 oz (76.9 kg)   SpO2 97%   BMI 27.37 kg/m    Wt Readings from Last 3 Encounters:  03/11/24 169 lb 9.6 oz (76.9 kg)  02/25/24 170 lb (77.1 kg)  01/11/24 168 lb (76.2 kg)  Physical Exam Vitals and nursing note reviewed.  Constitutional:      General: She is not in acute distress.    Appearance: Normal appearance. She is well-developed, well-groomed and overweight. She is not ill-appearing, toxic-appearing or diaphoretic.  HENT:     Head: Normocephalic and atraumatic.     Nose: Nose normal.  Eyes:     Conjunctiva/sclera: Conjunctivae normal.     Pupils: Pupils are equal, round, and reactive to light.     Comments: Left eye exotropia    Cardiovascular:     Rate and Rhythm: Normal rate and regular rhythm.     Heart sounds: Normal heart sounds.  Pulmonary:     Effort: Pulmonary effort is normal.     Breath sounds: Normal breath sounds.  Musculoskeletal:     Cervical back: Neck supple.     Right lower leg: No edema.     Left lower leg: No edema.     Comments: Kyphosis   Skin:    General: Skin is warm and dry.     Capillary Refill: Capillary refill takes less than 2 seconds.  Neurological:     General: No focal deficit present.     Mental Status: She is alert and oriented to person, place, and time.     Comments: Stutter at times - chronic  Psychiatric:        Mood and Affect: Mood normal.        Behavior: Behavior normal.        Thought Content: Thought content normal.        Judgment: Judgment normal.     Results for  orders placed or performed in visit on 03/08/24  BLADDER SCAN AMB NON-IMAGING   Collection Time: 03/08/24  1:48 PM  Result Value Ref Range   Scan Result 389        Pertinent labs & imaging results that were available during my care of the patient were reviewed by me and considered in my medical decision making.  Assessment & Plan:  Ahilyn was seen today for medical management of chronic issues.  Diagnoses and all orders for this visit:  Stage 3a chronic kidney disease (HCC) -     CMP14+EGFR -     CBC with Differential/Platelet  Acquired hypothyroidism -     Lipid panel -     levothyroxine (SYNTHROID) 75 MCG tablet; Take 1 tablet (75 mcg total) by mouth daily. -     Thyroid Panel With TSH  Essential hypertension -     CMP14+EGFR -     lisinopril-hydrochlorothiazide (ZESTORETIC) 20-25 MG tablet; Take 1 tablet by mouth daily.  Mixed hyperlipidemia -     CMP14+EGFR -     Lipid panel -     atorvastatin (LIPITOR) 10 MG tablet; Take 1 tablet (10 mg total) by mouth at bedtime.  Elevated lipoprotein(a) -     Lipid panel -     atorvastatin (LIPITOR) 10 MG tablet; Take 1 tablet (10 mg total) by mouth at bedtime.     Assessment and Plan    Urinary Retention Chronic urinary retention with incomplete bladder emptying leading to recurrent urinary tract infections. Evaluated by nephrology and urology with no definitive cause identified. Follow-up with nephrology and urology scheduled in six months. - Continue cranberry tablets for urinary health - Follow-up with nephrology and urology in six months  Hypertension Blood pressure readings at home range from 130s/70s to 140s/70s. Blood pressure slightly elevated today. Experiences headaches when blood pressure is  elevated. No leg swelling or chest pain reported. - Monitor blood pressure at home regularly - Report if blood pressure consistently exceeds 150/90 mmHg - Follow-up in 4-6 months to reassess blood pressure  management  Hyperlipidemia On cholesterol medication but has been taking it in the morning instead of at night. Advised to take medication at night for better efficacy. - Instruct to take cholesterol medication at night - Refill cholesterol medication  Hypothyroidism Taking thyroid medication with no reported issues. No symptoms of fatigue, sleep disturbances, or changes in bowel habits, hair, or vision. - Order lab work to check thyroid function  General Health Maintenance Has made dietary changes, reducing intake of fatty foods, bacon, sausage, and salt. - Encourage continued dietary modifications          Continue all other maintenance medications.  Follow up plan: Return for 4-6 months for HTN.   Continue healthy lifestyle choices, including diet (rich in fruits, vegetables, and lean proteins, and low in salt and simple carbohydrates) and exercise (at least 30 minutes of moderate physical activity daily).  Educational handout given for DASH diet, HTN  The above assessment and management plan was discussed with the patient. The patient verbalized understanding of and has agreed to the management plan. Patient is aware to call the clinic if they develop any new symptoms or if symptoms persist or worsen. Patient is aware when to return to the clinic for a follow-up visit. Patient educated on when it is appropriate to go to the emergency department.   Kari Baars, FNP-C [facility] [phone]

## 2024-03-12 LAB — CMP14+EGFR
ALT: 14 IU/L (ref 0–32)
AST: 23 IU/L (ref 0–40)
Albumin: 4.2 g/dL (ref 3.7–4.7)
Alkaline Phosphatase: 82 IU/L (ref 44–121)
BUN/Creatinine Ratio: 13 (ref 12–28)
BUN: 13 mg/dL (ref 8–27)
Bilirubin Total: 0.4 mg/dL (ref 0.0–1.2)
CO2: 23 mmol/L (ref 20–29)
Calcium: 9.4 mg/dL (ref 8.7–10.3)
Chloride: 101 mmol/L (ref 96–106)
Creatinine, Ser: 1.01 mg/dL — ABNORMAL HIGH (ref 0.57–1.00)
Globulin, Total: 3.1 g/dL (ref 1.5–4.5)
Glucose: 99 mg/dL (ref 70–99)
Potassium: 3.7 mmol/L (ref 3.5–5.2)
Sodium: 142 mmol/L (ref 134–144)
Total Protein: 7.3 g/dL (ref 6.0–8.5)
eGFR: 55 mL/min/{1.73_m2} — ABNORMAL LOW (ref >59)

## 2024-03-12 LAB — LIPID PANEL
Chol/HDL Ratio: 3.2 ratio (ref 0.0–4.4)
Cholesterol, Total: 142 mg/dL (ref 100–199)
HDL: 44 mg/dL (ref >39)
LDL Chol Calc (NIH): 71 mg/dL (ref 0–99)
Triglycerides: 156 mg/dL — ABNORMAL HIGH (ref 0–149)
VLDL Cholesterol Cal: 27 mg/dL (ref 5–40)

## 2024-03-12 LAB — CBC WITH DIFFERENTIAL/PLATELET
Basophils Absolute: 0 10*3/uL (ref 0.0–0.2)
Basos: 0 %
EOS (ABSOLUTE): 0.1 10*3/uL (ref 0.0–0.4)
Eos: 1 %
Hematocrit: 41.4 % (ref 34.0–46.6)
Hemoglobin: 13.5 g/dL (ref 11.1–15.9)
Immature Grans (Abs): 0 10*3/uL (ref 0.0–0.1)
Immature Granulocytes: 0 %
Lymphocytes Absolute: 0.9 10*3/uL (ref 0.7–3.1)
Lymphs: 17 %
MCH: 29.2 pg (ref 26.6–33.0)
MCHC: 32.6 g/dL (ref 31.5–35.7)
MCV: 89 fL (ref 79–97)
Monocytes Absolute: 0.3 10*3/uL (ref 0.1–0.9)
Monocytes: 5 %
Neutrophils Absolute: 4.1 10*3/uL (ref 1.4–7.0)
Neutrophils: 77 %
Platelets: 140 10*3/uL — ABNORMAL LOW (ref 150–450)
RBC: 4.63 x10E6/uL (ref 3.77–5.28)
RDW: 13.8 % (ref 11.7–15.4)
WBC: 5.4 10*3/uL (ref 3.4–10.8)

## 2024-03-12 LAB — THYROID PANEL WITH TSH
Free Thyroxine Index: 2.8 (ref 1.2–4.9)
T3 Uptake Ratio: 32 % (ref 24–39)
T4, Total: 8.6 ug/dL (ref 4.5–12.0)
TSH: 3.65 u[IU]/mL (ref 0.450–4.500)

## 2024-03-25 ENCOUNTER — Other Ambulatory Visit: Payer: Self-pay | Admitting: *Deleted

## 2024-05-09 ENCOUNTER — Ambulatory Visit (HOSPITAL_COMMUNITY)

## 2024-05-16 ENCOUNTER — Ambulatory Visit (HOSPITAL_COMMUNITY)
Admission: RE | Admit: 2024-05-16 | Discharge: 2024-05-16 | Disposition: A | Discharge status: Home / Self Care | Source: Ambulatory Visit | Attending: Urology | Admitting: Urology

## 2024-05-16 DIAGNOSIS — R93421 Abnormal radiologic findings on diagnostic imaging of right kidney: Secondary | ICD-10-CM | POA: Diagnosis not present

## 2024-05-16 DIAGNOSIS — R339 Retention of urine, unspecified: Secondary | ICD-10-CM | POA: Insufficient documentation

## 2024-05-16 DIAGNOSIS — N2889 Other specified disorders of kidney and ureter: Secondary | ICD-10-CM | POA: Insufficient documentation

## 2024-05-16 DIAGNOSIS — Z9889 Other specified postprocedural states: Secondary | ICD-10-CM | POA: Insufficient documentation

## 2024-05-16 DIAGNOSIS — R93422 Abnormal radiologic findings on diagnostic imaging of left kidney: Secondary | ICD-10-CM | POA: Diagnosis not present

## 2024-05-18 ENCOUNTER — Ambulatory Visit: Payer: Self-pay | Admitting: Urology

## 2024-06-13 NOTE — Progress Notes (Unsigned)
 Name: Hannah [name] DOB: 03/25/1941 MRN: [id number]  History of Present Illness: Hannah [name] is a 83 y.o. female who presents today for follow up visit at [facility].  Relevant History includes: 1. CKD stage 3. Followed by Nephrology (Dr. Rachele).   At 1st visit on 03/08/2024: - Referred to Urology by Nephrology for concerns of incomplete bladder emptying as possible contributing factor for UTI risk and CKD.  - PVR = 389 ml. Reported urinating about 3-4 hours prior to arrival but was unable to void in office today. Unable to obtain UA.  - We considered possible etiologies of her intermittent (?) incomplete bladder emptying including but not limited to: neurogenic bladder, tight TVT sling, voiding dysfunction secondary to high-tone pelvic floor dysfunction and/or constipation, etc. Low concern at this time since she has had no bladder discomfort, only 1 UTI in the past 12 months, and no hydronephrosis on RUS done 11/26/2023. Today's PVR is reasonable based on her reported last void being >3 hours prior. We discussed potential next steps such as observation, clean intermittent catheterization (CIC), indwelling Foley catheter placement, urodynamic testing, cystoscopy, etc. After a detailed discussion regarding potential risks and benefits, we agreed to proceed cautiously with observation and to follow up in late June 2025 with renal US  to recheck for hydronephrosis.  Since last visit: > 05/16/2024: RUS showed no stones, masses, or hydronephrosis.   Today: She reports 0 UTIs since last visit. She reports that at baseline she has urinary urgency. Denies frequency, dysuria, gross hematuria, hesitancy, or sensations of incomplete emptying. Reports straining to void sometimes. She denies flank pain or abdominal pain.   Medications: Current Outpatient Medications  Medication Sig Dispense Refill   atorvastatin  (LIPITOR) 10 MG tablet Take 1 tablet (10 mg total) by mouth at bedtime. 90  tablet 2   Calcium  Carb-Cholecalciferol 600-400 MG-UNIT TABS Take 1 tablet by mouth daily.      Cranberry 500 MG CAPS Take by mouth.     levothyroxine  (SYNTHROID ) 75 MCG tablet Take 1 tablet (75 mcg total) by mouth daily. 90 tablet 1   lisinopril -hydrochlorothiazide  (ZESTORETIC ) 20-25 MG tablet Take 1 tablet by mouth daily. 90 tablet 1   Multiple Vitamin (MULTIVITAMIN) tablet Take 1 tablet by mouth daily.     No current facility-administered medications for this visit.    Allergies: Allergies  Allergen Reactions   Penicillins Rash    Did it involve swelling of the face/tongue/throat, SOB, or low BP? Unknown Did it involve sudden or severe rash/hives, skin peeling, or any reaction on the inside of your mouth or nose? Letha Did you need to seek medical attention at a hospital or doctor's office? No When did it last happen?  childhood If all above answers are "NO", may proceed with cephalosporin use.    Sulfa Antibiotics Rash    Past Medical History:  Diagnosis Date   Cataract    Cataracts, bilateral    Hyperlipidemia    Hypertension    Incarcerated umbilical hernia 06/21/2019   Thyroid  disease    Past Surgical History:  Procedure Laterality Date   ABDOMINAL HYSTERECTOMY     APPENDECTOMY     BLADDER SURGERY     EYE SURGERY     Cataracts   HERNIA REPAIR     UMBILICAL HERNIA REPAIR N/A 06/22/2019   Procedure: HERNIA REPAIR UMBILICAL ADULT;  Surgeon: Mavis Anes, MD;  Location: [facility];  Service: General;  Laterality: N/A;   Family History  Problem Relation Age of Onset  Stroke Mother    Heart disease Mother    Heart disease Father    Stroke Father    Bone cancer Father    Cervical cancer Sister    Aneurysm Brother    Social History   Socioeconomic History   Marital status: Widowed    Spouse name: Not on file   Number of children: 1   Years of education: 56   Highest education level: Not on file  Occupational History   Occupation: Retired  Tobacco Use    Smoking status: Never   Smokeless tobacco: Never  Vaping Use   Vaping status: Never Used  Substance and Sexual Activity   Alcohol use: Never   Drug use: Never   Sexual activity: Not Currently  Other Topics Concern   Not on file  Social History Narrative   Lives alone - duplex - only 2 stairs to come in   Neighbor is handicap, so she mows his yard and helps him when needed   Very independent - no cane or walker    Daughter lives 5 minutes away   Social Drivers of Health   Financial Resource Strain: Low Risk  (01/11/2024)   Overall Financial Resource Strain (CARDIA)    Difficulty of Paying Living Expenses: Not hard at all  Food Insecurity: No Food Insecurity (01/11/2024)   Hunger Vital Sign    Worried About Running Out of Food in the Last Year: Never true    Ran Out of Food in the Last Year: Never true  Transportation Needs: No Transportation Needs (01/11/2024)   PRAPARE - Administrator, Civil Service (Medical): No    Lack of Transportation (Non-Medical): No  Physical Activity: Sufficiently Active (01/11/2024)   Exercise Vital Sign    Days of Exercise per Week: 5 days    Minutes of Exercise per Session: 30 min  Stress: No Stress Concern Present (01/11/2024)   [employer] of Occupational Health - Occupational Stress Questionnaire    Feeling of Stress : Not at all  Social Connections: Moderately Isolated (01/11/2024)   Social Connection and Isolation Panel    Frequency of Communication with Friends and Family: More than three times a week    Frequency of Social Gatherings with Friends and Family: Three times a week    Attends Religious Services: More than 4 times per year    Active Member of Clubs or Organizations: No    Attends Banker Meetings: Never    Marital Status: Widowed  Intimate Partner Violence: Not At Risk (01/11/2024)   Humiliation, Afraid, Rape, and Kick questionnaire    Fear of Current or Ex-Partner: No    Emotionally Abused: No     Physically Abused: No    Sexually Abused: No    Review of Systems Constitutional: Patient denies any unintentional weight loss or change in strength lntegumentary: Patient denies any rashes or pruritus Cardiovascular: Patient denies chest pain or syncope Respiratory: Patient denies shortness of breath Gastrointestinal: Patient denies nausea, vomiting, constipation, or diarrhea  Musculoskeletal: Patient denies muscle cramps or weakness Neurologic: Patient denies convulsions or seizures Allergic/Immunologic: Patient denies recent allergic reaction(s) Hematologic/Lymphatic: Patient denies bleeding tendencies Endocrine: Patient denies heat/cold intolerance  GU: As per HPI.  OBJECTIVE Vitals:   06/14/24 1430  BP: 119/66  Pulse: 76   There is no height or weight on file to calculate BMI.  Physical Examination Constitutional: No obvious distress; patient is non-toxic appearing  Cardiovascular: No visible lower extremity edema.  Respiratory: The  patient does not have audible wheezing/stridor; respirations do not appear labored  Gastrointestinal: Abdomen non-distended Musculoskeletal: Normal ROM of UEs  Skin: No obvious rashes/open sores  Neurologic: CN 2-12 grossly intact Psychiatric: Answered questions appropriately with normal affect  Hematologic/Lymphatic/Immunologic: No obvious bruises or sites of spontaneous bleeding  UA: Patient unable to void in office today  PVR: 366 ml  ASSESSMENT Incomplete bladder emptying - Plan: Urinalysis, Routine w reflex microscopic, BLADDER SCAN AMB NON-IMAGING, US  RENAL  History of UTI - Plan: Urinalysis, Routine w reflex microscopic  Stage 3 chronic kidney disease, unspecified whether stage 3a or 3b CKD (HCC) - Plan: Urinalysis, Routine w reflex microscopic, US  RENAL  We reviewed our previous discussion of possible etiologies of her intermittent (?) incomplete bladder emptying including but not limited to: neurogenic bladder, tight TVT sling,  voiding dysfunction secondary to high-tone pelvic floor dysfunction and/or constipation, etc.   We reviewed options further management if needed such as clean intermittent catheterization (CIC), indwelling Foley catheter placement, urodynamic testing, cystoscopy, etc.   Ultimately we agreed to continue with observation since she is asymptomatic and has had no UTIs or hydronephrosis secondary to her persistent incomplete bladder emptying. No evidence that her incomplete bladder emptying is contributing to her CKD.  We agreed to plan for follow up in another 6 months with repeat renal US  to continue surveillance for hydronephrosis, or sooner if needed such as if she develops UTI symptom.  Patient and daughter verbalized understanding of and agreement with current plan. All questions were answered.  PLAN Advised the following: 1. Return in about 1 year (around 06/14/2025) for incomplete bladder emptying, with UA & PVR, will need RUS prior.  Orders Placed This Encounter  Procedures   US  RENAL    Standing Status:   Future    Expected Date:   12/15/2024    Expiration Date:   06/14/2025    Reason for Exam (SYMPTOM  OR DIAGNOSIS REQUIRED):   kidney stone known or suspected    Preferred imaging location?:   [facility]   Urinalysis, Routine w reflex microscopic   BLADDER SCAN AMB NON-IMAGING    It has been explained that the patient is to follow regularly with their PCP in addition to all other providers involved in their care and to follow instructions provided by these respective offices. Patient advised to contact urology clinic if any urologic-pertaining questions, concerns, new symptoms or problems arise in the interim period.  There are no Patient Instructions on file for this visit.  Electronically signed by:  Lauraine JAYSON Oz, FNP   06/14/24    2:56 PM

## 2024-06-14 ENCOUNTER — Ambulatory Visit: Admitting: Urology

## 2024-06-14 ENCOUNTER — Encounter: Payer: Self-pay | Admitting: Urology

## 2024-06-14 VITALS — BP 119/66 | HR 76

## 2024-06-14 DIAGNOSIS — N183 Chronic kidney disease, stage 3 unspecified: Secondary | ICD-10-CM | POA: Diagnosis not present

## 2024-06-14 DIAGNOSIS — Z87440 Personal history of urinary (tract) infections: Secondary | ICD-10-CM

## 2024-06-14 DIAGNOSIS — R339 Retention of urine, unspecified: Secondary | ICD-10-CM | POA: Diagnosis not present

## 2024-06-14 LAB — BLADDER SCAN AMB NON-IMAGING: Scan Result: 366

## 2024-06-14 NOTE — Addendum Note (Signed)
 Addended by: GRETTA MASTERS R on: 06/14/2024 04:06 PM   Modules accepted: Orders

## 2024-08-12 ENCOUNTER — Ambulatory Visit (INDEPENDENT_AMBULATORY_CARE_PROVIDER_SITE_OTHER): Admitting: Family Medicine

## 2024-08-12 ENCOUNTER — Encounter: Payer: Self-pay | Admitting: Family Medicine

## 2024-08-12 VITALS — BP 128/64 | HR 78 | Temp 97.8°F | Ht 66.0 in | Wt 164.4 lb

## 2024-08-12 DIAGNOSIS — N1831 Chronic kidney disease, stage 3a: Secondary | ICD-10-CM

## 2024-08-12 DIAGNOSIS — E782 Mixed hyperlipidemia: Secondary | ICD-10-CM | POA: Diagnosis not present

## 2024-08-12 DIAGNOSIS — I10 Essential (primary) hypertension: Secondary | ICD-10-CM | POA: Diagnosis not present

## 2024-08-12 DIAGNOSIS — E039 Hypothyroidism, unspecified: Secondary | ICD-10-CM

## 2024-08-12 DIAGNOSIS — Z23 Encounter for immunization: Secondary | ICD-10-CM

## 2024-08-12 DIAGNOSIS — I5032 Chronic diastolic (congestive) heart failure: Secondary | ICD-10-CM | POA: Insufficient documentation

## 2024-08-12 MED ORDER — LISINOPRIL-HYDROCHLOROTHIAZIDE 20-25 MG PO TABS: 1.0000 | ORAL_TABLET | Freq: Every day | ORAL | 1 refills | Status: AC

## 2024-08-12 MED ORDER — LEVOTHYROXINE SODIUM 75 MCG PO TABS: 75.0000 ug | ORAL_TABLET | Freq: Every day | ORAL | 1 refills | Status: AC

## 2024-08-12 NOTE — Patient Instructions (Signed)

## 2024-08-12 NOTE — Progress Notes (Signed)
 Subjective:  Patient ID: Hannah [name], female    DOB: 07/12/1941, 83 y.o.   MRN: [id number]  Patient Care Team: Severa Rock HERO, FNP as PCP - General (Family Medicine) Dr Will[facility], OD (Optometry)   Chief Complaint:  Hypertension (4-6 month follow up )   HPI: Hannah [name] is a 83 y.o. female presenting on 08/12/2024 for Hypertension (4-6 month follow up )   Hannah [name] is an 83 year old female with hypertension who presents for a routine follow-up visit.  Her blood pressure has been stable at home, although it tends to rise during office visits. The patient reports that her kidney specialist, Lauraine, told her that her blood pressure is usually low. She has made significant dietary changes, reducing salt intake and cutting back on fried foods, bacon, and sausage, which has resulted in weight loss.  She recently had an ultrasound, but the reason for her urinary retention remains unclear. The patient reports that her kidney specialist told her that after her bladder surgery, things were 'a little bit too tight,' which may be why she cannot fully empty her bladder. She is scheduled for a follow-up in six months.  No issues with her thyroid , including changes in hair, skin, or nails. She confirms she is taking her medication as prescribed.  No changes in vision, headaches, chest pain, or leg swelling. She keeps her mind active by reading and doing crosswords daily.  She spends time daily with her sister, who is in hospice care, and helps with her care. Her sister is dying of cancer.          Relevant past medical, surgical, family, and social history reviewed and updated as indicated.  Allergies and medications reviewed and updated. Data reviewed: Chart in Epic.   Past Medical History:  Diagnosis Date   Cataract    Cataracts, bilateral    Hyperlipidemia    Hypertension    Incarcerated umbilical hernia 06/21/2019   Thyroid  disease     Past Surgical  History:  Procedure Laterality Date   ABDOMINAL HYSTERECTOMY     APPENDECTOMY     BLADDER SURGERY     EYE SURGERY     Cataracts   HERNIA REPAIR     UMBILICAL HERNIA REPAIR N/A 06/22/2019   Procedure: HERNIA REPAIR UMBILICAL ADULT;  Surgeon: Mavis Anes, MD;  Location: [facility];  Service: General;  Laterality: N/A;    Social History   Socioeconomic History   Marital status: Widowed    Spouse name: Not on file   Number of children: 1   Years of education: 38   Highest education level: Not on file  Occupational History   Occupation: Retired  Tobacco Use   Smoking status: Never   Smokeless tobacco: Never  Vaping Use   Vaping status: Never Used  Substance and Sexual Activity   Alcohol use: Never   Drug use: Never   Sexual activity: Not Currently  Other Topics Concern   Not on file  Social History Narrative   Lives alone - duplex - only 2 stairs to come in   Neighbor is handicap, so she mows his yard and helps him when needed   Very independent - no cane or walker    Daughter lives 5 minutes away   Social Drivers of Health   Financial Resource Strain: Low Risk  (01/11/2024)   Overall Financial Resource Strain (CARDIA)    Difficulty of Paying Living Expenses: Not hard at all  Food Insecurity: No Food Insecurity (01/11/2024)   Hunger Vital Sign    Worried About Running Out of Food in the Last Year: Never true    Ran Out of Food in the Last Year: Never true  Transportation Needs: No Transportation Needs (01/11/2024)   PRAPARE - Administrator, Civil Service (Medical): No    Lack of Transportation (Non-Medical): No  Physical Activity: Sufficiently Active (01/11/2024)   Exercise Vital Sign    Days of Exercise per Week: 5 days    Minutes of Exercise per Session: 30 min  Stress: No Stress Concern Present (01/11/2024)   [employer] of Occupational Health - Occupational Stress Questionnaire    Feeling of Stress : Not at all  Social Connections: Moderately  Isolated (01/11/2024)   Social Connection and Isolation Panel    Frequency of Communication with Friends and Family: More than three times a week    Frequency of Social Gatherings with Friends and Family: Three times a week    Attends Religious Services: More than 4 times per year    Active Member of Clubs or Organizations: No    Attends Banker Meetings: Never    Marital Status: Widowed  Intimate Partner Violence: Not At Risk (01/11/2024)   Humiliation, Afraid, Rape, and Kick questionnaire    Fear of Current or Ex-Partner: No    Emotionally Abused: No    Physically Abused: No    Sexually Abused: No    Outpatient Encounter Medications as of 08/12/2024  Medication Sig   atorvastatin  (LIPITOR) 10 MG tablet Take 1 tablet (10 mg total) by mouth at bedtime.   Calcium  Carb-Cholecalciferol 600-400 MG-UNIT TABS Take 1 tablet by mouth daily.    Cranberry 500 MG CAPS Take by mouth.   Multiple Vitamin (MULTIVITAMIN) tablet Take 1 tablet by mouth daily.   levothyroxine  (SYNTHROID ) 75 MCG tablet Take 1 tablet (75 mcg total) by mouth daily.   lisinopril -hydrochlorothiazide  (ZESTORETIC ) 20-25 MG tablet Take 1 tablet by mouth daily.   [DISCONTINUED] levothyroxine  (SYNTHROID ) 75 MCG tablet Take 1 tablet (75 mcg total) by mouth daily.   [DISCONTINUED] lisinopril -hydrochlorothiazide  (ZESTORETIC ) 20-25 MG tablet Take 1 tablet by mouth daily.   No facility-administered encounter medications on file as of 08/12/2024.    Allergies  Allergen Reactions   Penicillins Rash    Did it involve swelling of the face/tongue/throat, SOB, or low BP? Unknown Did it involve sudden or severe rash/hives, skin peeling, or any reaction on the inside of your mouth or nose? Letha Did you need to seek medical attention at a hospital or doctor's office? No When did it last happen?  childhood If all above answers are "NO", may proceed with cephalosporin use.    Sulfa Antibiotics Rash    Pertinent ROS per  HPI, otherwise unremarkable      Objective:  BP 128/64 (BP Location: Right Arm, Cuff Size: Normal)   Pulse 78   Temp 97.8 F (36.6 C)   Ht 5' 6 (1.676 m)   Wt 164 lb 6.4 oz (74.6 kg)   SpO2 98%   BMI 26.53 kg/m    Wt Readings from Last 3 Encounters:  08/12/24 164 lb 6.4 oz (74.6 kg)  03/11/24 169 lb 9.6 oz (76.9 kg)  02/25/24 170 lb (77.1 kg)    Physical Exam Vitals and nursing note reviewed.  Constitutional:      Appearance: Normal appearance.  HENT:     Head: Normocephalic and atraumatic.     Nose: Nose  normal.     Mouth/Throat:     Mouth: Mucous membranes are moist.  Eyes:     Conjunctiva/sclera: Conjunctivae normal.     Pupils: Pupils are equal, round, and reactive to light.     Comments: Left eye extrophia   Cardiovascular:     Rate and Rhythm: Normal rate and regular rhythm.     Heart sounds: Normal heart sounds.  Pulmonary:     Effort: Pulmonary effort is normal.     Breath sounds: Normal breath sounds.  Musculoskeletal:     Cervical back: Neck supple.     Right lower leg: No edema.     Left lower leg: No edema.     Comments: Kyphosis  Skin:    General: Skin is warm and dry.     Capillary Refill: Capillary refill takes less than 2 seconds.  Neurological:     General: No focal deficit present.     Mental Status: She is alert and oriented to person, place, and time.     Comments: Stuttering at times - chronic  Psychiatric:        Mood and Affect: Mood normal.        Behavior: Behavior normal.        Thought Content: Thought content normal.        Judgment: Judgment normal.      Results for orders placed or performed in visit on 06/14/24  BLADDER SCAN AMB NON-IMAGING   Collection Time: 06/14/24  2:29 PM  Result Value Ref Range   Scan Result 366        Pertinent labs & imaging results that were available during my care of the patient were reviewed by me and considered in my medical decision making.  Assessment & Plan:  Mileena was seen today  for hypertension.  Diagnoses and all orders for this visit:  Mixed hyperlipidemia -     CMP14+EGFR -     Lipid panel -     Thyroid  Panel With TSH  Essential hypertension -     CMP14+EGFR -     Lipid panel -     lisinopril -hydrochlorothiazide  (ZESTORETIC ) 20-25 MG tablet; Take 1 tablet by mouth daily. -     Thyroid  Panel With TSH  Acquired hypothyroidism -     Lipid panel -     levothyroxine  (SYNTHROID ) 75 MCG tablet; Take 1 tablet (75 mcg total) by mouth daily. -     Thyroid  Panel With TSH  Stage 3a chronic kidney disease (HCC) -     CMP14+EGFR  Encounter for vaccination -     Flu vaccine HIGH DOSE PF(Fluzone Trivalent)       Chronic kidney disease, stage 3a Chronic kidney disease stage 3a with ongoing bladder function issues. Recent ultrasound performed, etiology of urinary retention remains unclear. Possible tight bladder post-surgery contributing to urinary retention. - Nephrologist follow-up in six months  Essential hypertension Essential hypertension well-controlled with dietary modifications. Blood pressure today is 128/64 mmHg. Significant dietary changes include reduced salt and fried food intake, positively impacting blood pressure and weight. - Order lab work to update - Send medication refills - Continue dietary modifications  Hypothyroidism Hypothyroidism well-managed with current medication regimen. No symptoms such as changes in hair, skin, or nails. - Continue current thyroid  medication regimen        Continue all other maintenance medications.  Follow up plan: Return in about 6 months (around 02/09/2025), or if symptoms worsen or fail to improve, for Annual Physical.  Continue healthy lifestyle choices, including diet (rich in fruits, vegetables, and lean proteins, and low in salt and simple carbohydrates) and exercise (at least 30 minutes of moderate physical activity daily).  Educational handout given for DASH diet, HTN  The above assessment  and management plan was discussed with the patient. The patient verbalized understanding of and has agreed to the management plan. Patient is aware to call the clinic if they develop any new symptoms or if symptoms persist or worsen. Patient is aware when to return to the clinic for a follow-up visit. Patient educated on when it is appropriate to go to the emergency department.   Rosaline Bruns, FNP-C [facility] [phone]

## 2024-08-13 ENCOUNTER — Ambulatory Visit: Payer: Self-pay | Admitting: Family Medicine

## 2024-08-13 LAB — THYROID PANEL WITH TSH
Free Thyroxine Index: 2.4 (ref 1.2–4.9)
T3 Uptake Ratio: 29 % (ref 24–39)
T4, Total: 8.2 ug/dL (ref 4.5–12.0)
TSH: 2.48 u[IU]/mL (ref 0.450–4.500)

## 2024-08-13 LAB — CMP14+EGFR
ALT: 14 IU/L (ref 0–32)
AST: 22 IU/L (ref 0–40)
Albumin: 4.3 g/dL (ref 3.7–4.7)
Alkaline Phosphatase: 76 IU/L (ref 44–121)
BUN/Creatinine Ratio: 14 (ref 12–28)
BUN: 16 mg/dL (ref 8–27)
Bilirubin Total: 0.6 mg/dL (ref 0.0–1.2)
CO2: 24 mmol/L (ref 20–29)
Calcium: 9.3 mg/dL (ref 8.7–10.3)
Chloride: 101 mmol/L (ref 96–106)
Creatinine, Ser: 1.18 mg/dL — ABNORMAL HIGH (ref 0.57–1.00)
Globulin, Total: 2.6 g/dL (ref 1.5–4.5)
Glucose: 99 mg/dL (ref 70–99)
Potassium: 3.8 mmol/L (ref 3.5–5.2)
Sodium: 141 mmol/L (ref 134–144)
Total Protein: 6.9 g/dL (ref 6.0–8.5)
eGFR: 46 mL/min/1.73 — ABNORMAL LOW (ref >59)

## 2024-08-13 LAB — LIPID PANEL
Chol/HDL Ratio: 3 ratio (ref 0.0–4.4)
Cholesterol, Total: 129 mg/dL (ref 100–199)
HDL: 43 mg/dL (ref >39)
LDL Chol Calc (NIH): 64 mg/dL (ref 0–99)
Triglycerides: 122 mg/dL (ref 0–149)
VLDL Cholesterol Cal: 22 mg/dL (ref 5–40)

## 2024-08-15 ENCOUNTER — Telehealth: Payer: Self-pay

## 2024-08-15 DIAGNOSIS — I5032 Chronic diastolic (congestive) heart failure: Secondary | ICD-10-CM | POA: Diagnosis not present

## 2024-08-15 DIAGNOSIS — R809 Proteinuria, unspecified: Secondary | ICD-10-CM | POA: Diagnosis not present

## 2024-08-15 DIAGNOSIS — I129 Hypertensive chronic kidney disease with stage 1 through stage 4 chronic kidney disease, or unspecified chronic kidney disease: Secondary | ICD-10-CM | POA: Diagnosis not present

## 2024-08-15 NOTE — Telephone Encounter (Signed)
 noted    Copied from CRM #[id number]. Topic: Clinical - Lab/Test Results >> Aug 15, 2024 11:43 AM Delon DASEN wrote: Reason for CRM: read results verbatim, no questions

## 2024-12-05 ENCOUNTER — Ambulatory Visit (HOSPITAL_COMMUNITY): Attending: Urology

## 2024-12-15 ENCOUNTER — Ambulatory Visit: Admitting: Urology

## 2025-01-11 ENCOUNTER — Ambulatory Visit: Payer: Medicare Other

## 2025-02-03 ENCOUNTER — Encounter: Payer: Self-pay | Admitting: Family Medicine
